# Patient Record
Sex: Male | Born: 1935 | Race: White | Hispanic: No | Smoking: Former smoker
Health system: Southern US, Community
[De-identification: ages and names within clinical notes are randomized; demographics above are authoritative.]

## PROBLEM LIST (undated history)

## (undated) DIAGNOSIS — I10 Essential (primary) hypertension: Secondary | ICD-10-CM

## (undated) DIAGNOSIS — C801 Malignant (primary) neoplasm, unspecified: Secondary | ICD-10-CM

## (undated) DIAGNOSIS — I219 Acute myocardial infarction, unspecified: Secondary | ICD-10-CM

## (undated) DIAGNOSIS — I739 Peripheral vascular disease, unspecified: Secondary | ICD-10-CM

## (undated) DIAGNOSIS — K819 Cholecystitis, unspecified: Secondary | ICD-10-CM

## (undated) DIAGNOSIS — K219 Gastro-esophageal reflux disease without esophagitis: Secondary | ICD-10-CM

## (undated) DIAGNOSIS — T8859XA Other complications of anesthesia, initial encounter: Secondary | ICD-10-CM

## (undated) DIAGNOSIS — M1990 Unspecified osteoarthritis, unspecified site: Secondary | ICD-10-CM

## (undated) DIAGNOSIS — T4145XA Adverse effect of unspecified anesthetic, initial encounter: Secondary | ICD-10-CM

## (undated) DIAGNOSIS — E119 Type 2 diabetes mellitus without complications: Secondary | ICD-10-CM

## (undated) HISTORY — DX: Gastro-esophageal reflux disease without esophagitis: K21.9

## (undated) HISTORY — DX: Other complications of anesthesia, initial encounter: T88.59XA

## (undated) HISTORY — DX: Acute myocardial infarction, unspecified: I21.9

## (undated) HISTORY — DX: Peripheral vascular disease, unspecified: I73.9

## (undated) HISTORY — DX: Malignant (primary) neoplasm, unspecified: C80.1

## (undated) HISTORY — DX: Unspecified osteoarthritis, unspecified site: M19.90

## (undated) HISTORY — DX: Type 2 diabetes mellitus without complications: E11.9

## (undated) HISTORY — PX: LEG AMPUTATION ABOVE KNEE: SHX117

## (undated) HISTORY — DX: Adverse effect of unspecified anesthetic, initial encounter: T41.45XA

## (undated) HISTORY — DX: Essential (primary) hypertension: I10

---

## 2014-04-16 DIAGNOSIS — I10 Essential (primary) hypertension: Secondary | ICD-10-CM | POA: Insufficient documentation

## 2014-09-15 ENCOUNTER — Encounter: Payer: Self-pay | Admitting: Podiatry

## 2014-09-15 ENCOUNTER — Ambulatory Visit (INDEPENDENT_AMBULATORY_CARE_PROVIDER_SITE_OTHER): Payer: Medicare Other | Admitting: Podiatry

## 2014-09-15 VITALS — BP 139/85 | HR 102 | Ht 70.0 in | Wt 195.0 lb

## 2014-09-15 DIAGNOSIS — L97402 Non-pressure chronic ulcer of unspecified heel and midfoot with fat layer exposed: Secondary | ICD-10-CM | POA: Diagnosis not present

## 2014-09-15 DIAGNOSIS — L089 Local infection of the skin and subcutaneous tissue, unspecified: Secondary | ICD-10-CM

## 2014-09-15 DIAGNOSIS — M79673 Pain in unspecified foot: Secondary | ICD-10-CM

## 2014-09-15 DIAGNOSIS — M2010 Hallux valgus (acquired), unspecified foot: Secondary | ICD-10-CM | POA: Diagnosis not present

## 2014-09-15 DIAGNOSIS — M21619 Bunion of unspecified foot: Secondary | ICD-10-CM | POA: Insufficient documentation

## 2014-09-15 DIAGNOSIS — L97409 Non-pressure chronic ulcer of unspecified heel and midfoot with unspecified severity: Secondary | ICD-10-CM | POA: Insufficient documentation

## 2014-09-15 MED ORDER — CEPHALEXIN 500 MG PO CAPS: 500.0000 mg | ORAL_CAPSULE | Freq: Three times a day (TID) | ORAL | Status: DC

## 2014-09-15 NOTE — Progress Notes (Signed)
Subjective: 81 year old male presents with painful callus under right foot. Stated that his blood sugar stays under 120.  He has had callus all his life. Then start having pain under right great toe joint x 4 days.  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 Lost 3rd digit last year and 2 years ago the 2nd toe right foot.   Objective: Dermatologic: Ulcerated and draining callus under 1st MPJ right foot. Surrounding callus is soft white about 2.5 cm in diameter. Center opening is about 1cm in diameter.  Vascular: right foot faintly palpable DP and PT, left foot not palpable and foot is cold.  Increased redness and temperature surrounding the right foot lesion.  Neurologic: All epicritic and tactile sensations grossly intact. 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 Lost 3rd digit right last year and 2nd digit 2 years ago.  Severe Hallux valgus with bunion on both feet.  Assessment: Infected and ulcerated callus on right first MPJ.   Plan: Debrided infected callus. Aperture pad applied. Betadine wet to dry dressing applied. Patient is to do daily dressing change with Amerigel ointment.  Keflex 500mg  tid.  Return next week.

## 2014-09-15 NOTE — Patient Instructions (Signed)
Seen for right foot lesion. Noted of infected ulcer draining at the right great toe joint. Area debrided and padded. Do apply antibiotics daily and return in one week.

## 2014-09-22 ENCOUNTER — Ambulatory Visit (INDEPENDENT_AMBULATORY_CARE_PROVIDER_SITE_OTHER): Payer: Medicare Other | Admitting: Podiatry

## 2014-09-22 ENCOUNTER — Encounter: Payer: Self-pay | Admitting: Podiatry

## 2014-09-22 VITALS — BP 134/78 | HR 96

## 2014-09-22 DIAGNOSIS — M21611 Bunion of right foot: Secondary | ICD-10-CM

## 2014-09-22 DIAGNOSIS — L97412 Non-pressure chronic ulcer of right heel and midfoot with fat layer exposed: Secondary | ICD-10-CM | POA: Diagnosis not present

## 2014-09-22 DIAGNOSIS — M2011 Hallux valgus (acquired), right foot: Secondary | ICD-10-CM | POA: Diagnosis not present

## 2014-09-22 NOTE — Patient Instructions (Signed)
Seen for right foot ulcer. Wound debrided and padded.  Keep the pad on till the day before the appointment and take good bath on Sunday nite. Return in one week.

## 2014-09-22 NOTE — Progress Notes (Signed)
Subjective: 81 year old male presents for the follow up on right foot ulcer. He has kept the dressing intact till today.  Objective: Dermatologic: 1st MPJ right foot ulcer is decreased in size. The center opening is about 0.5cm in diameter with surrounding dry hard calluses. Center is filled with devitalized tissue.  No redness or edema associated with the wound.  Vascular: right foot faintly palpable DP and PT, left foot not palpable and foot is cold.  Increased redness and temperature surrounding the right foot lesion.  Neurologic: All epicritic and tactile sensations grossly intact. 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 Lost 3rd digit right last year and 2nd digit 2 years ago.  Severe Hallux valgus with bunion on both feet.  Assessment: Improved ulcer right first MPJ.   Plan: Debrided devitalized tissue surrounding the open lesion.  Aperture pad applied. Return next week.

## 2014-09-29 ENCOUNTER — Encounter: Payer: Self-pay | Admitting: Podiatry

## 2014-09-29 ENCOUNTER — Ambulatory Visit (INDEPENDENT_AMBULATORY_CARE_PROVIDER_SITE_OTHER): Payer: Medicare Other | Admitting: Podiatry

## 2014-09-29 VITALS — BP 134/74 | HR 101

## 2014-09-29 DIAGNOSIS — L97401 Non-pressure chronic ulcer of unspecified heel and midfoot limited to breakdown of skin: Secondary | ICD-10-CM

## 2014-09-29 DIAGNOSIS — M2011 Hallux valgus (acquired), right foot: Secondary | ICD-10-CM

## 2014-09-29 DIAGNOSIS — M21611 Bunion of right foot: Secondary | ICD-10-CM

## 2014-09-29 NOTE — Patient Instructions (Signed)
Seen for follow up on right great toe ulcer. Slow healing. Continue daily dressing change,. Return in 2 weeks. Remove all bandage two days before the next appointment.

## 2014-09-29 NOTE — Progress Notes (Signed)
Subjective: 81 year old male presents for the follow up on right foot ulcer. Stated that he has followed the instruction.   Objective: Dermatologic: 1st MPJ right foot ulcer show no improvement. The center opening is about 0.5cm in diameter with surrounding dry hard calluses. Center is filled with devitalized tissue. No redness or edema associated with the wound.  Vascular: right foot faintly palpable DP and PT, left foot not palpable and foot is cold.  Increased redness and temperature surrounding the right foot lesion.  Neurologic: All epicritic and tactile sensations grossly intact. 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 Lost 3rd digit right last year and 2nd digit 2 years ago.  Severe Hallux valgus with bunion on both feet.  Assessment: No change in ulcer size since last visit, right first MPJ.   Plan: Debrided devitalized tissue surrounding the open lesion.  Aperture pad applied. Return in 2 weeks.

## 2014-10-13 ENCOUNTER — Encounter: Payer: Self-pay | Admitting: Podiatry

## 2014-10-13 ENCOUNTER — Ambulatory Visit (INDEPENDENT_AMBULATORY_CARE_PROVIDER_SITE_OTHER): Payer: Medicare Other | Admitting: Podiatry

## 2014-10-13 VITALS — BP 140/80 | HR 81

## 2014-10-13 DIAGNOSIS — I70209 Unspecified atherosclerosis of native arteries of extremities, unspecified extremity: Secondary | ICD-10-CM

## 2014-10-13 DIAGNOSIS — L98499 Non-pressure chronic ulcer of skin of other sites with unspecified severity: Secondary | ICD-10-CM | POA: Diagnosis not present

## 2014-10-13 DIAGNOSIS — L97402 Non-pressure chronic ulcer of unspecified heel and midfoot with fat layer exposed: Secondary | ICD-10-CM | POA: Diagnosis not present

## 2014-10-13 DIAGNOSIS — I739 Peripheral vascular disease, unspecified: Secondary | ICD-10-CM | POA: Diagnosis not present

## 2014-10-13 NOTE — Patient Instructions (Signed)
Seen for right foot ulcer. Slow healing wound. Return in 2 weeks.

## 2014-10-13 NOTE — Progress Notes (Signed)
Subjective: 81 year old male presents for the follow up on right foot ulcer. Stated that he has followed the instruction.  Stated that the foot is less painful.   Objective: Dermatologic: 1st MPJ right foot ulcer show slow improvement.  The center opening is about 0.4 cm in diameter with surrounding dry hard calluses. Center is filled with devitalized tissue. No redness or edema associated with the wound.  Vascular: right foot faintly palpable DP and PT, left foot not palpable and foot is cold.  Increased redness and temperature surrounding the right foot lesion.  Neurologic: All epicritic and tactile sensations grossly intact. 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 Lost 3rd digit right last year and 2nd digit 2 years ago.  Severe Hallux valgus with bunion on both feet.  Assessment: Small improvement in ulcer size since last visit, right first MPJ.   Plan: Debrided devitalized tissue surrounding the open lesion.  Aperture pad applied. Return in 2 weeks.

## 2014-10-27 ENCOUNTER — Encounter: Payer: Self-pay | Admitting: Podiatry

## 2014-10-27 ENCOUNTER — Ambulatory Visit (INDEPENDENT_AMBULATORY_CARE_PROVIDER_SITE_OTHER): Payer: Medicare Other | Admitting: Podiatry

## 2014-10-27 VITALS — BP 132/75 | HR 90

## 2014-10-27 DIAGNOSIS — I739 Peripheral vascular disease, unspecified: Secondary | ICD-10-CM

## 2014-10-27 DIAGNOSIS — I70209 Unspecified atherosclerosis of native arteries of extremities, unspecified extremity: Secondary | ICD-10-CM

## 2014-10-27 DIAGNOSIS — L97402 Non-pressure chronic ulcer of unspecified heel and midfoot with fat layer exposed: Secondary | ICD-10-CM

## 2014-10-27 DIAGNOSIS — L98499 Non-pressure chronic ulcer of skin of other sites with unspecified severity: Secondary | ICD-10-CM

## 2014-10-27 NOTE — Patient Instructions (Signed)
Seen for right foot ulcer. Continue do daily dressing change. Return in 2 weeks.

## 2014-10-27 NOTE — Progress Notes (Signed)
Subjective: 81 year old male presents for the follow up on right foot ulcer. Stated that he has followed the instruction and removed padding last week.   Objective: Dermatologic: 1st MPJ right foot ulcer show slow improvement.  The center opening is about 0.4 cm in diameter with surrounding dry hard calluses. Center is filled with devitalized tissue. No redness or edema associated with the wound.  Vascular: right foot faintly palpable DP and PT, left foot not palpable and foot is cold.  Increased redness and temperature surrounding the right foot lesion.  Neurologic: All epicritic and tactile sensations grossly intact. 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 Lost 3rd digit right last year and 2nd digit 2 years ago.  Severe Hallux valgus with bunion on both feet.  Assessment: Small improvement in ulcer size since last visit, right first MPJ.   Plan: Debrided devitalized tissue surrounding the open lesion.  Aperture pad applied. Return in 2 weeks.

## 2014-11-10 ENCOUNTER — Encounter: Payer: Self-pay | Admitting: Podiatry

## 2014-11-10 ENCOUNTER — Ambulatory Visit (INDEPENDENT_AMBULATORY_CARE_PROVIDER_SITE_OTHER): Payer: Medicare Other | Admitting: Podiatry

## 2014-11-10 VITALS — BP 125/73 | HR 97

## 2014-11-10 DIAGNOSIS — L98499 Non-pressure chronic ulcer of skin of other sites with unspecified severity: Secondary | ICD-10-CM | POA: Diagnosis not present

## 2014-11-10 DIAGNOSIS — I739 Peripheral vascular disease, unspecified: Secondary | ICD-10-CM

## 2014-11-10 DIAGNOSIS — L97401 Non-pressure chronic ulcer of unspecified heel and midfoot limited to breakdown of skin: Secondary | ICD-10-CM

## 2014-11-10 DIAGNOSIS — I70209 Unspecified atherosclerosis of native arteries of extremities, unspecified extremity: Secondary | ICD-10-CM

## 2014-11-10 NOTE — Patient Instructions (Signed)
Follow up on right foot lesion.  Slow healing ulcer.  Area debrided and padded. Return in 2 weeks.

## 2014-11-10 NOTE — Progress Notes (Signed)
Subjective: 81 year old male presents for the follow up on right foot ulcer. He is talkative for having headaches and eye problems.  Objective: Dermatologic: 1st MPJ right foot ulcer show slow improvement. Still draining in center opening which is about 0.3cm surrounded with dry calluses.  Vascular: right foot faintly palpable DP and PT, left foot not palpable and foot is cold.  Neurologic: All epicritic and tactile sensations grossly intact. 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 Lost 3rd digit right last year and 2nd digit 2 years ago.  Severe Hallux valgus with bunion on both feet.  Assessment: Slow improving ulcer, dorsomedial right first MPJ.   Plan: Debrided devitalized tissue surrounding the open lesion.  Aperture pad applied. Return in 2 weeks.

## 2014-11-24 ENCOUNTER — Ambulatory Visit: Payer: Medicare Other | Admitting: Podiatry

## 2015-03-13 ENCOUNTER — Encounter: Payer: Self-pay | Admitting: Podiatry

## 2015-03-13 ENCOUNTER — Ambulatory Visit (INDEPENDENT_AMBULATORY_CARE_PROVIDER_SITE_OTHER): Payer: Medicare Other | Admitting: Podiatry

## 2015-03-13 VITALS — BP 125/74 | HR 81

## 2015-03-13 DIAGNOSIS — L97401 Non-pressure chronic ulcer of unspecified heel and midfoot limited to breakdown of skin: Secondary | ICD-10-CM | POA: Diagnosis not present

## 2015-03-13 DIAGNOSIS — L98499 Non-pressure chronic ulcer of skin of other sites with unspecified severity: Secondary | ICD-10-CM | POA: Diagnosis not present

## 2015-03-13 DIAGNOSIS — M2011 Hallux valgus (acquired), right foot: Secondary | ICD-10-CM | POA: Diagnosis not present

## 2015-03-13 DIAGNOSIS — I739 Peripheral vascular disease, unspecified: Secondary | ICD-10-CM | POA: Diagnosis not present

## 2015-03-13 DIAGNOSIS — I70209 Unspecified atherosclerosis of native arteries of extremities, unspecified extremity: Secondary | ICD-10-CM

## 2015-03-13 DIAGNOSIS — M21611 Bunion of right foot: Secondary | ICD-10-CM

## 2015-03-13 NOTE — Progress Notes (Signed)
Subjective: 81 year old male presents with a concern on discolored toe 3rd and 5th right.  Also has had ulceration on right foot.   Objective: Dermatologic: 1st MPJ right foot ulcer remain open under callused tissue. Opening is about 0.3cm surrounded with dry calluses.  Vascular: right foot faintly palpable DP and PT, left foot not palpable and foot is cold.  Neurologic: All epicritic and tactile sensations grossly intact. 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 Lost 3rd digit right last year and 2nd digit 2 years ago.  Severe Hallux valgus with bunion on both feet.  Assessment: Bruised skin on right foot.  Chronic condition, slow improving ulcer, dorsomedial right first MPJ.   Plan: Debrided devitalized tissue surrounding the open lesion.  Aperture pad applied. Return in 3 weeks.

## 2015-03-13 NOTE — Patient Instructions (Signed)
Seen for darkened toe right foot. Noted of bruised 3rd and 5th toe right. Right first great toe joint still has opening ulcer. Debrided and padded. Return in 3 weeks.

## 2015-04-03 ENCOUNTER — Encounter: Payer: Self-pay | Admitting: Podiatry

## 2015-04-03 ENCOUNTER — Ambulatory Visit (INDEPENDENT_AMBULATORY_CARE_PROVIDER_SITE_OTHER): Payer: Medicare Other | Admitting: Podiatry

## 2015-04-03 VITALS — BP 122/77 | HR 99

## 2015-04-03 DIAGNOSIS — L97401 Non-pressure chronic ulcer of unspecified heel and midfoot limited to breakdown of skin: Secondary | ICD-10-CM | POA: Diagnosis not present

## 2015-04-03 DIAGNOSIS — I70209 Unspecified atherosclerosis of native arteries of extremities, unspecified extremity: Secondary | ICD-10-CM

## 2015-04-03 DIAGNOSIS — L98499 Non-pressure chronic ulcer of skin of other sites with unspecified severity: Secondary | ICD-10-CM | POA: Diagnosis not present

## 2015-04-03 DIAGNOSIS — I739 Peripheral vascular disease, unspecified: Secondary | ICD-10-CM | POA: Diagnosis not present

## 2015-04-03 NOTE — Progress Notes (Signed)
Subjective: 81 year old male presents for follow up on ulceration on right foot.   Objective: Dermatologic: 1st MPJ right foot ulcer remain open under callused tissue. Opening is about 0.3cm surrounded with dry calluses.  No active drainage.  Vascular: right foot faintly palpable DP and PT, left foot not palpable and foot is cold.  Neurologic: All epicritic and tactile sensations grossly intact. 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 Lost 3rd digit right last year and 2nd digit 2 years ago.  Severe Hallux valgus with bunion on both feet.  Assessment: Chronic condition, slow improving ulcer, dorsomedial right first MPJ.   Base open about 0.3cm in diameter with minimum drainage and no associated edema or erythema right foot.   Plan: Debrided devitalized tissue surrounding the open lesion.  Aperture pad applied. Return in 4 weeks.

## 2015-04-03 NOTE — Patient Instructions (Signed)
Seen for ulcerated lesion right first Metatarsal head. Debrided and padded. Return in one month.

## 2015-07-07 DIAGNOSIS — M064 Inflammatory polyarthropathy: Secondary | ICD-10-CM | POA: Insufficient documentation

## 2015-07-07 DIAGNOSIS — D126 Benign neoplasm of colon, unspecified: Secondary | ICD-10-CM | POA: Insufficient documentation

## 2015-07-07 DIAGNOSIS — I6529 Occlusion and stenosis of unspecified carotid artery: Secondary | ICD-10-CM | POA: Insufficient documentation

## 2015-07-07 DIAGNOSIS — E1142 Type 2 diabetes mellitus with diabetic polyneuropathy: Secondary | ICD-10-CM | POA: Insufficient documentation

## 2015-08-18 DIAGNOSIS — I6523 Occlusion and stenosis of bilateral carotid arteries: Secondary | ICD-10-CM | POA: Diagnosis not present

## 2015-08-19 DIAGNOSIS — Z5181 Encounter for therapeutic drug level monitoring: Secondary | ICD-10-CM | POA: Diagnosis not present

## 2015-08-19 DIAGNOSIS — Z7901 Long term (current) use of anticoagulants: Secondary | ICD-10-CM | POA: Diagnosis not present

## 2015-08-31 DIAGNOSIS — I6523 Occlusion and stenosis of bilateral carotid arteries: Secondary | ICD-10-CM | POA: Diagnosis not present

## 2015-08-31 DIAGNOSIS — Z7901 Long term (current) use of anticoagulants: Secondary | ICD-10-CM | POA: Diagnosis not present

## 2015-10-14 DIAGNOSIS — Z7901 Long term (current) use of anticoagulants: Secondary | ICD-10-CM | POA: Diagnosis not present

## 2015-10-14 DIAGNOSIS — Z5181 Encounter for therapeutic drug level monitoring: Secondary | ICD-10-CM | POA: Diagnosis not present

## 2015-10-14 DIAGNOSIS — I70221 Atherosclerosis of native arteries of extremities with rest pain, right leg: Secondary | ICD-10-CM | POA: Diagnosis not present

## 2015-10-27 DIAGNOSIS — L559 Sunburn, unspecified: Secondary | ICD-10-CM | POA: Diagnosis not present

## 2015-11-03 DIAGNOSIS — M47816 Spondylosis without myelopathy or radiculopathy, lumbar region: Secondary | ICD-10-CM | POA: Diagnosis not present

## 2015-11-03 DIAGNOSIS — L559 Sunburn, unspecified: Secondary | ICD-10-CM | POA: Diagnosis not present

## 2015-11-03 DIAGNOSIS — L570 Actinic keratosis: Secondary | ICD-10-CM | POA: Diagnosis not present

## 2015-11-03 DIAGNOSIS — I10 Essential (primary) hypertension: Secondary | ICD-10-CM | POA: Diagnosis not present

## 2015-11-03 DIAGNOSIS — E1140 Type 2 diabetes mellitus with diabetic neuropathy, unspecified: Secondary | ICD-10-CM | POA: Diagnosis not present

## 2015-12-03 DIAGNOSIS — I70221 Atherosclerosis of native arteries of extremities with rest pain, right leg: Secondary | ICD-10-CM | POA: Diagnosis not present

## 2015-12-03 DIAGNOSIS — Z5181 Encounter for therapeutic drug level monitoring: Secondary | ICD-10-CM | POA: Diagnosis not present

## 2015-12-03 DIAGNOSIS — Z7901 Long term (current) use of anticoagulants: Secondary | ICD-10-CM | POA: Diagnosis not present

## 2016-01-30 DIAGNOSIS — R52 Pain, unspecified: Secondary | ICD-10-CM | POA: Diagnosis not present

## 2016-01-30 DIAGNOSIS — J309 Allergic rhinitis, unspecified: Secondary | ICD-10-CM | POA: Diagnosis not present

## 2016-01-30 DIAGNOSIS — W57XXXA Bitten or stung by nonvenomous insect and other nonvenomous arthropods, initial encounter: Secondary | ICD-10-CM | POA: Diagnosis not present

## 2016-02-04 DIAGNOSIS — Z7901 Long term (current) use of anticoagulants: Secondary | ICD-10-CM | POA: Diagnosis not present

## 2016-02-04 DIAGNOSIS — I70221 Atherosclerosis of native arteries of extremities with rest pain, right leg: Secondary | ICD-10-CM | POA: Diagnosis not present

## 2016-02-04 DIAGNOSIS — Z5181 Encounter for therapeutic drug level monitoring: Secondary | ICD-10-CM | POA: Diagnosis not present

## 2016-02-04 DIAGNOSIS — R791 Abnormal coagulation profile: Secondary | ICD-10-CM | POA: Diagnosis not present

## 2016-02-15 DIAGNOSIS — E7800 Pure hypercholesterolemia, unspecified: Secondary | ICD-10-CM | POA: Diagnosis not present

## 2016-02-15 DIAGNOSIS — I70221 Atherosclerosis of native arteries of extremities with rest pain, right leg: Secondary | ICD-10-CM | POA: Diagnosis not present

## 2016-02-15 DIAGNOSIS — I70203 Unspecified atherosclerosis of native arteries of extremities, bilateral legs: Secondary | ICD-10-CM | POA: Diagnosis not present

## 2016-02-15 DIAGNOSIS — I700 Atherosclerosis of aorta: Secondary | ICD-10-CM | POA: Diagnosis not present

## 2016-02-15 DIAGNOSIS — H8113 Benign paroxysmal vertigo, bilateral: Secondary | ICD-10-CM | POA: Diagnosis not present

## 2016-02-15 DIAGNOSIS — R5383 Other fatigue: Secondary | ICD-10-CM | POA: Diagnosis not present

## 2016-02-15 DIAGNOSIS — E1140 Type 2 diabetes mellitus with diabetic neuropathy, unspecified: Secondary | ICD-10-CM | POA: Diagnosis not present

## 2016-02-15 DIAGNOSIS — Z95820 Peripheral vascular angioplasty status with implants and grafts: Secondary | ICD-10-CM | POA: Diagnosis not present

## 2016-02-15 DIAGNOSIS — I70209 Unspecified atherosclerosis of native arteries of extremities, unspecified extremity: Secondary | ICD-10-CM | POA: Diagnosis not present

## 2016-02-29 DIAGNOSIS — Z7901 Long term (current) use of anticoagulants: Secondary | ICD-10-CM | POA: Diagnosis not present

## 2016-02-29 DIAGNOSIS — E1140 Type 2 diabetes mellitus with diabetic neuropathy, unspecified: Secondary | ICD-10-CM | POA: Diagnosis not present

## 2016-02-29 DIAGNOSIS — Z89421 Acquired absence of other right toe(s): Secondary | ICD-10-CM | POA: Diagnosis not present

## 2016-02-29 DIAGNOSIS — I70203 Unspecified atherosclerosis of native arteries of extremities, bilateral legs: Secondary | ICD-10-CM | POA: Diagnosis not present

## 2016-03-03 DIAGNOSIS — Z5181 Encounter for therapeutic drug level monitoring: Secondary | ICD-10-CM | POA: Diagnosis not present

## 2016-03-03 DIAGNOSIS — I70203 Unspecified atherosclerosis of native arteries of extremities, bilateral legs: Secondary | ICD-10-CM | POA: Diagnosis not present

## 2016-03-03 DIAGNOSIS — Z7901 Long term (current) use of anticoagulants: Secondary | ICD-10-CM | POA: Diagnosis not present

## 2016-03-23 DIAGNOSIS — Z95828 Presence of other vascular implants and grafts: Secondary | ICD-10-CM | POA: Diagnosis not present

## 2016-03-23 DIAGNOSIS — R591 Generalized enlarged lymph nodes: Secondary | ICD-10-CM | POA: Diagnosis not present

## 2016-03-23 DIAGNOSIS — I70203 Unspecified atherosclerosis of native arteries of extremities, bilateral legs: Secondary | ICD-10-CM | POA: Diagnosis not present

## 2016-03-23 DIAGNOSIS — R238 Other skin changes: Secondary | ICD-10-CM | POA: Diagnosis not present

## 2016-03-23 DIAGNOSIS — Z8679 Personal history of other diseases of the circulatory system: Secondary | ICD-10-CM | POA: Diagnosis not present

## 2016-03-23 DIAGNOSIS — Z7901 Long term (current) use of anticoagulants: Secondary | ICD-10-CM | POA: Diagnosis not present

## 2016-03-26 DIAGNOSIS — R739 Hyperglycemia, unspecified: Secondary | ICD-10-CM | POA: Diagnosis not present

## 2016-03-31 DIAGNOSIS — Z7901 Long term (current) use of anticoagulants: Secondary | ICD-10-CM | POA: Diagnosis not present

## 2016-03-31 DIAGNOSIS — I739 Peripheral vascular disease, unspecified: Secondary | ICD-10-CM | POA: Diagnosis not present

## 2016-03-31 DIAGNOSIS — Z89421 Acquired absence of other right toe(s): Secondary | ICD-10-CM | POA: Diagnosis not present

## 2016-03-31 DIAGNOSIS — I70203 Unspecified atherosclerosis of native arteries of extremities, bilateral legs: Secondary | ICD-10-CM | POA: Diagnosis not present

## 2016-04-01 DIAGNOSIS — E1140 Type 2 diabetes mellitus with diabetic neuropathy, unspecified: Secondary | ICD-10-CM | POA: Diagnosis not present

## 2016-04-01 DIAGNOSIS — H8113 Benign paroxysmal vertigo, bilateral: Secondary | ICD-10-CM | POA: Diagnosis not present

## 2016-04-22 DIAGNOSIS — R791 Abnormal coagulation profile: Secondary | ICD-10-CM | POA: Diagnosis not present

## 2016-04-22 DIAGNOSIS — Z7901 Long term (current) use of anticoagulants: Secondary | ICD-10-CM | POA: Diagnosis not present

## 2016-04-22 DIAGNOSIS — Z5181 Encounter for therapeutic drug level monitoring: Secondary | ICD-10-CM | POA: Diagnosis not present

## 2016-04-22 DIAGNOSIS — I70203 Unspecified atherosclerosis of native arteries of extremities, bilateral legs: Secondary | ICD-10-CM | POA: Diagnosis not present

## 2016-05-06 DIAGNOSIS — Z7901 Long term (current) use of anticoagulants: Secondary | ICD-10-CM | POA: Diagnosis not present

## 2016-05-06 DIAGNOSIS — I70203 Unspecified atherosclerosis of native arteries of extremities, bilateral legs: Secondary | ICD-10-CM | POA: Diagnosis not present

## 2016-05-06 DIAGNOSIS — Z5181 Encounter for therapeutic drug level monitoring: Secondary | ICD-10-CM | POA: Diagnosis not present

## 2016-05-17 DIAGNOSIS — M79673 Pain in unspecified foot: Secondary | ICD-10-CM

## 2016-06-16 DIAGNOSIS — Z7901 Long term (current) use of anticoagulants: Secondary | ICD-10-CM | POA: Diagnosis not present

## 2016-06-16 DIAGNOSIS — Z5181 Encounter for therapeutic drug level monitoring: Secondary | ICD-10-CM | POA: Diagnosis not present

## 2016-06-16 DIAGNOSIS — R791 Abnormal coagulation profile: Secondary | ICD-10-CM | POA: Diagnosis not present

## 2016-06-16 DIAGNOSIS — I70203 Unspecified atherosclerosis of native arteries of extremities, bilateral legs: Secondary | ICD-10-CM | POA: Diagnosis not present

## 2016-06-28 DIAGNOSIS — E1140 Type 2 diabetes mellitus with diabetic neuropathy, unspecified: Secondary | ICD-10-CM | POA: Diagnosis not present

## 2016-06-30 ENCOUNTER — Ambulatory Visit (INDEPENDENT_AMBULATORY_CARE_PROVIDER_SITE_OTHER): Payer: PPO | Admitting: Podiatry

## 2016-06-30 ENCOUNTER — Encounter: Payer: Self-pay | Admitting: Podiatry

## 2016-06-30 VITALS — BP 139/58 | HR 91

## 2016-06-30 DIAGNOSIS — L97402 Non-pressure chronic ulcer of unspecified heel and midfoot with fat layer exposed: Secondary | ICD-10-CM | POA: Diagnosis not present

## 2016-06-30 DIAGNOSIS — M86171 Other acute osteomyelitis, right ankle and foot: Secondary | ICD-10-CM | POA: Diagnosis not present

## 2016-06-30 MED ORDER — AMOXICILLIN-POT CLAVULANATE 875-125 MG PO TABS: 1.0000 | ORAL_TABLET | Freq: Two times a day (BID) | ORAL | 0 refills | Status: DC

## 2016-06-30 MED ORDER — CEPHALEXIN 500 MG PO CAPS: 500.0000 mg | ORAL_CAPSULE | Freq: Three times a day (TID) | ORAL | 0 refills | Status: DC

## 2016-06-30 NOTE — Addendum Note (Signed)
Addended by: Camelia Phenes on: 06/30/2016 04:22 PM   Modules accepted: Orders

## 2016-06-30 NOTE — Patient Instructions (Signed)
Seen for infected right foot.  Drained pus and removed excess callused tissue over pus pocket under the first metatarsal head right foot. Take antibiotic as prescribed. Return in this Monday.

## 2016-06-30 NOTE — Progress Notes (Signed)
Subjective: 82 year old male presents with red inflamed right first metatarsal head with open callus and open lesion. The toe began hurting 2 days ago and now the foot is red and sore.  Objective: 1st MPJ right foot infected with open ulcer covered with thick bled callus, exposing metatarsal head bone. Positive of drainage, pus from the first metatarsal head plantar medial.  Vascular: Red inflamed right foot surrounding the first metatarsal head.Neurologic: All epicritic and tactile sensations grossly intact. 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 Lost 3rd digit right last year and 2nd digit 2 years ago.  Severe Hallux valgus with bunion on both feet.  Assessment: Ulcer first metatarsal head with bone exposure with pus drainage. Thick callus over the first metatarsal head.  Plan: Debrided devitalized tissue and drained pus. Wound cleansed with H2O2 and Iodine solution. Dressing applied. Placed in Surgical shoe with lateral padding.  Rx. Augumentin 875/125

## 2016-07-01 DIAGNOSIS — E1169 Type 2 diabetes mellitus with other specified complication: Secondary | ICD-10-CM | POA: Diagnosis not present

## 2016-07-01 DIAGNOSIS — E1140 Type 2 diabetes mellitus with diabetic neuropathy, unspecified: Secondary | ICD-10-CM | POA: Diagnosis not present

## 2016-07-01 DIAGNOSIS — I70203 Unspecified atherosclerosis of native arteries of extremities, bilateral legs: Secondary | ICD-10-CM | POA: Diagnosis not present

## 2016-07-01 DIAGNOSIS — E7800 Pure hypercholesterolemia, unspecified: Secondary | ICD-10-CM | POA: Diagnosis not present

## 2016-07-01 DIAGNOSIS — L089 Local infection of the skin and subcutaneous tissue, unspecified: Secondary | ICD-10-CM | POA: Diagnosis not present

## 2016-07-01 DIAGNOSIS — I10 Essential (primary) hypertension: Secondary | ICD-10-CM | POA: Diagnosis not present

## 2016-07-04 ENCOUNTER — Ambulatory Visit (INDEPENDENT_AMBULATORY_CARE_PROVIDER_SITE_OTHER): Payer: PPO | Admitting: Podiatry

## 2016-07-04 ENCOUNTER — Encounter: Payer: Self-pay | Admitting: Podiatry

## 2016-07-04 DIAGNOSIS — M86171 Other acute osteomyelitis, right ankle and foot: Secondary | ICD-10-CM

## 2016-07-04 DIAGNOSIS — L97402 Non-pressure chronic ulcer of unspecified heel and midfoot with fat layer exposed: Secondary | ICD-10-CM

## 2016-07-04 DIAGNOSIS — M21611 Bunion of right foot: Secondary | ICD-10-CM | POA: Diagnosis not present

## 2016-07-04 NOTE — Progress Notes (Signed)
Subjective: 82 year old male presents for follow up on right foot ulcer that was discovered on 06/28/16.  Stated that he is taking Antibiotics and kept dressing intact till last night.  He noted pain is less and redness is down. There is no active drainage.   Objective: Dermatologic: Open ulcer with bone exposure over right bunion with 0.5 cm open base covered with callus like white tissue. Vascular: No edema or erythema. Palpable pedal pulses. 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 Lost 3rd digit right last year and 2nd digit 2 years ago.  Severe Hallux valgus with bunion on both feet with fresh opening over right bunion.  Assessment: Ulcer first metatarsal head with bone subsided edema and erythema without drainage. Thick callus over the first metatarsal head.  Plan: Wound cleansed with H2O2 and Iodine solution. Aperture pad placed and dressing applied with Amerigel. Home care instruction given. Continue with Surgical shoe. Antibiotic changed to Keflex 500 mg tid after he notified that he had N&V after taking Augmentin.

## 2016-07-04 NOTE — Patient Instructions (Signed)
Improved ulcer right bunion site. Continue keep the foot off of weight bearing.  Wound care with felt pad done. Return in one weeks.

## 2016-07-11 ENCOUNTER — Ambulatory Visit (INDEPENDENT_AMBULATORY_CARE_PROVIDER_SITE_OTHER): Payer: PPO | Admitting: Podiatry

## 2016-07-11 ENCOUNTER — Encounter: Payer: Self-pay | Admitting: Podiatry

## 2016-07-11 DIAGNOSIS — M21611 Bunion of right foot: Secondary | ICD-10-CM | POA: Diagnosis not present

## 2016-07-11 DIAGNOSIS — L97401 Non-pressure chronic ulcer of unspecified heel and midfoot limited to breakdown of skin: Secondary | ICD-10-CM

## 2016-07-11 NOTE — Progress Notes (Signed)
Subjective: 82 year old male presents for follow up on right foot ulcer that was discovered on 06/28/16.  Stated that right foot pain increased this morning.   Objective: Dermatologic: Open ulcer with bone exposure over right bunion with 0.5 cm open base covered with white devitalized tissue. No active drainage or redness noted.  Vascular: No edema or erythema. Palpable pedal pulses. 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 Lost 3rd digit right last year and 2nd digit 2 years ago.  Severe Hallux valgus with bunion on both feet with fresh opening over right bunion.  Assessment: Ulcer first metatarsal head with bone exposure. Thick callus over the first metatarsal head ulcer.  Plan: Wound debrided and cleansed with H2O2. Aperture pad placed and dressing applied with Amerigel. Home care instruction to keep the pad and change dressing daily. Return in one week.

## 2016-07-11 NOTE — Patient Instructions (Signed)
Follow up on right foot ulcer. Slow healing. Keep the pad and do daily dressing change. Return in one week.

## 2016-07-18 ENCOUNTER — Ambulatory Visit (INDEPENDENT_AMBULATORY_CARE_PROVIDER_SITE_OTHER): Payer: PPO | Admitting: Podiatry

## 2016-07-18 DIAGNOSIS — L97412 Non-pressure chronic ulcer of right heel and midfoot with fat layer exposed: Secondary | ICD-10-CM

## 2016-07-18 DIAGNOSIS — M21611 Bunion of right foot: Secondary | ICD-10-CM | POA: Diagnosis not present

## 2016-07-18 DIAGNOSIS — L97402 Non-pressure chronic ulcer of unspecified heel and midfoot with fat layer exposed: Secondary | ICD-10-CM

## 2016-07-18 NOTE — Patient Instructions (Signed)
Follow up on right foot ulcer. Padding replaced. Keep the pad as long as possible and change center dressing with Amerigel ointment. Return in 10 days.

## 2016-07-18 NOTE — Progress Notes (Signed)
Subjective: 82year old male presents for follow up on right foot ulcer that was discovered on 06/28/16.  Stated that he noted more pain on right foot yesterday.   Objective: Dermatologic: Open ulcer with bone exposure over right bunion with 0.5 cm open base covered with white devitalized tissue. No active drainage or redness noted. No changes seen since the last visit.  Vascular: No edema or erythema. Palpable pedal pulses. 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 Lost 3rd digit right last year and 2nd digit 2 years ago.  Severe Hallux valgus with bunion on both feet with fresh opening over right bunion.  Assessment: Ulcer first metatarsal head with bone exposure. Thick callus over the first metatarsal head ulcer.  Plan: Wound debrided and cleansed with H2O2. Aperture pad placed and dressing applied with Amerigel. Home care instruction to keep the pad and change dressing daily. Return in one week.

## 2016-07-19 ENCOUNTER — Encounter: Payer: Self-pay | Admitting: Podiatry

## 2016-07-26 ENCOUNTER — Ambulatory Visit (INDEPENDENT_AMBULATORY_CARE_PROVIDER_SITE_OTHER): Payer: PPO | Admitting: Podiatry

## 2016-07-26 ENCOUNTER — Encounter: Payer: Self-pay | Admitting: Podiatry

## 2016-07-26 DIAGNOSIS — M21611 Bunion of right foot: Secondary | ICD-10-CM | POA: Diagnosis not present

## 2016-07-26 DIAGNOSIS — L97412 Non-pressure chronic ulcer of right heel and midfoot with fat layer exposed: Secondary | ICD-10-CM

## 2016-07-26 NOTE — Patient Instructions (Signed)
Follow up on right foot ulcer. Slow healing. Debrided and padded. Return in one week.

## 2016-07-26 NOTE — Progress Notes (Signed)
Subjective: 82year old male presents for follow up on right foot ulcer that was discovered on 06/28/16.   Objective: Dermatologic:  Open ulcer deep down to the metatarsal head at bunion area with 0.5 cm open base covered with white devitalized tissue.  No active drainage or redness noted. No changes seen since the last visit.  Vascular: No edema or erythema. Palpable pedal pulses. 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 Lost 3rd digit right last year and 2nd digit 2 years ago.  Severe Hallux valgus with bunion on both feet with fresh opening over right bunion.  Assessment: Ulcer first metatarsal head deep down to bony surface, 0.5 cm in diameter.  Thick callus over the first metatarsal head ulcer.  Plan: Wound debrided and cleansed with H2O2. Aperture pad placed and dressing applied with Amerigel. Home care instruction to keep the pad and change dressing daily. Return in one week.

## 2016-07-28 DIAGNOSIS — Z7901 Long term (current) use of anticoagulants: Secondary | ICD-10-CM | POA: Diagnosis not present

## 2016-07-28 DIAGNOSIS — I70203 Unspecified atherosclerosis of native arteries of extremities, bilateral legs: Secondary | ICD-10-CM | POA: Diagnosis not present

## 2016-07-28 DIAGNOSIS — Z5181 Encounter for therapeutic drug level monitoring: Secondary | ICD-10-CM | POA: Diagnosis not present

## 2016-08-02 ENCOUNTER — Ambulatory Visit (INDEPENDENT_AMBULATORY_CARE_PROVIDER_SITE_OTHER): Payer: PPO | Admitting: Podiatry

## 2016-08-02 ENCOUNTER — Encounter: Payer: Self-pay | Admitting: Podiatry

## 2016-08-02 DIAGNOSIS — L97412 Non-pressure chronic ulcer of right heel and midfoot with fat layer exposed: Secondary | ICD-10-CM

## 2016-08-02 DIAGNOSIS — M21611 Bunion of right foot: Secondary | ICD-10-CM | POA: Diagnosis not present

## 2016-08-02 DIAGNOSIS — M86171 Other acute osteomyelitis, right ankle and foot: Secondary | ICD-10-CM | POA: Diagnosis not present

## 2016-08-02 NOTE — Progress Notes (Signed)
Subjective: 83year old male presents for follow up on right foot ulcer that was discovered on 06/28/16.  He was advised to stay off of feet as much as possible. Stated that he lives along and cannot help but being on feet.   Objective: Dermatologic:  Open ulcer deep down to the metatarsal head at bunion area with 0.5 cm open base covered with white devitalized tissue.  Noted of minimum change since the last visit.  No active drainage or redness noted. No changes seen since the last visit.  Vascular: No edema or erythema.  Right foot pedal pulses were not palpable. 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 Lost 3rd digit right last year and 2nd digit 2 years ago.  Due to missing 2nd and 3rd digit right, the great toe has severely subluxed to latera direction with protruding bunion and open ulcer.  Severe Hallux valgus with bunion on both feet with open ulcer over right bunion.  Assessment: Slow healing ulcer first metatarsal head deep down to bony surface, 0.5 cm in diameter.  Thick callus over the first metatarsal head ulcer.  Plan: Wound debrided and cleansed with H2O2. Aperture pad placed and dressing applied with Amerigel. 1/2 " felt pad placed over surgical shoe liner at lateral column. Home care instruction to keep the pad and change dressing daily. Return in one week.

## 2016-08-02 NOTE — Patient Instructions (Signed)
Seen for follow up on right foot ulcer. Minimum change. Aperture pad placed and Amerigel dressing applied. Do daily dressing change with Amerigel ointment. Return in one week.

## 2016-08-09 ENCOUNTER — Ambulatory Visit: Payer: PPO | Admitting: Podiatry

## 2016-08-25 DIAGNOSIS — E1140 Type 2 diabetes mellitus with diabetic neuropathy, unspecified: Secondary | ICD-10-CM | POA: Diagnosis not present

## 2016-08-25 DIAGNOSIS — Z7901 Long term (current) use of anticoagulants: Secondary | ICD-10-CM | POA: Diagnosis not present

## 2016-08-25 DIAGNOSIS — I10 Essential (primary) hypertension: Secondary | ICD-10-CM | POA: Diagnosis not present

## 2016-08-25 DIAGNOSIS — E7800 Pure hypercholesterolemia, unspecified: Secondary | ICD-10-CM | POA: Diagnosis not present

## 2016-08-25 DIAGNOSIS — I6523 Occlusion and stenosis of bilateral carotid arteries: Secondary | ICD-10-CM | POA: Diagnosis not present

## 2016-09-06 ENCOUNTER — Encounter: Payer: Self-pay | Admitting: Podiatry

## 2016-09-06 ENCOUNTER — Ambulatory Visit (INDEPENDENT_AMBULATORY_CARE_PROVIDER_SITE_OTHER): Payer: PPO | Admitting: Podiatry

## 2016-09-06 DIAGNOSIS — M21611 Bunion of right foot: Secondary | ICD-10-CM

## 2016-09-06 DIAGNOSIS — I70235 Atherosclerosis of native arteries of right leg with ulceration of other part of foot: Secondary | ICD-10-CM

## 2016-09-06 NOTE — Patient Instructions (Signed)
Follow up on right foot ulcer. Noted of increase ulcer size. Wound debrided and aperture pad dressing applied. Need to be seen by vascular doctor to restore circulation. Then we could reduce protruding bone to heal the wound. Return in one week.

## 2016-09-06 NOTE — Progress Notes (Signed)
Subjective: 83year old NIDDM male presents for follow up on right foot ulcer that was discovered on 06/28/16.  He was seen weekly for the ulcer care. The wound was maintaining the same size without further regression.  His last visit was in 08/02/16. He failed weekly appointment and returned in month later with a statement that he has noted some drainage and wanted to be checked out further. He report his blood sugar is under control.  Also stated that he was seen by his vascular specialist last week with confirmation of blockage in lower limbs.  Objective: Dermatologic:  Open ulcer deep down to the metatarsal head at bunion area with 1cm open granular base covered with white devitalized tissue.  Mild serosanguinous wound base without associated edema or erythema. Vascular: No edema or erythema.  Right foot pedal pulses were not palpable. 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 Lost 3rd digit right last year and 2nd digit 2 years ago.  Due to missing 2nd and 3rd digit right, the great toe has severely subluxed to latera direction with protruding bunion and open ulcer.  Severe Hallux valgus with bunion on both feet with open ulcer over right bunion.  Assessment: Enlarged ulcer size since last visit at the first metatarsal head deep down to bony surface, 1 cm in diameter.  Ulcer is covered with devitalized keratotic layers. PVD with slim hope wound healing on right foot without restoration of vascular status.   Plan: Wound debrided and cleansed with H2O2. Aperture pad placed and dressing applied with Amerigel. Patient will continue with daily dressing change. Reviewed current condition of right foot ulcer that needs to have more aggressive intervention; Vascular restoration, reduce offending bone mass, continue wound care. Patient will go back to Dr. Dedra Skeens to discuss treatment options for poor circulation.

## 2016-09-08 DIAGNOSIS — I70203 Unspecified atherosclerosis of native arteries of extremities, bilateral legs: Secondary | ICD-10-CM | POA: Diagnosis not present

## 2016-09-08 DIAGNOSIS — Z7901 Long term (current) use of anticoagulants: Secondary | ICD-10-CM | POA: Diagnosis not present

## 2016-09-08 DIAGNOSIS — Z5181 Encounter for therapeutic drug level monitoring: Secondary | ICD-10-CM | POA: Diagnosis not present

## 2016-09-13 ENCOUNTER — Ambulatory Visit (INDEPENDENT_AMBULATORY_CARE_PROVIDER_SITE_OTHER): Payer: PPO | Admitting: Podiatry

## 2016-09-13 DIAGNOSIS — L97402 Non-pressure chronic ulcer of unspecified heel and midfoot with fat layer exposed: Secondary | ICD-10-CM | POA: Diagnosis not present

## 2016-09-13 DIAGNOSIS — M21611 Bunion of right foot: Secondary | ICD-10-CM | POA: Diagnosis not present

## 2016-09-13 DIAGNOSIS — M86171 Other acute osteomyelitis, right ankle and foot: Secondary | ICD-10-CM

## 2016-09-13 NOTE — Progress Notes (Signed)
Subjective: 83year old NIDDM male presents for follow up on right foot ulcer that was discovered on 06/28/16.   HPI: He was seen weekly for the ulcer care. The wound was maintaining the same size without further regression.  His last visit was in 08/02/16. He failed weekly appointment and returned in month later with a statement that he has noted some drainage and wanted to be checked out further. He report his blood sugar is under control.  Also stated that he was seen by his vascular specialist last week with confirmation of blockage in lower limbs.  Objective: Dermatologic:  Open ulcer deep down to the metatarsal head at bunion area with 1cm open granular base covered with white devitalized tissue.  Mild serosanguinous wound base without associated edema or erythema. Vascular: No edema or erythema.  Right foot Dorsalis pedis artery was palpable on this day. 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 Lost 3rd digit right last year and 2nd digit 2 years ago.  Due to missing 2nd and 3rd digit right, the great toe has severely subluxed to latera direction with protruding bunion and open ulcer.  Severe Hallux valgus with bunion on both feet with open ulcerover right bunion.  Assessment: Failed to improve right foot ulcer at the first metatarsal head deep down to bony surface, 1 cm in diameter.  Ulcer is covered with devitalized keratotic layers. PVD with slim hope wound healing on right foot without restoration of vascular status.   Plan: Wound debrided and cleansed with H2O2. Aperture pad placed and dressing applied with Amerigel. Patient will continue with daily dressing change. Patient understands that the surgery is needed to reduce size of bone at the ulcer site. He does not have anyone to care for him to have surgery at this time.

## 2016-09-13 NOTE — Patient Instructions (Signed)
Follow up on right bunion ulcer. Dressing placed with Aperture pad.  Keep the pad for a week. Remove before returning next week.

## 2016-09-14 ENCOUNTER — Encounter: Payer: Self-pay | Admitting: Podiatry

## 2016-09-20 ENCOUNTER — Ambulatory Visit (INDEPENDENT_AMBULATORY_CARE_PROVIDER_SITE_OTHER): Payer: PPO | Admitting: Podiatry

## 2016-09-20 ENCOUNTER — Encounter: Payer: Self-pay | Admitting: Podiatry

## 2016-09-20 DIAGNOSIS — L97514 Non-pressure chronic ulcer of other part of right foot with necrosis of bone: Secondary | ICD-10-CM | POA: Diagnosis not present

## 2016-09-20 DIAGNOSIS — M21611 Bunion of right foot: Secondary | ICD-10-CM | POA: Diagnosis not present

## 2016-09-20 NOTE — Patient Instructions (Signed)
Seen for follow up on right foot ulcer. Debrided and padded. Need surgical intervention pending assurance of blood flow on right lower limb. Return in one week.

## 2016-09-20 NOTE — Progress Notes (Signed)
Subjective: 83year old NIDDM male presents for follow up on right foot ulcer that was discovered on 06/28/16.  Stated that he does not have anyone to care for him if he goes under foot surgery.  Stated that he has an appointment to see Dr. Maryjean Morn, vascular specialist to check on right foot circulation.   HPI: He was seen weekly for the ulcer care. The wound was maintaining the same size without further regression.  His last visit was in 08/02/16. He failed weekly appointment and returned in month later with a statement that he has noted some drainage and wanted to be checked out further. He report his blood sugar is under control.  Also stated that he was seen by his vascular specialist last week with confirmation of blockage in lower limbs.  Objective: Dermatologic:  No change in right foot ulcer over the bunion site, deep down to the metatarsal head with bone exposure. Mild serosanguinous wound base without associated edema or erythema. Vascular: No edema or erythema.  Right foot Dorsalis pedis artery was faintly palpable on this day. 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 Lost 3rd digit right last year and 2nd digit 2 years ago.  Due to missing 2nd and 3rd digit right, the great toe has severely subluxed to latera direction with protruding bunion and open ulcer.  Severe Hallux valgus with bunion on both feet with open ulcerover right bunion.  Assessment: Failed to improve right foot ulcer at the first metatarsal head with bone exposure. Ulcer is covered with devitalized keratotic layers. PVD with slim hope wound healing on right foot without restoration of vascular status.   Plan: Wound debrided and cleansed with H2O2. Aperture pad placed and dressing applied with Amerigel. Patient will continue with daily dressing change. Patient understands that the surgery is needed to reduce size of bone at the ulcer site. He does not have anyone to care for him to have  surgery at this time. He will continue to make contact to his relatives and neighbors.

## 2016-09-22 DIAGNOSIS — I70203 Unspecified atherosclerosis of native arteries of extremities, bilateral legs: Secondary | ICD-10-CM | POA: Diagnosis not present

## 2016-09-22 DIAGNOSIS — Z95820 Peripheral vascular angioplasty status with implants and grafts: Secondary | ICD-10-CM | POA: Diagnosis not present

## 2016-09-22 DIAGNOSIS — I70391 Other atherosclerosis of unspecified type of bypass graft(s) of the extremities, right leg: Secondary | ICD-10-CM | POA: Diagnosis not present

## 2016-09-22 DIAGNOSIS — I70201 Unspecified atherosclerosis of native arteries of extremities, right leg: Secondary | ICD-10-CM | POA: Diagnosis not present

## 2016-09-22 DIAGNOSIS — Z7901 Long term (current) use of anticoagulants: Secondary | ICD-10-CM | POA: Diagnosis not present

## 2016-09-27 ENCOUNTER — Encounter: Payer: Self-pay | Admitting: Podiatry

## 2016-09-27 ENCOUNTER — Ambulatory Visit (INDEPENDENT_AMBULATORY_CARE_PROVIDER_SITE_OTHER): Payer: PPO | Admitting: Podiatry

## 2016-09-27 DIAGNOSIS — M86171 Other acute osteomyelitis, right ankle and foot: Secondary | ICD-10-CM

## 2016-09-27 DIAGNOSIS — L97412 Non-pressure chronic ulcer of right heel and midfoot with fat layer exposed: Secondary | ICD-10-CM

## 2016-09-27 NOTE — Patient Instructions (Signed)
Seen for right foot ulcer. Slow progress without change. Aperture pad with toe separator placed and dressing changed. Return in one week.

## 2016-09-27 NOTE — Progress Notes (Signed)
Subjective: 83year old NIDDM male presents for follow up on right foot ulcer that was discovered on 06/28/16.  Stated that he will keep pressure off of foot by wearing open toed surgical shoe and avoid surgery as long as possible.   Informed him that I spoke with Dr. Maryjean Morn, vascular specialist who expressed concern if any surgery is performed due to high risk cardiovascular condition. Dr. Maryjean Morn also stated that right lower limb vascularity is patent at this time.  HPI: He was seen weekly for the ulcer care. The wound was maintaining the same size without further regression.  His last visit was in 08/02/16. He failed weekly appointment and returned in month later with a statement that he has noted some drainage and wanted to be checked out further. He report his blood sugar is under control.   Objective: Dermatologic:  No change in right foot ulcer over the bunion site, 0.5 cm opening with bone exposure to the head of the first metatarsal bone. Mild serosanguinous wound base without associated edema or erythema. Vascular: No edema or erythema.  Right foot Dorsalis pedis artery was faintly palpable on this day. 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 Lost 3rd digit right last year and 2nd digit 2 years ago.  Due to missing 2nd and 3rd digit right, the great toe has severely subluxed to latera direction with protruding bunion and open ulcer.  Severe Hallux valgus with bunion on both feet with open ulcerover right bunion.  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 Failed to improve right foot ulcer at the first metatarsal head with bone exposure. Right first MPJ ulcer, 0.5 cm opening is covered with devitalized keratotic layers.  Plan: Wound debrided and cleansed with H2O2. Aperture pad placed and dressing applied with Amerigel. Patient will keep the pad for several days and will change before the next appointment.

## 2016-09-29 DIAGNOSIS — Z0000 Encounter for general adult medical examination without abnormal findings: Secondary | ICD-10-CM | POA: Diagnosis not present

## 2016-09-29 DIAGNOSIS — L219 Seborrheic dermatitis, unspecified: Secondary | ICD-10-CM | POA: Diagnosis not present

## 2016-09-29 DIAGNOSIS — L089 Local infection of the skin and subcutaneous tissue, unspecified: Secondary | ICD-10-CM | POA: Diagnosis not present

## 2016-09-29 DIAGNOSIS — E1140 Type 2 diabetes mellitus with diabetic neuropathy, unspecified: Secondary | ICD-10-CM | POA: Diagnosis not present

## 2016-09-29 DIAGNOSIS — I70203 Unspecified atherosclerosis of native arteries of extremities, bilateral legs: Secondary | ICD-10-CM | POA: Diagnosis not present

## 2016-09-29 DIAGNOSIS — K5909 Other constipation: Secondary | ICD-10-CM | POA: Diagnosis not present

## 2016-09-29 DIAGNOSIS — E11628 Type 2 diabetes mellitus with other skin complications: Secondary | ICD-10-CM | POA: Diagnosis not present

## 2016-10-04 ENCOUNTER — Encounter: Payer: Self-pay | Admitting: Podiatry

## 2016-10-04 ENCOUNTER — Ambulatory Visit (INDEPENDENT_AMBULATORY_CARE_PROVIDER_SITE_OTHER): Payer: PPO | Admitting: Podiatry

## 2016-10-04 DIAGNOSIS — I70235 Atherosclerosis of native arteries of right leg with ulceration of other part of foot: Secondary | ICD-10-CM

## 2016-10-04 DIAGNOSIS — M21611 Bunion of right foot: Secondary | ICD-10-CM | POA: Diagnosis not present

## 2016-10-04 DIAGNOSIS — L97401 Non-pressure chronic ulcer of unspecified heel and midfoot limited to breakdown of skin: Secondary | ICD-10-CM

## 2016-10-04 NOTE — Progress Notes (Signed)
Subjective: 83year old NIDDM male presents for follow up on right foot ulcer that was discovered on 06/28/16.  He kept the dressing till yesterday. He is on surgical shoe.  HPI: He was seen weekly for the ulcer care. The wound was maintaining the same size without further regression.  His last visit was in 08/02/16. He failed weekly appointment and returned in month later with a statement that he has noted some drainage and wanted to be checked out further. He report his blood sugar is under control.   Objective: Dermatologic:  Clean right foot ulcer over the bunion site, 0.5 cm opening with red granulation at the base. Minimum drainage after removal of necrotic surface. Vascular: No edema or erythema.  Right foot Dorsalis pedis artery was faintly palpable on this day. 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 Lost 3rd digit right last year and 2nd digit 2 years ago.  Due to missing 2nd and 3rd digit right, the great toe has severely subluxed to latera direction with protruding bunion and open ulcer.  Severe Hallux valgus with bunion on both feet with open ulcerover right bunion.  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 Failed to improve right foot ulcer at the first metatarsal head with bone exposure. Right first MPJ ulcer, 0.5 cm opening with granulating base covered with devitalized keratotic layers.  Plan: Wound debrided and cleansed with H2O2. Aperture pad placed and dressing applied with Amerigel. Patient will keep the pad for several days and will change before the next appointment.

## 2016-10-04 NOTE — Patient Instructions (Signed)
Continue current level of care. Keep the dressing on till the next appointment.

## 2016-10-11 ENCOUNTER — Encounter: Payer: Self-pay | Admitting: Podiatry

## 2016-10-11 ENCOUNTER — Ambulatory Visit (INDEPENDENT_AMBULATORY_CARE_PROVIDER_SITE_OTHER): Payer: PPO | Admitting: Podiatry

## 2016-10-11 DIAGNOSIS — L97514 Non-pressure chronic ulcer of other part of right foot with necrosis of bone: Secondary | ICD-10-CM

## 2016-10-11 DIAGNOSIS — M21611 Bunion of right foot: Secondary | ICD-10-CM | POA: Diagnosis not present

## 2016-10-11 NOTE — Progress Notes (Signed)
Subjective: 83year old NIDDM male presents for follow up on right foot ulcer that was discovered on 06/28/16.  He kept the dressing till yesterday. He is wearing his regular shoe with side cut out to accommodate protruding bunion site.  HPI: He was seen weekly for the ulcer care. The wound was maintaining the same size without further regression.  His last visit was in 08/02/16. He failed weekly appointment and returned in month later with a statement that he has noted some drainage and wanted to be checked out further. He report his blood sugar is under control.   Objective: Dermatologic: No changes seen since the last visit. Clean right foot ulcer over the bunion site, 0.5 cm opening with red granulation at the base. Minimum drainage after removal of necrotic surface. Vascular: No edema or erythema.  Right foot Dorsalis pedis artery was faintly palpable on this day. 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 Lost 3rd digit right last year and 2nd digit 2 years ago.  Due to missing 2nd and 3rd digit right, the great toe has severely subluxed to latera direction with protruding bunion and open ulcer.  Severe Hallux valgus with bunion on both feet with open ulcerover right bunion.  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 Failed to improve right foot ulcer at the first metatarsal head with bone exposure. Right first MPJ ulcer, 0.5 cm opening with granulating base covered with devitalized keratotic layers.  Plan: Wound debrided and cleansed with H2O2. Aperture pad placed and dressing applied with Amerigel.

## 2016-10-11 NOTE — Patient Instructions (Signed)
Seen for left right foot ulcer under bunion area. Necrotic tissue debrided and padded. Keep the pad and remove the day before next appointment. Stay off of feet.  Return in one week.

## 2016-10-18 ENCOUNTER — Ambulatory Visit: Payer: PPO | Admitting: Podiatry

## 2016-10-20 DIAGNOSIS — R791 Abnormal coagulation profile: Secondary | ICD-10-CM | POA: Diagnosis not present

## 2016-10-20 DIAGNOSIS — Z5181 Encounter for therapeutic drug level monitoring: Secondary | ICD-10-CM | POA: Diagnosis not present

## 2016-10-20 DIAGNOSIS — I70203 Unspecified atherosclerosis of native arteries of extremities, bilateral legs: Secondary | ICD-10-CM | POA: Diagnosis not present

## 2016-10-20 DIAGNOSIS — Z7901 Long term (current) use of anticoagulants: Secondary | ICD-10-CM | POA: Diagnosis not present

## 2016-11-02 ENCOUNTER — Ambulatory Visit (INDEPENDENT_AMBULATORY_CARE_PROVIDER_SITE_OTHER): Payer: PPO | Admitting: Podiatry

## 2016-11-02 DIAGNOSIS — M86171 Other acute osteomyelitis, right ankle and foot: Secondary | ICD-10-CM

## 2016-11-02 DIAGNOSIS — M21611 Bunion of right foot: Secondary | ICD-10-CM

## 2016-11-03 ENCOUNTER — Encounter: Payer: Self-pay | Admitting: Podiatry

## 2016-11-03 NOTE — Progress Notes (Signed)
Subjective: 83year old NIDDM male walked in without appointment complaining of right foot ulcer that is still draining. Patient failed to return 2 week ago for weekly visit on none healing right foot ulcer.  HPI: On going right foot ulcer over the protruding bunion. The lesion is not improving due to severe bunion deformity. Possible surgical intervention was discussed with his circulation doctor who recommended palliative care as long as possible due to high risk of CVA.  Patient also reluctant to have surgery due to a posibility of loosing the foot if fail to heal.  The wound care, debridement and aperture padding was provided weekly without seeing any improvement on the wound size. He report his blood sugar is under control.   Objective: Dermatologic: Open ulcer over the bunion site, 0.8 cm opening with yellow necrotic tissue base. Wound size increased from 0.5 cm since last visit. Serosanguinous drainage from the wound. Vascular: No proximal or expanded edema or erythema noted. Right foot Dorsalis pedis artery was faintly palpable off and on. Orthopedic: History of multiple digital amputation 1-5 left in 2001. Lost 3rd digit right last year and 2nd digit 2 years ago.  Due to missing 2nd and 3rd digit right, the great toe has severely subluxed to latera direction with protruding bunion and open ulcer.  Severe Hallux valgus with bunion on both feet with open ulcerover right bunion.  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 Failed to improve right foot ulcer at the first metatarsal head with bone exposure. Right first MPJ ulcer, 0.8 cm opening covered with devitalized keratotic layers, with possible bone infection.  Plan: Wound debrided and cleansed with H2O2. Aperture pad placed and dressing applied with Amerigel. Daily Antibiotic foot soak with every other office visit discussed.  Will contact [place] to have the supplies delivered to  his house.

## 2016-11-03 NOTE — Patient Instructions (Signed)
Wound care done. Will start on a new foot soak program that is offered by [facility]. Reviewed with patient. Return in 2 weeks.

## 2016-11-10 DIAGNOSIS — M21611 Bunion of right foot: Secondary | ICD-10-CM | POA: Diagnosis not present

## 2016-11-10 DIAGNOSIS — M86171 Other acute osteomyelitis, right ankle and foot: Secondary | ICD-10-CM | POA: Diagnosis not present

## 2016-11-16 ENCOUNTER — Ambulatory Visit (INDEPENDENT_AMBULATORY_CARE_PROVIDER_SITE_OTHER): Payer: PPO | Admitting: Podiatry

## 2016-11-16 DIAGNOSIS — I70235 Atherosclerosis of native arteries of right leg with ulceration of other part of foot: Secondary | ICD-10-CM | POA: Diagnosis not present

## 2016-11-16 DIAGNOSIS — L97514 Non-pressure chronic ulcer of other part of right foot with necrosis of bone: Secondary | ICD-10-CM

## 2016-11-16 DIAGNOSIS — M21619 Bunion of unspecified foot: Secondary | ICD-10-CM | POA: Diagnosis not present

## 2016-11-16 NOTE — Patient Instructions (Signed)
Follow up on right foot ulcer. Devitalized tissue debrided and dressed. Toe spacer placed. Continue with antibiotic foot soak and daily dressing change according to the instruction. Return in 2 weeks.

## 2016-11-16 NOTE — Progress Notes (Signed)
Subjective: 83year old NIDDM male presents for follow up on right foot ulcer. Stated that he received antibiotic foot soak supplies and doing daily soaking and dressing change.   HPI: On going right foot ulcer over the protruding bunion. The lesion is not improving due to severe bunion deformity. Possible surgical intervention was discussed with his circulation doctor who recommended palliative care as long as possible due to high risk of CVA.  Patient also reluctant to have surgery due to a posibility of loosing the foot if fail to heal.  The wound care, debridement and aperture padding was provided weekly without seeing any improvement on the wound size. He report his blood sugar is under control.   Objective: Dermatologic: Open ulcer over the bunion site, 0.8 cm opening with yellow necrotic tissue base.  No drainage noted. No edema or erythema noted. Vascular: No proximal or expanded edema or erythema noted. Right foot Dorsalis pedis artery was faintly palpable off and on. Orthopedic: History of multiple digital amputation 1-5 left in 2001. Lost 3rd digit right last year and 2nd digit 2 years ago.  Due to missing 2nd and 3rd digit right, the great toe has severely subluxed to latera direction with protruding bunion and open ulcer.  Severe Hallux valgus with bunion on both feet with open ulcerover right bunion.  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 Failed to improve right foot ulcer at the first metatarsal head with bone exposure. Right first MPJ ulcer, 0.8 cm opening covered with devitalized keratotic layers, with possible bone infection.  Plan: Wound debrided and cleansed with H2O2. Aperture pad placed and dressing applied with Amerigel. Continue with daily Antibiotic foot soak as instructed by the supplier. Return in 2 weeks.

## 2016-11-17 ENCOUNTER — Encounter: Payer: Self-pay | Admitting: Podiatry

## 2016-11-30 ENCOUNTER — Ambulatory Visit: Payer: PPO | Admitting: Podiatry

## 2016-12-01 DIAGNOSIS — R531 Weakness: Secondary | ICD-10-CM | POA: Diagnosis not present

## 2016-12-01 DIAGNOSIS — I70203 Unspecified atherosclerosis of native arteries of extremities, bilateral legs: Secondary | ICD-10-CM | POA: Diagnosis not present

## 2016-12-01 DIAGNOSIS — Z6827 Body mass index (BMI) 27.0-27.9, adult: Secondary | ICD-10-CM | POA: Diagnosis not present

## 2016-12-01 DIAGNOSIS — Z881 Allergy status to other antibiotic agents status: Secondary | ICD-10-CM | POA: Diagnosis not present

## 2016-12-01 DIAGNOSIS — I10 Essential (primary) hypertension: Secondary | ICD-10-CM | POA: Diagnosis not present

## 2016-12-01 DIAGNOSIS — R778 Other specified abnormalities of plasma proteins: Secondary | ICD-10-CM | POA: Diagnosis not present

## 2016-12-01 DIAGNOSIS — R29898 Other symptoms and signs involving the musculoskeletal system: Secondary | ICD-10-CM | POA: Diagnosis not present

## 2016-12-01 DIAGNOSIS — F329 Major depressive disorder, single episode, unspecified: Secondary | ICD-10-CM | POA: Diagnosis not present

## 2016-12-01 DIAGNOSIS — Z95820 Peripheral vascular angioplasty status with implants and grafts: Secondary | ICD-10-CM | POA: Diagnosis not present

## 2016-12-01 DIAGNOSIS — Z87891 Personal history of nicotine dependence: Secondary | ICD-10-CM | POA: Diagnosis not present

## 2016-12-01 DIAGNOSIS — R079 Chest pain, unspecified: Secondary | ICD-10-CM | POA: Diagnosis not present

## 2016-12-01 DIAGNOSIS — Z5181 Encounter for therapeutic drug level monitoring: Secondary | ICD-10-CM | POA: Diagnosis not present

## 2016-12-01 DIAGNOSIS — Z7901 Long term (current) use of anticoagulants: Secondary | ICD-10-CM | POA: Diagnosis not present

## 2016-12-01 DIAGNOSIS — D72829 Elevated white blood cell count, unspecified: Secondary | ICD-10-CM | POA: Diagnosis not present

## 2016-12-01 DIAGNOSIS — K219 Gastro-esophageal reflux disease without esophagitis: Secondary | ICD-10-CM | POA: Diagnosis not present

## 2016-12-01 DIAGNOSIS — Z880 Allergy status to penicillin: Secondary | ICD-10-CM | POA: Diagnosis not present

## 2016-12-01 DIAGNOSIS — E785 Hyperlipidemia, unspecified: Secondary | ICD-10-CM | POA: Diagnosis not present

## 2016-12-01 DIAGNOSIS — Z7984 Long term (current) use of oral hypoglycemic drugs: Secondary | ICD-10-CM | POA: Diagnosis not present

## 2016-12-01 DIAGNOSIS — Z89421 Acquired absence of other right toe(s): Secondary | ICD-10-CM | POA: Diagnosis not present

## 2016-12-01 DIAGNOSIS — I252 Old myocardial infarction: Secondary | ICD-10-CM | POA: Diagnosis not present

## 2016-12-01 DIAGNOSIS — E1151 Type 2 diabetes mellitus with diabetic peripheral angiopathy without gangrene: Secondary | ICD-10-CM | POA: Diagnosis not present

## 2016-12-01 DIAGNOSIS — Z89422 Acquired absence of other left toe(s): Secondary | ICD-10-CM | POA: Diagnosis not present

## 2016-12-01 DIAGNOSIS — Z79899 Other long term (current) drug therapy: Secondary | ICD-10-CM | POA: Diagnosis not present

## 2016-12-01 DIAGNOSIS — M6281 Muscle weakness (generalized): Secondary | ICD-10-CM | POA: Diagnosis not present

## 2016-12-01 DIAGNOSIS — I639 Cerebral infarction, unspecified: Secondary | ICD-10-CM | POA: Diagnosis not present

## 2016-12-01 DIAGNOSIS — G459 Transient cerebral ischemic attack, unspecified: Secondary | ICD-10-CM | POA: Diagnosis not present

## 2016-12-01 DIAGNOSIS — M549 Dorsalgia, unspecified: Secondary | ICD-10-CM | POA: Diagnosis not present

## 2016-12-02 DIAGNOSIS — F17211 Nicotine dependence, cigarettes, in remission: Secondary | ICD-10-CM | POA: Diagnosis not present

## 2016-12-02 DIAGNOSIS — I639 Cerebral infarction, unspecified: Secondary | ICD-10-CM | POA: Diagnosis not present

## 2016-12-02 DIAGNOSIS — E119 Type 2 diabetes mellitus without complications: Secondary | ICD-10-CM | POA: Diagnosis not present

## 2016-12-02 DIAGNOSIS — I358 Other nonrheumatic aortic valve disorders: Secondary | ICD-10-CM | POA: Diagnosis not present

## 2016-12-02 DIAGNOSIS — I119 Hypertensive heart disease without heart failure: Secondary | ICD-10-CM | POA: Diagnosis not present

## 2016-12-02 DIAGNOSIS — Z6827 Body mass index (BMI) 27.0-27.9, adult: Secondary | ICD-10-CM | POA: Diagnosis not present

## 2016-12-02 DIAGNOSIS — R29898 Other symptoms and signs involving the musculoskeletal system: Secondary | ICD-10-CM | POA: Diagnosis not present

## 2016-12-02 DIAGNOSIS — E784 Other hyperlipidemia: Secondary | ICD-10-CM | POA: Diagnosis not present

## 2016-12-02 DIAGNOSIS — I252 Old myocardial infarction: Secondary | ICD-10-CM | POA: Diagnosis not present

## 2016-12-02 DIAGNOSIS — I517 Cardiomegaly: Secondary | ICD-10-CM | POA: Diagnosis not present

## 2016-12-02 DIAGNOSIS — I739 Peripheral vascular disease, unspecified: Secondary | ICD-10-CM | POA: Diagnosis not present

## 2016-12-03 DIAGNOSIS — R29898 Other symptoms and signs involving the musculoskeletal system: Secondary | ICD-10-CM | POA: Diagnosis not present

## 2016-12-03 DIAGNOSIS — I4510 Unspecified right bundle-branch block: Secondary | ICD-10-CM | POA: Diagnosis not present

## 2016-12-03 DIAGNOSIS — I440 Atrioventricular block, first degree: Secondary | ICD-10-CM | POA: Diagnosis not present

## 2016-12-03 DIAGNOSIS — R9431 Abnormal electrocardiogram [ECG] [EKG]: Secondary | ICD-10-CM | POA: Diagnosis not present

## 2016-12-03 DIAGNOSIS — Z6827 Body mass index (BMI) 27.0-27.9, adult: Secondary | ICD-10-CM | POA: Diagnosis not present

## 2016-12-04 DIAGNOSIS — Z89422 Acquired absence of other left toe(s): Secondary | ICD-10-CM | POA: Diagnosis not present

## 2016-12-04 DIAGNOSIS — Z955 Presence of coronary angioplasty implant and graft: Secondary | ICD-10-CM | POA: Diagnosis not present

## 2016-12-04 DIAGNOSIS — E785 Hyperlipidemia, unspecified: Secondary | ICD-10-CM | POA: Diagnosis not present

## 2016-12-04 DIAGNOSIS — I4510 Unspecified right bundle-branch block: Secondary | ICD-10-CM | POA: Diagnosis not present

## 2016-12-04 DIAGNOSIS — I2510 Atherosclerotic heart disease of native coronary artery without angina pectoris: Secondary | ICD-10-CM | POA: Diagnosis not present

## 2016-12-04 DIAGNOSIS — F17211 Nicotine dependence, cigarettes, in remission: Secondary | ICD-10-CM | POA: Diagnosis not present

## 2016-12-04 DIAGNOSIS — Z6827 Body mass index (BMI) 27.0-27.9, adult: Secondary | ICD-10-CM | POA: Diagnosis not present

## 2016-12-04 DIAGNOSIS — F1020 Alcohol dependence, uncomplicated: Secondary | ICD-10-CM | POA: Diagnosis not present

## 2016-12-04 DIAGNOSIS — E119 Type 2 diabetes mellitus without complications: Secondary | ICD-10-CM | POA: Diagnosis not present

## 2016-12-04 DIAGNOSIS — Z87891 Personal history of nicotine dependence: Secondary | ICD-10-CM | POA: Diagnosis not present

## 2016-12-04 DIAGNOSIS — Z7982 Long term (current) use of aspirin: Secondary | ICD-10-CM | POA: Diagnosis not present

## 2016-12-04 DIAGNOSIS — E1151 Type 2 diabetes mellitus with diabetic peripheral angiopathy without gangrene: Secondary | ICD-10-CM | POA: Diagnosis not present

## 2016-12-04 DIAGNOSIS — Z79899 Other long term (current) drug therapy: Secondary | ICD-10-CM | POA: Diagnosis not present

## 2016-12-04 DIAGNOSIS — K219 Gastro-esophageal reflux disease without esophagitis: Secondary | ICD-10-CM | POA: Diagnosis not present

## 2016-12-04 DIAGNOSIS — I10 Essential (primary) hypertension: Secondary | ICD-10-CM | POA: Diagnosis not present

## 2016-12-04 DIAGNOSIS — I214 Non-ST elevation (NSTEMI) myocardial infarction: Secondary | ICD-10-CM | POA: Diagnosis not present

## 2016-12-04 DIAGNOSIS — E784 Other hyperlipidemia: Secondary | ICD-10-CM | POA: Diagnosis not present

## 2016-12-04 DIAGNOSIS — M79602 Pain in left arm: Secondary | ICD-10-CM | POA: Diagnosis not present

## 2016-12-04 DIAGNOSIS — Z7984 Long term (current) use of oral hypoglycemic drugs: Secondary | ICD-10-CM | POA: Diagnosis not present

## 2016-12-04 DIAGNOSIS — I119 Hypertensive heart disease without heart failure: Secondary | ICD-10-CM | POA: Diagnosis not present

## 2016-12-04 DIAGNOSIS — R7989 Other specified abnormal findings of blood chemistry: Secondary | ICD-10-CM | POA: Diagnosis not present

## 2016-12-04 DIAGNOSIS — I25118 Atherosclerotic heart disease of native coronary artery with other forms of angina pectoris: Secondary | ICD-10-CM | POA: Diagnosis not present

## 2016-12-04 DIAGNOSIS — R531 Weakness: Secondary | ICD-10-CM | POA: Diagnosis not present

## 2016-12-04 DIAGNOSIS — I69351 Hemiplegia and hemiparesis following cerebral infarction affecting right dominant side: Secondary | ICD-10-CM | POA: Diagnosis not present

## 2016-12-04 DIAGNOSIS — Z7901 Long term (current) use of anticoagulants: Secondary | ICD-10-CM | POA: Diagnosis not present

## 2016-12-04 DIAGNOSIS — R0789 Other chest pain: Secondary | ICD-10-CM | POA: Diagnosis not present

## 2016-12-04 DIAGNOSIS — Z66 Do not resuscitate: Secondary | ICD-10-CM | POA: Diagnosis not present

## 2016-12-04 DIAGNOSIS — Z89421 Acquired absence of other right toe(s): Secondary | ICD-10-CM | POA: Diagnosis not present

## 2016-12-04 DIAGNOSIS — E118 Type 2 diabetes mellitus with unspecified complications: Secondary | ICD-10-CM | POA: Diagnosis not present

## 2016-12-04 DIAGNOSIS — I739 Peripheral vascular disease, unspecified: Secondary | ICD-10-CM | POA: Diagnosis not present

## 2016-12-04 DIAGNOSIS — R079 Chest pain, unspecified: Secondary | ICD-10-CM | POA: Diagnosis not present

## 2016-12-04 DIAGNOSIS — J849 Interstitial pulmonary disease, unspecified: Secondary | ICD-10-CM | POA: Diagnosis not present

## 2016-12-04 DIAGNOSIS — R0602 Shortness of breath: Secondary | ICD-10-CM | POA: Diagnosis not present

## 2016-12-12 DIAGNOSIS — J22 Unspecified acute lower respiratory infection: Secondary | ICD-10-CM | POA: Diagnosis not present

## 2016-12-12 DIAGNOSIS — R0602 Shortness of breath: Secondary | ICD-10-CM | POA: Diagnosis not present

## 2016-12-13 DIAGNOSIS — Z09 Encounter for follow-up examination after completed treatment for conditions other than malignant neoplasm: Secondary | ICD-10-CM | POA: Diagnosis not present

## 2016-12-13 DIAGNOSIS — K2951 Unspecified chronic gastritis with bleeding: Secondary | ICD-10-CM | POA: Diagnosis not present

## 2016-12-13 DIAGNOSIS — I10 Essential (primary) hypertension: Secondary | ICD-10-CM | POA: Diagnosis not present

## 2016-12-13 DIAGNOSIS — E1140 Type 2 diabetes mellitus with diabetic neuropathy, unspecified: Secondary | ICD-10-CM | POA: Diagnosis not present

## 2016-12-13 DIAGNOSIS — J208 Acute bronchitis due to other specified organisms: Secondary | ICD-10-CM | POA: Diagnosis not present

## 2016-12-14 DIAGNOSIS — Z5181 Encounter for therapeutic drug level monitoring: Secondary | ICD-10-CM | POA: Diagnosis not present

## 2016-12-14 DIAGNOSIS — I70203 Unspecified atherosclerosis of native arteries of extremities, bilateral legs: Secondary | ICD-10-CM | POA: Diagnosis not present

## 2016-12-14 DIAGNOSIS — Z7901 Long term (current) use of anticoagulants: Secondary | ICD-10-CM | POA: Diagnosis not present

## 2016-12-19 ENCOUNTER — Ambulatory Visit (INDEPENDENT_AMBULATORY_CARE_PROVIDER_SITE_OTHER): Payer: PPO | Admitting: Podiatry

## 2016-12-19 ENCOUNTER — Encounter: Payer: Self-pay | Admitting: Podiatry

## 2016-12-19 DIAGNOSIS — I70235 Atherosclerosis of native arteries of right leg with ulceration of other part of foot: Secondary | ICD-10-CM | POA: Diagnosis not present

## 2016-12-19 DIAGNOSIS — L97514 Non-pressure chronic ulcer of other part of right foot with necrosis of bone: Secondary | ICD-10-CM

## 2016-12-19 DIAGNOSIS — M21619 Bunion of unspecified foot: Secondary | ICD-10-CM

## 2016-12-19 NOTE — Progress Notes (Signed)
Subjective: 83year old NIDDM male presents for follow up on right foot ulcer. He missed last 2 week follow up appointment. Stated that he was in hospital with elevated blood sugar and respiratory issue.  Stated that he is doing daily soaking and dressing change.   HPI: On going right foot ulcer over the protruding bunion. The lesion is not improving due to severe bunion deformity. Possible surgical intervention was discussed with his circulation doctor who recommended palliative care as long as possible due to high risk of CVA.  Patient also reluctant to have surgery due to a posibility of loosing the foot if fail to heal.  The wound care, debridement and aperture padding was provided weekly without seeing any improvement on the wound size. He report his blood sugar is under control.   Objective: Dermatologic: No change in size. Open ulcer over the bunion site, 0.8cm opening with yellow necrotic tissue base.  No drainage noted. No edema or erythema noted. Vascular: No proximal or expanded edema or erythema noted. Right foot Dorsalis pedis artery was faintly palpable off and on. Orthopedic: History of multipledigital amputation 1-5 left in 2001. Lost 3rd digit right last year and 2nd digit 2 years ago.  Due to missing 2nd and 3rd digit right, the great toe has severely subluxed to latera direction with protruding bunion and open ulcer.  Severe Hallux valgus with bunion on both feet with open ulcerover right bunion.  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 Failed to improve right foot ulcer at the first metatarsal head with bone exposure. Right first MPJ ulcer, 0.8cm opening covered with devitalized keratotic layers, with possible bone infection.  Plan: Wound debrided and cleansed with H2O2. Aperture pad placed and dressing applied with Amerigel. Continue with daily Antibiotic foot soak as instructed by the supplier. Return in 2 weeks.

## 2016-12-19 NOTE — Patient Instructions (Signed)
Seen for follow up on right foot ulcer. No change noted. Debrided and padded. Continue with daily antibiotic soaking. Return in 2 weeks.

## 2016-12-25 DIAGNOSIS — J449 Chronic obstructive pulmonary disease, unspecified: Secondary | ICD-10-CM | POA: Diagnosis not present

## 2016-12-25 DIAGNOSIS — N200 Calculus of kidney: Secondary | ICD-10-CM | POA: Diagnosis not present

## 2016-12-25 DIAGNOSIS — I10 Essential (primary) hypertension: Secondary | ICD-10-CM | POA: Diagnosis not present

## 2016-12-25 DIAGNOSIS — R109 Unspecified abdominal pain: Secondary | ICD-10-CM | POA: Diagnosis not present

## 2016-12-25 DIAGNOSIS — Z79899 Other long term (current) drug therapy: Secondary | ICD-10-CM | POA: Diagnosis not present

## 2016-12-25 DIAGNOSIS — K219 Gastro-esophageal reflux disease without esophagitis: Secondary | ICD-10-CM | POA: Diagnosis not present

## 2016-12-25 DIAGNOSIS — Z8679 Personal history of other diseases of the circulatory system: Secondary | ICD-10-CM | POA: Diagnosis not present

## 2016-12-25 DIAGNOSIS — R1013 Epigastric pain: Secondary | ICD-10-CM | POA: Diagnosis not present

## 2016-12-25 DIAGNOSIS — R0902 Hypoxemia: Secondary | ICD-10-CM | POA: Diagnosis not present

## 2016-12-25 DIAGNOSIS — R0602 Shortness of breath: Secondary | ICD-10-CM | POA: Diagnosis not present

## 2016-12-25 DIAGNOSIS — Z7982 Long term (current) use of aspirin: Secondary | ICD-10-CM | POA: Diagnosis not present

## 2016-12-25 DIAGNOSIS — R1012 Left upper quadrant pain: Secondary | ICD-10-CM | POA: Diagnosis not present

## 2016-12-25 DIAGNOSIS — Z87891 Personal history of nicotine dependence: Secondary | ICD-10-CM | POA: Diagnosis not present

## 2016-12-25 DIAGNOSIS — E119 Type 2 diabetes mellitus without complications: Secondary | ICD-10-CM | POA: Diagnosis not present

## 2016-12-25 DIAGNOSIS — R918 Other nonspecific abnormal finding of lung field: Secondary | ICD-10-CM | POA: Diagnosis not present

## 2016-12-25 DIAGNOSIS — I252 Old myocardial infarction: Secondary | ICD-10-CM | POA: Diagnosis not present

## 2016-12-25 DIAGNOSIS — K8020 Calculus of gallbladder without cholecystitis without obstruction: Secondary | ICD-10-CM | POA: Diagnosis not present

## 2016-12-25 DIAGNOSIS — R21 Rash and other nonspecific skin eruption: Secondary | ICD-10-CM | POA: Diagnosis not present

## 2016-12-25 DIAGNOSIS — I739 Peripheral vascular disease, unspecified: Secondary | ICD-10-CM | POA: Diagnosis not present

## 2016-12-26 DIAGNOSIS — I739 Peripheral vascular disease, unspecified: Secondary | ICD-10-CM | POA: Diagnosis not present

## 2016-12-26 DIAGNOSIS — I252 Old myocardial infarction: Secondary | ICD-10-CM | POA: Diagnosis not present

## 2016-12-26 DIAGNOSIS — E118 Type 2 diabetes mellitus with unspecified complications: Secondary | ICD-10-CM | POA: Diagnosis not present

## 2016-12-26 DIAGNOSIS — I10 Essential (primary) hypertension: Secondary | ICD-10-CM | POA: Diagnosis not present

## 2016-12-26 DIAGNOSIS — Z6827 Body mass index (BMI) 27.0-27.9, adult: Secondary | ICD-10-CM | POA: Diagnosis not present

## 2016-12-26 DIAGNOSIS — R21 Rash and other nonspecific skin eruption: Secondary | ICD-10-CM | POA: Diagnosis not present

## 2016-12-26 DIAGNOSIS — I440 Atrioventricular block, first degree: Secondary | ICD-10-CM | POA: Diagnosis not present

## 2016-12-26 DIAGNOSIS — R1013 Epigastric pain: Secondary | ICD-10-CM | POA: Diagnosis not present

## 2016-12-26 DIAGNOSIS — I4510 Unspecified right bundle-branch block: Secondary | ICD-10-CM | POA: Diagnosis not present

## 2017-01-02 ENCOUNTER — Ambulatory Visit (INDEPENDENT_AMBULATORY_CARE_PROVIDER_SITE_OTHER): Payer: PPO | Admitting: Podiatry

## 2017-01-02 DIAGNOSIS — L97412 Non-pressure chronic ulcer of right heel and midfoot with fat layer exposed: Secondary | ICD-10-CM

## 2017-01-02 DIAGNOSIS — M21611 Bunion of right foot: Secondary | ICD-10-CM

## 2017-01-02 NOTE — Patient Instructions (Signed)
2 weeks follow up on right foot ulcer. Improvement noted. Will order foot soak to continue till the wound is healed. Return in 2 weeks.

## 2017-01-02 NOTE — Progress Notes (Signed)
Subjective: 83year old NIDDM male presents for scheduled 2 week follow up onright foot ulcer. Stated that he gets out of breath easily and cannot do many things that he used to do. Stated that he is doing daily soaking and dressing change.   HPI: On going right foot ulcer over the protruding bunion. The lesion is not improving due to severe bunion deformity. Possible surgical intervention was discussed with his circulation doctor who recommended palliative care as long as possible due to high risk of CVA.  Patient also reluctant to have surgery due to a posibility of loosing the foot if fail to heal.  The wound care, debridement and aperture padding was provided weekly without seeing any improvement on the wound size. He report his blood sugar is under control.   Objective: Dermatologic: Slight decrease in opening to 0.6 cm with yellow necrotic tissue base.  No drainage noted. No edema or erythema noted. Vascular: No proximal or expanded edema or erythema noted. Right foot Dorsalis pedis artery was faintly palpable off and on. Orthopedic: History of multipledigital amputation 1-5 left in 2001. Lost 3rd digit right last year and 2nd digit 2 years ago.  Due to missing 2nd and 3rd digit right, the great toe has severely subluxed to latera direction with protruding bunion and open ulcer.  Severe Hallux valgus with bunion on both feet with open ulcerover right bunion.  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 Failed to improve right foot ulcer at the first metatarsal head with bone exposure. Right first MPJ ulcer, 0.6cm opening covered with devitalized keratotic layers, with possible bone infection.  Plan: Wound debrided and cleansed with H2O2. Aperture pad placed and dressing applied with Amerigel. Continue with daily Antibiotic foot soak as instructed by the supplier. Return in 2 weeks.

## 2017-01-03 ENCOUNTER — Encounter: Payer: Self-pay | Admitting: Podiatry

## 2017-01-03 DIAGNOSIS — I700 Atherosclerosis of aorta: Secondary | ICD-10-CM | POA: Diagnosis not present

## 2017-01-03 DIAGNOSIS — Z95820 Peripheral vascular angioplasty status with implants and grafts: Secondary | ICD-10-CM | POA: Diagnosis not present

## 2017-01-03 DIAGNOSIS — I70203 Unspecified atherosclerosis of native arteries of extremities, bilateral legs: Secondary | ICD-10-CM | POA: Diagnosis not present

## 2017-01-04 DIAGNOSIS — Z6827 Body mass index (BMI) 27.0-27.9, adult: Secondary | ICD-10-CM | POA: Diagnosis not present

## 2017-01-04 DIAGNOSIS — I2510 Atherosclerotic heart disease of native coronary artery without angina pectoris: Secondary | ICD-10-CM | POA: Diagnosis not present

## 2017-01-10 DIAGNOSIS — H18413 Arcus senilis, bilateral: Secondary | ICD-10-CM | POA: Insufficient documentation

## 2017-01-10 DIAGNOSIS — H11153 Pinguecula, bilateral: Secondary | ICD-10-CM | POA: Diagnosis not present

## 2017-01-10 DIAGNOSIS — H43393 Other vitreous opacities, bilateral: Secondary | ICD-10-CM | POA: Diagnosis not present

## 2017-01-10 DIAGNOSIS — Z961 Presence of intraocular lens: Secondary | ICD-10-CM | POA: Diagnosis not present

## 2017-01-10 DIAGNOSIS — H5713 Ocular pain, bilateral: Secondary | ICD-10-CM | POA: Diagnosis not present

## 2017-01-10 DIAGNOSIS — H35443 Age-related reticular degeneration of retina, bilateral: Secondary | ICD-10-CM | POA: Diagnosis not present

## 2017-01-11 DIAGNOSIS — I70203 Unspecified atherosclerosis of native arteries of extremities, bilateral legs: Secondary | ICD-10-CM | POA: Diagnosis not present

## 2017-01-11 DIAGNOSIS — Z5181 Encounter for therapeutic drug level monitoring: Secondary | ICD-10-CM | POA: Diagnosis not present

## 2017-01-11 DIAGNOSIS — Z7901 Long term (current) use of anticoagulants: Secondary | ICD-10-CM | POA: Diagnosis not present

## 2017-01-16 ENCOUNTER — Ambulatory Visit: Payer: PPO | Admitting: Podiatry

## 2017-01-23 DIAGNOSIS — E1140 Type 2 diabetes mellitus with diabetic neuropathy, unspecified: Secondary | ICD-10-CM | POA: Diagnosis not present

## 2017-01-23 DIAGNOSIS — K2951 Unspecified chronic gastritis with bleeding: Secondary | ICD-10-CM | POA: Diagnosis not present

## 2017-01-31 DIAGNOSIS — E7800 Pure hypercholesterolemia, unspecified: Secondary | ICD-10-CM | POA: Diagnosis not present

## 2017-01-31 DIAGNOSIS — I10 Essential (primary) hypertension: Secondary | ICD-10-CM | POA: Diagnosis not present

## 2017-01-31 DIAGNOSIS — E1140 Type 2 diabetes mellitus with diabetic neuropathy, unspecified: Secondary | ICD-10-CM | POA: Diagnosis not present

## 2017-01-31 DIAGNOSIS — I70203 Unspecified atherosclerosis of native arteries of extremities, bilateral legs: Secondary | ICD-10-CM | POA: Diagnosis not present

## 2017-01-31 DIAGNOSIS — Z7901 Long term (current) use of anticoagulants: Secondary | ICD-10-CM | POA: Diagnosis not present

## 2017-02-06 DIAGNOSIS — M545 Low back pain: Secondary | ICD-10-CM | POA: Diagnosis not present

## 2017-02-06 DIAGNOSIS — M47816 Spondylosis without myelopathy or radiculopathy, lumbar region: Secondary | ICD-10-CM | POA: Diagnosis not present

## 2017-02-06 DIAGNOSIS — N390 Urinary tract infection, site not specified: Secondary | ICD-10-CM | POA: Diagnosis not present

## 2017-02-06 DIAGNOSIS — R319 Hematuria, unspecified: Secondary | ICD-10-CM | POA: Diagnosis not present

## 2017-02-16 DIAGNOSIS — R791 Abnormal coagulation profile: Secondary | ICD-10-CM | POA: Diagnosis not present

## 2017-02-16 DIAGNOSIS — Z7901 Long term (current) use of anticoagulants: Secondary | ICD-10-CM | POA: Diagnosis not present

## 2017-02-16 DIAGNOSIS — I70203 Unspecified atherosclerosis of native arteries of extremities, bilateral legs: Secondary | ICD-10-CM | POA: Diagnosis not present

## 2017-02-16 DIAGNOSIS — Z5181 Encounter for therapeutic drug level monitoring: Secondary | ICD-10-CM | POA: Diagnosis not present

## 2017-02-17 DIAGNOSIS — K8020 Calculus of gallbladder without cholecystitis without obstruction: Secondary | ICD-10-CM | POA: Diagnosis not present

## 2017-02-17 DIAGNOSIS — R1084 Generalized abdominal pain: Secondary | ICD-10-CM | POA: Diagnosis not present

## 2017-02-17 DIAGNOSIS — R109 Unspecified abdominal pain: Secondary | ICD-10-CM | POA: Diagnosis not present

## 2017-02-17 DIAGNOSIS — I499 Cardiac arrhythmia, unspecified: Secondary | ICD-10-CM | POA: Diagnosis not present

## 2017-02-17 DIAGNOSIS — K5900 Constipation, unspecified: Secondary | ICD-10-CM | POA: Diagnosis not present

## 2017-02-18 DIAGNOSIS — K648 Other hemorrhoids: Secondary | ICD-10-CM | POA: Diagnosis not present

## 2017-02-18 DIAGNOSIS — R933 Abnormal findings on diagnostic imaging of other parts of digestive tract: Secondary | ICD-10-CM | POA: Diagnosis not present

## 2017-02-18 DIAGNOSIS — I252 Old myocardial infarction: Secondary | ICD-10-CM | POA: Diagnosis not present

## 2017-02-18 DIAGNOSIS — K5792 Diverticulitis of intestine, part unspecified, without perforation or abscess without bleeding: Secondary | ICD-10-CM | POA: Diagnosis not present

## 2017-02-18 DIAGNOSIS — Z955 Presence of coronary angioplasty implant and graft: Secondary | ICD-10-CM | POA: Diagnosis not present

## 2017-02-18 DIAGNOSIS — D649 Anemia, unspecified: Secondary | ICD-10-CM | POA: Diagnosis not present

## 2017-02-18 DIAGNOSIS — M4317 Spondylolisthesis, lumbosacral region: Secondary | ICD-10-CM | POA: Diagnosis not present

## 2017-02-18 DIAGNOSIS — K8020 Calculus of gallbladder without cholecystitis without obstruction: Secondary | ICD-10-CM | POA: Diagnosis not present

## 2017-02-18 DIAGNOSIS — Z7984 Long term (current) use of oral hypoglycemic drugs: Secondary | ICD-10-CM | POA: Diagnosis not present

## 2017-02-18 DIAGNOSIS — R109 Unspecified abdominal pain: Secondary | ICD-10-CM | POA: Diagnosis not present

## 2017-02-18 DIAGNOSIS — Z7901 Long term (current) use of anticoagulants: Secondary | ICD-10-CM | POA: Diagnosis not present

## 2017-02-18 DIAGNOSIS — Z8719 Personal history of other diseases of the digestive system: Secondary | ICD-10-CM | POA: Diagnosis not present

## 2017-02-18 DIAGNOSIS — K921 Melena: Secondary | ICD-10-CM | POA: Diagnosis not present

## 2017-02-18 DIAGNOSIS — I2510 Atherosclerotic heart disease of native coronary artery without angina pectoris: Secondary | ICD-10-CM | POA: Diagnosis not present

## 2017-02-18 DIAGNOSIS — R195 Other fecal abnormalities: Secondary | ICD-10-CM | POA: Diagnosis not present

## 2017-02-18 DIAGNOSIS — I10 Essential (primary) hypertension: Secondary | ICD-10-CM | POA: Diagnosis not present

## 2017-02-18 DIAGNOSIS — Z89422 Acquired absence of other left toe(s): Secondary | ICD-10-CM | POA: Diagnosis not present

## 2017-02-18 DIAGNOSIS — Z89421 Acquired absence of other right toe(s): Secondary | ICD-10-CM | POA: Diagnosis not present

## 2017-02-18 DIAGNOSIS — E119 Type 2 diabetes mellitus without complications: Secondary | ICD-10-CM | POA: Diagnosis not present

## 2017-02-18 DIAGNOSIS — N179 Acute kidney failure, unspecified: Secondary | ICD-10-CM | POA: Diagnosis not present

## 2017-02-18 DIAGNOSIS — I739 Peripheral vascular disease, unspecified: Secondary | ICD-10-CM | POA: Diagnosis not present

## 2017-02-18 DIAGNOSIS — Z6827 Body mass index (BMI) 27.0-27.9, adult: Secondary | ICD-10-CM | POA: Diagnosis not present

## 2017-02-18 DIAGNOSIS — Z794 Long term (current) use of insulin: Secondary | ICD-10-CM | POA: Diagnosis not present

## 2017-02-18 DIAGNOSIS — I4510 Unspecified right bundle-branch block: Secondary | ICD-10-CM | POA: Diagnosis not present

## 2017-02-18 DIAGNOSIS — I491 Atrial premature depolarization: Secondary | ICD-10-CM | POA: Diagnosis not present

## 2017-02-18 DIAGNOSIS — R1011 Right upper quadrant pain: Secondary | ICD-10-CM | POA: Diagnosis not present

## 2017-02-18 DIAGNOSIS — K219 Gastro-esophageal reflux disease without esophagitis: Secondary | ICD-10-CM | POA: Diagnosis not present

## 2017-02-18 DIAGNOSIS — E1151 Type 2 diabetes mellitus with diabetic peripheral angiopathy without gangrene: Secondary | ICD-10-CM | POA: Diagnosis not present

## 2017-02-18 DIAGNOSIS — Z7982 Long term (current) use of aspirin: Secondary | ICD-10-CM | POA: Diagnosis not present

## 2017-02-18 DIAGNOSIS — I700 Atherosclerosis of aorta: Secondary | ICD-10-CM | POA: Diagnosis not present

## 2017-02-18 DIAGNOSIS — K5712 Diverticulitis of small intestine without perforation or abscess without bleeding: Secondary | ICD-10-CM | POA: Diagnosis not present

## 2017-02-28 DIAGNOSIS — Z5181 Encounter for therapeutic drug level monitoring: Secondary | ICD-10-CM | POA: Diagnosis not present

## 2017-02-28 DIAGNOSIS — I70203 Unspecified atherosclerosis of native arteries of extremities, bilateral legs: Secondary | ICD-10-CM | POA: Diagnosis not present

## 2017-02-28 DIAGNOSIS — Z7901 Long term (current) use of anticoagulants: Secondary | ICD-10-CM | POA: Diagnosis not present

## 2017-02-28 DIAGNOSIS — R791 Abnormal coagulation profile: Secondary | ICD-10-CM | POA: Diagnosis not present

## 2017-03-02 DIAGNOSIS — K5712 Diverticulitis of small intestine without perforation or abscess without bleeding: Secondary | ICD-10-CM | POA: Diagnosis not present

## 2017-03-02 DIAGNOSIS — E782 Mixed hyperlipidemia: Secondary | ICD-10-CM | POA: Diagnosis not present

## 2017-03-02 DIAGNOSIS — E1140 Type 2 diabetes mellitus with diabetic neuropathy, unspecified: Secondary | ICD-10-CM | POA: Diagnosis not present

## 2017-03-02 DIAGNOSIS — Z09 Encounter for follow-up examination after completed treatment for conditions other than malignant neoplasm: Secondary | ICD-10-CM | POA: Diagnosis not present

## 2017-03-02 DIAGNOSIS — R531 Weakness: Secondary | ICD-10-CM | POA: Diagnosis not present

## 2017-03-02 DIAGNOSIS — R1084 Generalized abdominal pain: Secondary | ICD-10-CM | POA: Diagnosis not present

## 2017-03-04 DIAGNOSIS — E1140 Type 2 diabetes mellitus with diabetic neuropathy, unspecified: Secondary | ICD-10-CM | POA: Diagnosis not present

## 2017-03-08 DIAGNOSIS — E1140 Type 2 diabetes mellitus with diabetic neuropathy, unspecified: Secondary | ICD-10-CM | POA: Diagnosis not present

## 2017-03-08 DIAGNOSIS — I10 Essential (primary) hypertension: Secondary | ICD-10-CM | POA: Diagnosis not present

## 2017-03-10 DIAGNOSIS — R195 Other fecal abnormalities: Secondary | ICD-10-CM | POA: Diagnosis not present

## 2017-03-10 DIAGNOSIS — D500 Iron deficiency anemia secondary to blood loss (chronic): Secondary | ICD-10-CM | POA: Diagnosis not present

## 2017-03-10 DIAGNOSIS — K5712 Diverticulitis of small intestine without perforation or abscess without bleeding: Secondary | ICD-10-CM | POA: Diagnosis not present

## 2017-03-10 DIAGNOSIS — I739 Peripheral vascular disease, unspecified: Secondary | ICD-10-CM | POA: Diagnosis not present

## 2017-03-13 DIAGNOSIS — I10 Essential (primary) hypertension: Secondary | ICD-10-CM | POA: Diagnosis not present

## 2017-03-13 DIAGNOSIS — R531 Weakness: Secondary | ICD-10-CM | POA: Diagnosis not present

## 2017-03-13 DIAGNOSIS — E11621 Type 2 diabetes mellitus with foot ulcer: Secondary | ICD-10-CM | POA: Diagnosis not present

## 2017-03-13 DIAGNOSIS — E1142 Type 2 diabetes mellitus with diabetic polyneuropathy: Secondary | ICD-10-CM | POA: Diagnosis not present

## 2017-03-27 DIAGNOSIS — R531 Weakness: Secondary | ICD-10-CM | POA: Diagnosis not present

## 2017-03-27 DIAGNOSIS — E1142 Type 2 diabetes mellitus with diabetic polyneuropathy: Secondary | ICD-10-CM | POA: Diagnosis not present

## 2017-03-27 DIAGNOSIS — I10 Essential (primary) hypertension: Secondary | ICD-10-CM | POA: Diagnosis not present

## 2017-03-27 DIAGNOSIS — I252 Old myocardial infarction: Secondary | ICD-10-CM | POA: Diagnosis not present

## 2017-04-11 DIAGNOSIS — J209 Acute bronchitis, unspecified: Secondary | ICD-10-CM | POA: Diagnosis not present

## 2017-04-30 DIAGNOSIS — R51 Headache: Secondary | ICD-10-CM | POA: Diagnosis not present

## 2017-04-30 DIAGNOSIS — R7989 Other specified abnormal findings of blood chemistry: Secondary | ICD-10-CM | POA: Diagnosis not present

## 2017-04-30 DIAGNOSIS — Z7984 Long term (current) use of oral hypoglycemic drugs: Secondary | ICD-10-CM | POA: Diagnosis not present

## 2017-04-30 DIAGNOSIS — J449 Chronic obstructive pulmonary disease, unspecified: Secondary | ICD-10-CM | POA: Diagnosis not present

## 2017-04-30 DIAGNOSIS — D509 Iron deficiency anemia, unspecified: Secondary | ICD-10-CM | POA: Diagnosis not present

## 2017-04-30 DIAGNOSIS — E1151 Type 2 diabetes mellitus with diabetic peripheral angiopathy without gangrene: Secondary | ICD-10-CM | POA: Diagnosis not present

## 2017-04-30 DIAGNOSIS — M545 Low back pain: Secondary | ICD-10-CM | POA: Diagnosis not present

## 2017-04-30 DIAGNOSIS — R748 Abnormal levels of other serum enzymes: Secondary | ICD-10-CM | POA: Diagnosis not present

## 2017-04-30 DIAGNOSIS — E1142 Type 2 diabetes mellitus with diabetic polyneuropathy: Secondary | ICD-10-CM | POA: Diagnosis not present

## 2017-04-30 DIAGNOSIS — I252 Old myocardial infarction: Secondary | ICD-10-CM | POA: Diagnosis not present

## 2017-04-30 DIAGNOSIS — Z87891 Personal history of nicotine dependence: Secondary | ICD-10-CM | POA: Diagnosis not present

## 2017-04-30 DIAGNOSIS — I10 Essential (primary) hypertension: Secondary | ICD-10-CM | POA: Diagnosis not present

## 2017-04-30 DIAGNOSIS — R0602 Shortness of breath: Secondary | ICD-10-CM | POA: Diagnosis not present

## 2017-04-30 DIAGNOSIS — H547 Unspecified visual loss: Secondary | ICD-10-CM | POA: Diagnosis not present

## 2017-04-30 DIAGNOSIS — I70203 Unspecified atherosclerosis of native arteries of extremities, bilateral legs: Secondary | ICD-10-CM | POA: Diagnosis not present

## 2017-04-30 DIAGNOSIS — H5461 Unqualified visual loss, right eye, normal vision left eye: Secondary | ICD-10-CM | POA: Diagnosis not present

## 2017-04-30 DIAGNOSIS — I6529 Occlusion and stenosis of unspecified carotid artery: Secondary | ICD-10-CM | POA: Diagnosis not present

## 2017-04-30 DIAGNOSIS — R05 Cough: Secondary | ICD-10-CM | POA: Diagnosis not present

## 2017-04-30 DIAGNOSIS — Z955 Presence of coronary angioplasty implant and graft: Secondary | ICD-10-CM | POA: Diagnosis not present

## 2017-04-30 DIAGNOSIS — Z79899 Other long term (current) drug therapy: Secondary | ICD-10-CM | POA: Diagnosis not present

## 2017-04-30 DIAGNOSIS — Z7901 Long term (current) use of anticoagulants: Secondary | ICD-10-CM | POA: Diagnosis not present

## 2017-04-30 DIAGNOSIS — R52 Pain, unspecified: Secondary | ICD-10-CM | POA: Diagnosis not present

## 2017-04-30 DIAGNOSIS — M6281 Muscle weakness (generalized): Secondary | ICD-10-CM | POA: Diagnosis not present

## 2017-04-30 DIAGNOSIS — I6523 Occlusion and stenosis of bilateral carotid arteries: Secondary | ICD-10-CM | POA: Diagnosis not present

## 2017-04-30 DIAGNOSIS — I2510 Atherosclerotic heart disease of native coronary artery without angina pectoris: Secondary | ICD-10-CM | POA: Diagnosis not present

## 2017-05-01 DIAGNOSIS — I2510 Atherosclerotic heart disease of native coronary artery without angina pectoris: Secondary | ICD-10-CM | POA: Diagnosis not present

## 2017-05-01 DIAGNOSIS — R748 Abnormal levels of other serum enzymes: Secondary | ICD-10-CM | POA: Diagnosis not present

## 2017-05-01 DIAGNOSIS — I4510 Unspecified right bundle-branch block: Secondary | ICD-10-CM | POA: Diagnosis not present

## 2017-05-01 DIAGNOSIS — E1142 Type 2 diabetes mellitus with diabetic polyneuropathy: Secondary | ICD-10-CM | POA: Diagnosis not present

## 2017-05-01 DIAGNOSIS — I739 Peripheral vascular disease, unspecified: Secondary | ICD-10-CM | POA: Diagnosis not present

## 2017-05-01 DIAGNOSIS — R079 Chest pain, unspecified: Secondary | ICD-10-CM | POA: Diagnosis not present

## 2017-05-01 DIAGNOSIS — I10 Essential (primary) hypertension: Secondary | ICD-10-CM | POA: Diagnosis not present

## 2017-05-01 DIAGNOSIS — I440 Atrioventricular block, first degree: Secondary | ICD-10-CM | POA: Diagnosis not present

## 2017-05-01 DIAGNOSIS — D509 Iron deficiency anemia, unspecified: Secondary | ICD-10-CM | POA: Diagnosis not present

## 2017-05-01 DIAGNOSIS — I340 Nonrheumatic mitral (valve) insufficiency: Secondary | ICD-10-CM | POA: Diagnosis not present

## 2017-05-01 DIAGNOSIS — I77819 Aortic ectasia, unspecified site: Secondary | ICD-10-CM | POA: Diagnosis not present

## 2017-05-01 DIAGNOSIS — R0789 Other chest pain: Secondary | ICD-10-CM | POA: Diagnosis not present

## 2017-05-02 DIAGNOSIS — H547 Unspecified visual loss: Secondary | ICD-10-CM | POA: Diagnosis not present

## 2017-05-02 DIAGNOSIS — R0602 Shortness of breath: Secondary | ICD-10-CM | POA: Diagnosis not present

## 2017-05-02 DIAGNOSIS — R7989 Other specified abnormal findings of blood chemistry: Secondary | ICD-10-CM | POA: Diagnosis not present

## 2017-05-02 DIAGNOSIS — Z955 Presence of coronary angioplasty implant and graft: Secondary | ICD-10-CM | POA: Diagnosis not present

## 2017-05-02 DIAGNOSIS — I2510 Atherosclerotic heart disease of native coronary artery without angina pectoris: Secondary | ICD-10-CM | POA: Diagnosis not present

## 2017-05-02 DIAGNOSIS — H538 Other visual disturbances: Secondary | ICD-10-CM | POA: Diagnosis not present

## 2017-05-02 DIAGNOSIS — R52 Pain, unspecified: Secondary | ICD-10-CM | POA: Diagnosis not present

## 2017-05-02 DIAGNOSIS — D509 Iron deficiency anemia, unspecified: Secondary | ICD-10-CM | POA: Diagnosis not present

## 2017-05-02 DIAGNOSIS — R748 Abnormal levels of other serum enzymes: Secondary | ICD-10-CM | POA: Diagnosis not present

## 2017-05-02 DIAGNOSIS — I700 Atherosclerosis of aorta: Secondary | ICD-10-CM | POA: Diagnosis not present

## 2017-05-03 DIAGNOSIS — E1142 Type 2 diabetes mellitus with diabetic polyneuropathy: Secondary | ICD-10-CM | POA: Diagnosis not present

## 2017-05-03 DIAGNOSIS — I10 Essential (primary) hypertension: Secondary | ICD-10-CM | POA: Diagnosis not present

## 2017-05-03 DIAGNOSIS — I739 Peripheral vascular disease, unspecified: Secondary | ICD-10-CM | POA: Diagnosis not present

## 2017-05-03 DIAGNOSIS — D509 Iron deficiency anemia, unspecified: Secondary | ICD-10-CM | POA: Diagnosis not present

## 2017-05-03 DIAGNOSIS — I2510 Atherosclerotic heart disease of native coronary artery without angina pectoris: Secondary | ICD-10-CM | POA: Diagnosis not present

## 2017-05-03 DIAGNOSIS — R0789 Other chest pain: Secondary | ICD-10-CM | POA: Diagnosis not present

## 2017-05-03 DIAGNOSIS — J449 Chronic obstructive pulmonary disease, unspecified: Secondary | ICD-10-CM | POA: Diagnosis not present

## 2017-05-03 DIAGNOSIS — H53131 Sudden visual loss, right eye: Secondary | ICD-10-CM | POA: Diagnosis not present

## 2017-05-03 DIAGNOSIS — R748 Abnormal levels of other serum enzymes: Secondary | ICD-10-CM | POA: Diagnosis not present

## 2017-05-04 DIAGNOSIS — H3411 Central retinal artery occlusion, right eye: Secondary | ICD-10-CM | POA: Diagnosis not present

## 2017-05-04 DIAGNOSIS — M316 Other giant cell arteritis: Secondary | ICD-10-CM | POA: Diagnosis not present

## 2017-05-04 DIAGNOSIS — I70203 Unspecified atherosclerosis of native arteries of extremities, bilateral legs: Secondary | ICD-10-CM | POA: Diagnosis not present

## 2017-05-12 DIAGNOSIS — I252 Old myocardial infarction: Secondary | ICD-10-CM | POA: Diagnosis not present

## 2017-05-12 DIAGNOSIS — Z89421 Acquired absence of other right toe(s): Secondary | ICD-10-CM | POA: Diagnosis not present

## 2017-05-12 DIAGNOSIS — Z880 Allergy status to penicillin: Secondary | ICD-10-CM | POA: Diagnosis not present

## 2017-05-12 DIAGNOSIS — E1151 Type 2 diabetes mellitus with diabetic peripheral angiopathy without gangrene: Secondary | ICD-10-CM | POA: Diagnosis not present

## 2017-05-12 DIAGNOSIS — W3400XA Accidental discharge from unspecified firearms or gun, initial encounter: Secondary | ICD-10-CM | POA: Diagnosis not present

## 2017-05-12 DIAGNOSIS — I10 Essential (primary) hypertension: Secondary | ICD-10-CM | POA: Diagnosis not present

## 2017-05-12 DIAGNOSIS — Z87891 Personal history of nicotine dependence: Secondary | ICD-10-CM | POA: Diagnosis not present

## 2017-05-12 DIAGNOSIS — Z955 Presence of coronary angioplasty implant and graft: Secondary | ICD-10-CM | POA: Diagnosis not present

## 2017-05-12 DIAGNOSIS — Z7982 Long term (current) use of aspirin: Secondary | ICD-10-CM | POA: Diagnosis not present

## 2017-05-12 DIAGNOSIS — Z881 Allergy status to other antibiotic agents status: Secondary | ICD-10-CM | POA: Diagnosis not present

## 2017-05-12 DIAGNOSIS — Y998 Other external cause status: Secondary | ICD-10-CM | POA: Diagnosis not present

## 2017-05-12 DIAGNOSIS — J449 Chronic obstructive pulmonary disease, unspecified: Secondary | ICD-10-CM | POA: Diagnosis not present

## 2017-05-12 DIAGNOSIS — Z7984 Long term (current) use of oral hypoglycemic drugs: Secondary | ICD-10-CM | POA: Diagnosis not present

## 2017-05-12 DIAGNOSIS — Z23 Encounter for immunization: Secondary | ICD-10-CM | POA: Diagnosis not present

## 2017-05-12 DIAGNOSIS — Z79899 Other long term (current) drug therapy: Secondary | ICD-10-CM | POA: Diagnosis not present

## 2017-05-12 DIAGNOSIS — S91002A Unspecified open wound, left ankle, initial encounter: Secondary | ICD-10-CM | POA: Diagnosis not present

## 2017-05-17 DIAGNOSIS — L03116 Cellulitis of left lower limb: Secondary | ICD-10-CM | POA: Diagnosis not present

## 2017-05-23 DIAGNOSIS — H349 Unspecified retinal vascular occlusion: Secondary | ICD-10-CM | POA: Diagnosis not present

## 2017-05-23 DIAGNOSIS — S91002A Unspecified open wound, left ankle, initial encounter: Secondary | ICD-10-CM | POA: Diagnosis not present

## 2017-05-23 DIAGNOSIS — W3400XA Accidental discharge from unspecified firearms or gun, initial encounter: Secondary | ICD-10-CM | POA: Diagnosis not present

## 2017-05-23 DIAGNOSIS — L97329 Non-pressure chronic ulcer of left ankle with unspecified severity: Secondary | ICD-10-CM | POA: Diagnosis not present

## 2017-05-25 DIAGNOSIS — Z87891 Personal history of nicotine dependence: Secondary | ICD-10-CM | POA: Diagnosis not present

## 2017-05-25 DIAGNOSIS — Z7901 Long term (current) use of anticoagulants: Secondary | ICD-10-CM | POA: Diagnosis not present

## 2017-05-25 DIAGNOSIS — E1151 Type 2 diabetes mellitus with diabetic peripheral angiopathy without gangrene: Secondary | ICD-10-CM | POA: Diagnosis not present

## 2017-05-25 DIAGNOSIS — L97822 Non-pressure chronic ulcer of other part of left lower leg with fat layer exposed: Secondary | ICD-10-CM | POA: Diagnosis not present

## 2017-05-25 DIAGNOSIS — Z7984 Long term (current) use of oral hypoglycemic drugs: Secondary | ICD-10-CM | POA: Diagnosis not present

## 2017-05-25 DIAGNOSIS — E785 Hyperlipidemia, unspecified: Secondary | ICD-10-CM | POA: Diagnosis not present

## 2017-05-25 DIAGNOSIS — E11622 Type 2 diabetes mellitus with other skin ulcer: Secondary | ICD-10-CM | POA: Diagnosis not present

## 2017-05-25 DIAGNOSIS — Z881 Allergy status to other antibiotic agents status: Secondary | ICD-10-CM | POA: Diagnosis not present

## 2017-05-25 DIAGNOSIS — Z79899 Other long term (current) drug therapy: Secondary | ICD-10-CM | POA: Diagnosis not present

## 2017-05-25 DIAGNOSIS — R5381 Other malaise: Secondary | ICD-10-CM | POA: Diagnosis not present

## 2017-05-25 DIAGNOSIS — Z7902 Long term (current) use of antithrombotics/antiplatelets: Secondary | ICD-10-CM | POA: Diagnosis not present

## 2017-05-25 DIAGNOSIS — I739 Peripheral vascular disease, unspecified: Secondary | ICD-10-CM | POA: Diagnosis not present

## 2017-05-25 DIAGNOSIS — L97922 Non-pressure chronic ulcer of unspecified part of left lower leg with fat layer exposed: Secondary | ICD-10-CM | POA: Diagnosis not present

## 2017-05-25 DIAGNOSIS — I10 Essential (primary) hypertension: Secondary | ICD-10-CM | POA: Diagnosis not present

## 2017-05-25 DIAGNOSIS — I252 Old myocardial infarction: Secondary | ICD-10-CM | POA: Diagnosis not present

## 2017-05-25 DIAGNOSIS — I2510 Atherosclerotic heart disease of native coronary artery without angina pectoris: Secondary | ICD-10-CM | POA: Diagnosis not present

## 2017-05-25 DIAGNOSIS — J449 Chronic obstructive pulmonary disease, unspecified: Secondary | ICD-10-CM | POA: Diagnosis not present

## 2017-05-25 DIAGNOSIS — R634 Abnormal weight loss: Secondary | ICD-10-CM | POA: Diagnosis not present

## 2017-05-29 DIAGNOSIS — I252 Old myocardial infarction: Secondary | ICD-10-CM | POA: Diagnosis not present

## 2017-05-29 DIAGNOSIS — L97922 Non-pressure chronic ulcer of unspecified part of left lower leg with fat layer exposed: Secondary | ICD-10-CM | POA: Diagnosis not present

## 2017-05-29 DIAGNOSIS — I739 Peripheral vascular disease, unspecified: Secondary | ICD-10-CM | POA: Diagnosis not present

## 2017-05-29 DIAGNOSIS — Z7901 Long term (current) use of anticoagulants: Secondary | ICD-10-CM | POA: Diagnosis not present

## 2017-05-29 DIAGNOSIS — I2510 Atherosclerotic heart disease of native coronary artery without angina pectoris: Secondary | ICD-10-CM | POA: Diagnosis not present

## 2017-05-29 DIAGNOSIS — I10 Essential (primary) hypertension: Secondary | ICD-10-CM | POA: Diagnosis not present

## 2017-05-29 DIAGNOSIS — W3400XA Accidental discharge from unspecified firearms or gun, initial encounter: Secondary | ICD-10-CM | POA: Diagnosis not present

## 2017-06-01 DIAGNOSIS — L97929 Non-pressure chronic ulcer of unspecified part of left lower leg with unspecified severity: Secondary | ICD-10-CM | POA: Diagnosis not present

## 2017-06-01 DIAGNOSIS — L97922 Non-pressure chronic ulcer of unspecified part of left lower leg with fat layer exposed: Secondary | ICD-10-CM | POA: Diagnosis not present

## 2017-06-01 DIAGNOSIS — Z95828 Presence of other vascular implants and grafts: Secondary | ICD-10-CM | POA: Diagnosis not present

## 2017-06-01 DIAGNOSIS — R5381 Other malaise: Secondary | ICD-10-CM | POA: Diagnosis not present

## 2017-06-01 DIAGNOSIS — Z7901 Long term (current) use of anticoagulants: Secondary | ICD-10-CM | POA: Diagnosis not present

## 2017-06-01 DIAGNOSIS — I70203 Unspecified atherosclerosis of native arteries of extremities, bilateral legs: Secondary | ICD-10-CM | POA: Diagnosis not present

## 2017-06-01 DIAGNOSIS — I739 Peripheral vascular disease, unspecified: Secondary | ICD-10-CM | POA: Diagnosis not present

## 2017-06-06 DIAGNOSIS — W3400XA Accidental discharge from unspecified firearms or gun, initial encounter: Secondary | ICD-10-CM | POA: Diagnosis not present

## 2017-06-06 DIAGNOSIS — I70213 Atherosclerosis of native arteries of extremities with intermittent claudication, bilateral legs: Secondary | ICD-10-CM | POA: Diagnosis not present

## 2017-06-06 DIAGNOSIS — L97922 Non-pressure chronic ulcer of unspecified part of left lower leg with fat layer exposed: Secondary | ICD-10-CM | POA: Diagnosis not present

## 2017-06-07 DIAGNOSIS — Z01818 Encounter for other preprocedural examination: Secondary | ICD-10-CM | POA: Diagnosis not present

## 2017-06-07 DIAGNOSIS — I70213 Atherosclerosis of native arteries of extremities with intermittent claudication, bilateral legs: Secondary | ICD-10-CM | POA: Diagnosis not present

## 2017-06-07 DIAGNOSIS — R451 Restlessness and agitation: Secondary | ICD-10-CM | POA: Diagnosis not present

## 2017-06-07 DIAGNOSIS — E43 Unspecified severe protein-calorie malnutrition: Secondary | ICD-10-CM | POA: Diagnosis not present

## 2017-06-07 DIAGNOSIS — I998 Other disorder of circulatory system: Secondary | ICD-10-CM | POA: Diagnosis not present

## 2017-06-07 DIAGNOSIS — I10 Essential (primary) hypertension: Secondary | ICD-10-CM | POA: Diagnosis not present

## 2017-06-07 DIAGNOSIS — R000 Tachycardia, unspecified: Secondary | ICD-10-CM | POA: Diagnosis not present

## 2017-06-07 DIAGNOSIS — I70235 Atherosclerosis of native arteries of right leg with ulceration of other part of foot: Secondary | ICD-10-CM | POA: Diagnosis not present

## 2017-06-07 DIAGNOSIS — I779 Disorder of arteries and arterioles, unspecified: Secondary | ICD-10-CM | POA: Diagnosis not present

## 2017-06-07 DIAGNOSIS — J449 Chronic obstructive pulmonary disease, unspecified: Secondary | ICD-10-CM | POA: Diagnosis not present

## 2017-06-07 DIAGNOSIS — I2510 Atherosclerotic heart disease of native coronary artery without angina pectoris: Secondary | ICD-10-CM | POA: Diagnosis not present

## 2017-06-07 DIAGNOSIS — E7800 Pure hypercholesterolemia, unspecified: Secondary | ICD-10-CM | POA: Diagnosis not present

## 2017-06-07 DIAGNOSIS — Z96659 Presence of unspecified artificial knee joint: Secondary | ICD-10-CM | POA: Diagnosis not present

## 2017-06-07 DIAGNOSIS — I70244 Atherosclerosis of native arteries of left leg with ulceration of heel and midfoot: Secondary | ICD-10-CM | POA: Diagnosis not present

## 2017-06-07 DIAGNOSIS — S91032A Puncture wound without foreign body, left ankle, initial encounter: Secondary | ICD-10-CM | POA: Diagnosis not present

## 2017-06-07 DIAGNOSIS — D649 Anemia, unspecified: Secondary | ICD-10-CM | POA: Diagnosis not present

## 2017-06-07 DIAGNOSIS — I70229 Atherosclerosis of native arteries of extremities with rest pain, unspecified extremity: Secondary | ICD-10-CM | POA: Insufficient documentation

## 2017-06-07 DIAGNOSIS — I743 Embolism and thrombosis of arteries of the lower extremities: Secondary | ICD-10-CM | POA: Diagnosis not present

## 2017-06-07 DIAGNOSIS — E1142 Type 2 diabetes mellitus with diabetic polyneuropathy: Secondary | ICD-10-CM | POA: Diagnosis not present

## 2017-06-07 DIAGNOSIS — I4510 Unspecified right bundle-branch block: Secondary | ICD-10-CM | POA: Diagnosis not present

## 2017-06-07 DIAGNOSIS — R634 Abnormal weight loss: Secondary | ICD-10-CM | POA: Diagnosis not present

## 2017-06-07 DIAGNOSIS — L97429 Non-pressure chronic ulcer of left heel and midfoot with unspecified severity: Secondary | ICD-10-CM | POA: Diagnosis not present

## 2017-06-07 DIAGNOSIS — R410 Disorientation, unspecified: Secondary | ICD-10-CM | POA: Diagnosis not present

## 2017-06-07 DIAGNOSIS — T82856A Stenosis of peripheral vascular stent, initial encounter: Secondary | ICD-10-CM | POA: Diagnosis not present

## 2017-06-07 DIAGNOSIS — M064 Inflammatory polyarthropathy: Secondary | ICD-10-CM | POA: Diagnosis not present

## 2017-06-07 DIAGNOSIS — E11621 Type 2 diabetes mellitus with foot ulcer: Secondary | ICD-10-CM | POA: Diagnosis not present

## 2017-06-07 DIAGNOSIS — L97922 Non-pressure chronic ulcer of unspecified part of left lower leg with fat layer exposed: Secondary | ICD-10-CM | POA: Diagnosis not present

## 2017-06-07 DIAGNOSIS — L97519 Non-pressure chronic ulcer of other part of right foot with unspecified severity: Secondary | ICD-10-CM | POA: Diagnosis not present

## 2017-06-07 DIAGNOSIS — H547 Unspecified visual loss: Secondary | ICD-10-CM | POA: Diagnosis not present

## 2017-06-07 DIAGNOSIS — T82868A Thrombosis of vascular prosthetic devices, implants and grafts, initial encounter: Secondary | ICD-10-CM | POA: Diagnosis not present

## 2017-06-22 DIAGNOSIS — M79605 Pain in left leg: Secondary | ICD-10-CM | POA: Diagnosis not present

## 2017-06-22 DIAGNOSIS — M47816 Spondylosis without myelopathy or radiculopathy, lumbar region: Secondary | ICD-10-CM | POA: Diagnosis not present

## 2017-06-22 DIAGNOSIS — M6281 Muscle weakness (generalized): Secondary | ICD-10-CM | POA: Diagnosis not present

## 2017-06-22 DIAGNOSIS — Z89612 Acquired absence of left leg above knee: Secondary | ICD-10-CM | POA: Diagnosis not present

## 2017-06-22 DIAGNOSIS — Z5189 Encounter for other specified aftercare: Secondary | ICD-10-CM | POA: Diagnosis not present

## 2017-06-22 DIAGNOSIS — L03119 Cellulitis of unspecified part of limb: Secondary | ICD-10-CM | POA: Diagnosis not present

## 2017-06-22 DIAGNOSIS — R5381 Other malaise: Secondary | ICD-10-CM | POA: Diagnosis not present

## 2017-06-22 DIAGNOSIS — R2681 Unsteadiness on feet: Secondary | ICD-10-CM | POA: Diagnosis not present

## 2017-06-22 DIAGNOSIS — L97922 Non-pressure chronic ulcer of unspecified part of left lower leg with fat layer exposed: Secondary | ICD-10-CM | POA: Diagnosis not present

## 2017-06-22 DIAGNOSIS — R488 Other symbolic dysfunctions: Secondary | ICD-10-CM | POA: Diagnosis not present

## 2017-06-22 DIAGNOSIS — R41841 Cognitive communication deficit: Secondary | ICD-10-CM | POA: Diagnosis not present

## 2017-06-22 DIAGNOSIS — I739 Peripheral vascular disease, unspecified: Secondary | ICD-10-CM | POA: Diagnosis not present

## 2017-06-27 DIAGNOSIS — R2681 Unsteadiness on feet: Secondary | ICD-10-CM | POA: Diagnosis not present

## 2017-06-27 DIAGNOSIS — M6281 Muscle weakness (generalized): Secondary | ICD-10-CM | POA: Diagnosis not present

## 2017-06-27 DIAGNOSIS — Z5189 Encounter for other specified aftercare: Secondary | ICD-10-CM | POA: Diagnosis not present

## 2017-06-27 DIAGNOSIS — Z89612 Acquired absence of left leg above knee: Secondary | ICD-10-CM | POA: Diagnosis not present

## 2017-06-27 DIAGNOSIS — M79605 Pain in left leg: Secondary | ICD-10-CM | POA: Diagnosis not present

## 2017-06-28 DIAGNOSIS — R2681 Unsteadiness on feet: Secondary | ICD-10-CM | POA: Diagnosis not present

## 2017-06-28 DIAGNOSIS — M79605 Pain in left leg: Secondary | ICD-10-CM | POA: Diagnosis not present

## 2017-06-28 DIAGNOSIS — M6281 Muscle weakness (generalized): Secondary | ICD-10-CM | POA: Diagnosis not present

## 2017-06-28 DIAGNOSIS — Z5189 Encounter for other specified aftercare: Secondary | ICD-10-CM | POA: Diagnosis not present

## 2017-06-28 DIAGNOSIS — Z89612 Acquired absence of left leg above knee: Secondary | ICD-10-CM | POA: Diagnosis not present

## 2017-06-30 DIAGNOSIS — Z5189 Encounter for other specified aftercare: Secondary | ICD-10-CM | POA: Diagnosis not present

## 2017-06-30 DIAGNOSIS — M79605 Pain in left leg: Secondary | ICD-10-CM | POA: Diagnosis not present

## 2017-06-30 DIAGNOSIS — Z89612 Acquired absence of left leg above knee: Secondary | ICD-10-CM | POA: Diagnosis not present

## 2017-06-30 DIAGNOSIS — M6281 Muscle weakness (generalized): Secondary | ICD-10-CM | POA: Diagnosis not present

## 2017-06-30 DIAGNOSIS — R2681 Unsteadiness on feet: Secondary | ICD-10-CM | POA: Diagnosis not present

## 2017-07-03 DIAGNOSIS — M79605 Pain in left leg: Secondary | ICD-10-CM | POA: Diagnosis not present

## 2017-07-03 DIAGNOSIS — Z89612 Acquired absence of left leg above knee: Secondary | ICD-10-CM | POA: Diagnosis not present

## 2017-07-03 DIAGNOSIS — Z5189 Encounter for other specified aftercare: Secondary | ICD-10-CM | POA: Diagnosis not present

## 2017-07-03 DIAGNOSIS — M6281 Muscle weakness (generalized): Secondary | ICD-10-CM | POA: Diagnosis not present

## 2017-07-03 DIAGNOSIS — R2681 Unsteadiness on feet: Secondary | ICD-10-CM | POA: Diagnosis not present

## 2017-07-04 DIAGNOSIS — M79605 Pain in left leg: Secondary | ICD-10-CM | POA: Diagnosis not present

## 2017-07-04 DIAGNOSIS — Z89612 Acquired absence of left leg above knee: Secondary | ICD-10-CM | POA: Diagnosis not present

## 2017-07-04 DIAGNOSIS — M6281 Muscle weakness (generalized): Secondary | ICD-10-CM | POA: Diagnosis not present

## 2017-07-04 DIAGNOSIS — Z5189 Encounter for other specified aftercare: Secondary | ICD-10-CM | POA: Diagnosis not present

## 2017-07-04 DIAGNOSIS — R2681 Unsteadiness on feet: Secondary | ICD-10-CM | POA: Diagnosis not present

## 2017-07-05 DIAGNOSIS — M79605 Pain in left leg: Secondary | ICD-10-CM | POA: Diagnosis not present

## 2017-07-05 DIAGNOSIS — M6281 Muscle weakness (generalized): Secondary | ICD-10-CM | POA: Diagnosis not present

## 2017-07-05 DIAGNOSIS — Z5189 Encounter for other specified aftercare: Secondary | ICD-10-CM | POA: Diagnosis not present

## 2017-07-05 DIAGNOSIS — R2681 Unsteadiness on feet: Secondary | ICD-10-CM | POA: Diagnosis not present

## 2017-07-05 DIAGNOSIS — Z89612 Acquired absence of left leg above knee: Secondary | ICD-10-CM | POA: Diagnosis not present

## 2017-07-07 DIAGNOSIS — Z5189 Encounter for other specified aftercare: Secondary | ICD-10-CM | POA: Diagnosis not present

## 2017-07-07 DIAGNOSIS — M6281 Muscle weakness (generalized): Secondary | ICD-10-CM | POA: Diagnosis not present

## 2017-07-07 DIAGNOSIS — Z89612 Acquired absence of left leg above knee: Secondary | ICD-10-CM | POA: Diagnosis not present

## 2017-07-07 DIAGNOSIS — R2681 Unsteadiness on feet: Secondary | ICD-10-CM | POA: Diagnosis not present

## 2017-07-07 DIAGNOSIS — M79605 Pain in left leg: Secondary | ICD-10-CM | POA: Diagnosis not present

## 2017-07-11 DIAGNOSIS — R2681 Unsteadiness on feet: Secondary | ICD-10-CM | POA: Diagnosis not present

## 2017-07-11 DIAGNOSIS — Z5189 Encounter for other specified aftercare: Secondary | ICD-10-CM | POA: Diagnosis not present

## 2017-07-11 DIAGNOSIS — M79605 Pain in left leg: Secondary | ICD-10-CM | POA: Diagnosis not present

## 2017-07-11 DIAGNOSIS — M6281 Muscle weakness (generalized): Secondary | ICD-10-CM | POA: Diagnosis not present

## 2017-07-11 DIAGNOSIS — Z89612 Acquired absence of left leg above knee: Secondary | ICD-10-CM | POA: Diagnosis not present

## 2017-07-12 DIAGNOSIS — I70211 Atherosclerosis of native arteries of extremities with intermittent claudication, right leg: Secondary | ICD-10-CM | POA: Diagnosis not present

## 2017-07-12 DIAGNOSIS — I10 Essential (primary) hypertension: Secondary | ICD-10-CM | POA: Diagnosis not present

## 2017-07-12 DIAGNOSIS — Z4781 Encounter for orthopedic aftercare following surgical amputation: Secondary | ICD-10-CM | POA: Diagnosis not present

## 2017-07-12 DIAGNOSIS — E119 Type 2 diabetes mellitus without complications: Secondary | ICD-10-CM | POA: Diagnosis not present

## 2017-07-12 DIAGNOSIS — Z89612 Acquired absence of left leg above knee: Secondary | ICD-10-CM | POA: Diagnosis not present

## 2017-07-12 DIAGNOSIS — Z7902 Long term (current) use of antithrombotics/antiplatelets: Secondary | ICD-10-CM | POA: Diagnosis not present

## 2017-07-12 DIAGNOSIS — I252 Old myocardial infarction: Secondary | ICD-10-CM | POA: Diagnosis not present

## 2017-07-12 DIAGNOSIS — Z7984 Long term (current) use of oral hypoglycemic drugs: Secondary | ICD-10-CM | POA: Diagnosis not present

## 2017-07-12 DIAGNOSIS — Z79891 Long term (current) use of opiate analgesic: Secondary | ICD-10-CM | POA: Diagnosis not present

## 2017-07-14 ENCOUNTER — Other Ambulatory Visit: Payer: Self-pay

## 2017-07-14 DIAGNOSIS — E1142 Type 2 diabetes mellitus with diabetic polyneuropathy: Secondary | ICD-10-CM | POA: Diagnosis not present

## 2017-07-14 DIAGNOSIS — I10 Essential (primary) hypertension: Secondary | ICD-10-CM | POA: Diagnosis not present

## 2017-07-14 DIAGNOSIS — E7800 Pure hypercholesterolemia, unspecified: Secondary | ICD-10-CM | POA: Diagnosis not present

## 2017-07-14 DIAGNOSIS — K5909 Other constipation: Secondary | ICD-10-CM | POA: Diagnosis not present

## 2017-07-14 NOTE — Patient Outreach (Signed)
[facility]) Care Management  07/14/2017  William Buchanan [birth year] [id number]  Transition of care  Referral date: 07/14/17 Referral source: discharged from [facility] on 07/11/17 Insurance: Health team advantage Attempt #1  Telephone call to patient regarding transition of care referral. HIPAA compliant voice message left with call back phone number.   PLAN: RNCM will attempt 2nd telephone outreach to patient within 3 business days.   Quinn Plowman RN,BSN,CCM [facility] Telephonic  [phone]

## 2017-07-18 ENCOUNTER — Other Ambulatory Visit: Payer: Self-pay

## 2017-07-18 DIAGNOSIS — Z95820 Peripheral vascular angioplasty status with implants and grafts: Secondary | ICD-10-CM | POA: Diagnosis not present

## 2017-07-18 DIAGNOSIS — I739 Peripheral vascular disease, unspecified: Secondary | ICD-10-CM | POA: Diagnosis not present

## 2017-07-18 DIAGNOSIS — Z95828 Presence of other vascular implants and grafts: Secondary | ICD-10-CM | POA: Diagnosis not present

## 2017-07-18 DIAGNOSIS — Z89612 Acquired absence of left leg above knee: Secondary | ICD-10-CM | POA: Diagnosis not present

## 2017-07-18 NOTE — Patient Outreach (Signed)
[facility]) Care Management  07/18/2017  William Buchanan 1934-06-02 [id number]    Transition of care  Referral date: 07/14/17 Referral source: discharged from [facility] on 07/11/17 Insurance: Health team advantage Attempt #2  Telephone call to patient regarding transition of care follow up.  Unable to reach patient. Message states, " this person is not accepting calls at this time.  Please try your call later."  PLAN; RNCM will send patient outreach letter to attempt contact with patient. If not response from patient RNCM will attempt 3rd telephone call within 10 business days.   William Plowman RN,BSN,CCM [facility] Telephonic  [phone]

## 2017-07-19 ENCOUNTER — Other Ambulatory Visit: Payer: Self-pay

## 2017-07-19 NOTE — Patient Outreach (Signed)
[facility] [facility]) Care Management  07/19/2017  Gil Ingwersen 06/24/1934 [id number]  Transition of care  Referral date:07/14/17 Referral source:discharged from [facility] on 07/11/17 Insurance:Health team advantage Attempt #3  Third telephone call to patient regarding transition of care follow up.  Unable to reach patient. Message states, " this person is not accepting calls at this time.  Please try your call later."  PLAN; Outreach letter sent to patient on 07/17/17.     Quinn Plowman RN,BSN,CCM [facility] Telephonic  [phone]

## 2017-08-02 ENCOUNTER — Other Ambulatory Visit: Payer: Self-pay

## 2017-08-02 ENCOUNTER — Ambulatory Visit: Payer: Self-pay

## 2017-08-02 NOTE — Patient Outreach (Signed)
[facility]) Care Management  08/02/2017  William [facility] 1934/02/24 [id number]  No response from patient after 3 telephone calls and outreach letter attempt.  PLAN; RNCM will refer patient to care management assistant to close due to not being able to reach. RNCM will send notification to patients primary MD of closure.   Quinn Plowman RN,BSN,CCM [facility] Telephonic  [phone]

## 2017-08-11 DIAGNOSIS — R5381 Other malaise: Secondary | ICD-10-CM | POA: Diagnosis not present

## 2017-08-11 DIAGNOSIS — L03119 Cellulitis of unspecified part of limb: Secondary | ICD-10-CM | POA: Diagnosis not present

## 2017-08-11 DIAGNOSIS — L97922 Non-pressure chronic ulcer of unspecified part of left lower leg with fat layer exposed: Secondary | ICD-10-CM | POA: Diagnosis not present

## 2017-08-11 DIAGNOSIS — M47816 Spondylosis without myelopathy or radiculopathy, lumbar region: Secondary | ICD-10-CM | POA: Diagnosis not present

## 2017-08-11 NOTE — Telephone Encounter (Signed)
This encounter was created in error - please disregard.

## 2017-08-15 DIAGNOSIS — E11621 Type 2 diabetes mellitus with foot ulcer: Secondary | ICD-10-CM | POA: Diagnosis not present

## 2017-08-15 DIAGNOSIS — Z89512 Acquired absence of left leg below knee: Secondary | ICD-10-CM | POA: Diagnosis not present

## 2017-08-15 DIAGNOSIS — B356 Tinea cruris: Secondary | ICD-10-CM | POA: Diagnosis not present

## 2017-08-15 DIAGNOSIS — L89151 Pressure ulcer of sacral region, stage 1: Secondary | ICD-10-CM | POA: Diagnosis not present

## 2017-08-30 ENCOUNTER — Other Ambulatory Visit: Payer: Self-pay | Admitting: *Deleted

## 2017-08-30 DIAGNOSIS — M159 Polyosteoarthritis, unspecified: Secondary | ICD-10-CM | POA: Diagnosis not present

## 2017-08-30 DIAGNOSIS — E11621 Type 2 diabetes mellitus with foot ulcer: Secondary | ICD-10-CM | POA: Diagnosis not present

## 2017-08-30 DIAGNOSIS — L97511 Non-pressure chronic ulcer of other part of right foot limited to breakdown of skin: Secondary | ICD-10-CM | POA: Diagnosis not present

## 2017-08-30 DIAGNOSIS — Z89512 Acquired absence of left leg below knee: Secondary | ICD-10-CM | POA: Diagnosis not present

## 2017-08-31 ENCOUNTER — Other Ambulatory Visit: Payer: Self-pay | Admitting: *Deleted

## 2017-08-31 NOTE — Patient Outreach (Addendum)
Called pt's son to request that he ask his father to call me to discuss [place] Management services as we have been referred by HTA. Left message and requested a return call.  [name]. [name], MSN, GNP-BC Gerontological Nurse Practitioner [facility] Care Management [phone]  Pt returned my call. He reports he is getting PT, Nursing and a bath aide. He is to go to [facility] next week for a foot evaluation. Pt agreed to a home visit tomorrow am.  Kayleen Memos C. [name], MSN, [facility] Gerontological Nurse Practitioner [facility] Care Management [phone]

## 2017-08-31 NOTE — Patient Outreach (Signed)
THN has previously been unable to contact Mr. Borg by phone. HTA has referred him to [place] again for community care management. I called pt's primary care, Dr. Andria Rhein, and pt was actually in the office at that time. I asked the receptionist to take down a message and give it to Mr. Prather. My name, number, [facility] Care management information given and requested a return call.  [name]. [name], MSN, [facility] Gerontological Nurse Practitioner [facility] Care Management [phone]

## 2017-09-01 ENCOUNTER — Other Ambulatory Visit: Payer: Self-pay | Admitting: *Deleted

## 2017-09-01 ENCOUNTER — Encounter: Payer: Self-pay | Admitting: *Deleted

## 2017-09-01 NOTE — Patient Outreach (Addendum)
[facility]) Care Management   09/01/2017  DERRAN SEAR [birth year] [id number]  [facility] is an 84 y.o. male  Subjective: HTA High Risk Patient in home evaluation. Pt has had L AKA in November. He still does not have his prosthetic. He has also requested an electric wheel chair. Pt also has a diabetic foot wound on R foot. He is getting home health wound care, a bath aid and PT.  Objective: BP (!) 100/50 (BP Location: Left Arm, Patient Position: Sitting, Cuff Size: Normal)   Pulse 84   Resp 18   Ht 1.753 m (5\' 9" )   Wt 140 lb (63.5 kg)   SpO2 97%   BMI 20.67 kg/m     Review of Systems  Constitutional: Negative.   HENT: Negative.   Eyes:       Lost vision R eye.  Respiratory: Negative.   Skin:       Wound on R lateral metatarsal head.   Physical exam: BP (!) 100/50 (BP Location: Left Arm, Patient Position: Sitting, Cuff Size: Normal)   Pulse 84   Resp 18   Ht 1.753 m (5\' 9" )   Wt 140 lb (63.5 kg)   SpO2 97%   BMI 20.67 kg/m  Heart: RRR Lungs: clear Extremities: LAKA                      R foot with severe hallux valgas. Has had amputations of 2nd and 3rd toes. Wound on lateral metatarsal is small approximately 1 cm X 1 cm X 2 mm. The great toe is cyanotic. Pedal and posterior tibial pulse are barely palpable and obliterated with minimal pressure.   Encounter Medications:   Outpatient Encounter Medications as of 09/01/2017  Medication Sig Note  . acetaminophen (TYLENOL) 500 MG tablet Take 500 mg by mouth every 4 (four) hours as needed for moderate pain.   Marland Kitchen albuterol (PROVENTIL HFA;VENTOLIN HFA) 108 (90 Base) MCG/ACT inhaler Inhale into the lungs every 6 (six) hours as needed for wheezing or shortness of breath.   Marland Kitchen aspirin EC 81 MG tablet Take 81 mg by mouth daily.   . clopidogrel (PLAVIX) 75 MG tablet Take 75 mg by mouth daily.   Marland Kitchen glipiZIDE (GLUCOTROL) 10 MG tablet Take 5 mg by mouth.  09/05/2017: Taking 5 mg bid per grand daughter.  Marland Kitchen  lisinopril-hydrochlorothiazide (PRINZIDE,ZESTORETIC) 20-25 MG per tablet  09/22/2014: Received from: External Pharmacy  . pantoprazole (PROTONIX) 40 MG tablet Take 40 mg by mouth daily.   . pioglitazone (ACTOS) 30 MG tablet  09/22/2014: Received from: External Pharmacy  . polyethylene glycol (MIRALAX / GLYCOLAX) packet Take 17 g by mouth daily.   . simvastatin (ZOCOR) 20 MG tablet  09/22/2014: Received from: External Pharmacy  . vitamin B-12 (CYANOCOBALAMIN) 250 MCG tablet Take 250 mcg by mouth daily.   . vitamin C (ASCORBIC ACID) 500 MG tablet Take 500 mg by mouth daily.   . [DISCONTINUED] amoxicillin-clavulanate (AUGMENTIN) 875-125 MG tablet Take 1 tablet by mouth 2 (two) times daily. (Patient not taking: Reported on 09/05/2017)   . [DISCONTINUED] cephALEXin (KEFLEX) 500 MG capsule Take 1 capsule (500 mg total) by mouth 3 (three) times daily. (Patient not taking: Reported on 09/05/2017)   . [DISCONTINUED] cephALEXin (KEFLEX) 500 MG capsule Take 1 capsule (500 mg total) by mouth 3 (three) times daily. (Patient not taking: Reported on 09/05/2017)   . [DISCONTINUED] metFORMIN (GLUCOPHAGE) 500 MG tablet  09/22/2014: Received from: External Pharmacy  . [  DISCONTINUED] warfarin (COUMADIN) 4 MG tablet  09/05/2017: CVA   No facility-administered encounter medications on file as of 09/01/2017.    Functional Status:   In your present state of health, do you have any difficulty performing the following activities: 09/01/2017  Hearing? N  Vision? Y  Comment Right eye blindness  Difficulty concentrating or making decisions? N  Walking or climbing stairs? Y  Dressing or bathing? Y  Doing errands, shopping? Y  Preparing Food and eating ? N  Using the Toilet? N  In the past six months, have you accidently leaked urine? N  Do you have problems with loss of bowel control? N  Managing your Medications? Y  Managing your Finances? Y  Housekeeping or managing your Housekeeping? Y  Some recent data might be hidden     Fall/Depression Screening:    Fall Risk  09/01/2017  Falls in the past year? Yes  Number falls in past yr: 2 or more  Injury with Fall? 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   [facility]) Care Management is working in partnership with you to provide your patient with Disease Management, Transition of Care, Complex Care Management, and Wellness programs.          THN CM Care Plan Problem One     Most Recent Value  Care Plan Problem One  diabetic foot wound.  Role Documenting the Problem One  Care Management Coordinator  Care Plan for Problem One  Active  THN Long Term Goal   Pt to follow foot care instructions from specialist over the next 90 days.  THN Long Term Goal Start Date  09/01/17  Interventions for Problem One Long Term Goal  Pt described foot care regimen and demonstrated the process. Encouraged continued daily wound care. elevate L leg as much as possible to aid circulation.  THN CM Short Term Goal #1   Pt to attend specialty office visit next week.  THN CM Short Term Goal #1 Start Date  09/01/17  Interventions for Short Term Goal #1  Discussed importance of keeping his appointments. Ensured pt has transporation.    [facility] CM Care Plan Problem Two     Most Recent Value  Role Documenting the Problem Two  Care Management Coordinator      Kayleen Memos C. [name], MSN, GNP-BC Gerontological Nurse Practitioner [facility] Care Management [phone]  .

## 2017-09-05 ENCOUNTER — Encounter: Payer: Self-pay | Admitting: *Deleted

## 2017-09-06 DIAGNOSIS — L97511 Non-pressure chronic ulcer of other part of right foot limited to breakdown of skin: Secondary | ICD-10-CM | POA: Diagnosis not present

## 2017-09-06 DIAGNOSIS — I871 Compression of vein: Secondary | ICD-10-CM | POA: Diagnosis not present

## 2017-09-06 DIAGNOSIS — Z95828 Presence of other vascular implants and grafts: Secondary | ICD-10-CM | POA: Diagnosis not present

## 2017-09-11 ENCOUNTER — Other Ambulatory Visit: Payer: Self-pay | Admitting: *Deleted

## 2017-09-11 DIAGNOSIS — L03119 Cellulitis of unspecified part of limb: Secondary | ICD-10-CM | POA: Diagnosis not present

## 2017-09-11 DIAGNOSIS — R5381 Other malaise: Secondary | ICD-10-CM | POA: Diagnosis not present

## 2017-09-11 DIAGNOSIS — M47816 Spondylosis without myelopathy or radiculopathy, lumbar region: Secondary | ICD-10-CM | POA: Diagnosis not present

## 2017-09-11 DIAGNOSIS — L97922 Non-pressure chronic ulcer of unspecified part of left lower leg with fat layer exposed: Secondary | ICD-10-CM | POA: Diagnosis not present

## 2017-09-11 NOTE — Patient Outreach (Addendum)
Telephone outreach to follow up on pt's podiatry visit last week. William Buchanan states that the PA that saw him, assessed and treated his foot and gave him new instructions for the R foot care. He saw a prosthetist and was given a shrinker to start wearing on his [place]. He is waiting on a gentleman now that is coming to his home to assess him for a motorized wheelchair. He is pleased with these things finally coming to fruition. I asked if I could call him later to see how this assessment went and the outcome of it. He welcomed my interest and agreed for me to check in with him later today.  [name]. [name], MSN, GNP-BC Gerontological Nurse Practitioner [facility] Care Management [phone]  Mr. Hounshell called me back reporting to me about his wheel chair evaluation. He said the gentleman did a very thorough evaluation and assessed his in home needs. He was able to look at a catalog with pictures and he and the representative agreed on an appropriate model. I advised him I will be advocating for him to receive this piece of equipment. I will call Mr. Grimm in 2 weeks to check in on him.  [name]. [name], MSN, [facility] Gerontological Nurse Practitioner [facility] Care Management [phone]

## 2017-09-13 DIAGNOSIS — Z89612 Acquired absence of left leg above knee: Secondary | ICD-10-CM | POA: Diagnosis not present

## 2017-09-13 DIAGNOSIS — E11621 Type 2 diabetes mellitus with foot ulcer: Secondary | ICD-10-CM | POA: Diagnosis not present

## 2017-09-13 DIAGNOSIS — I252 Old myocardial infarction: Secondary | ICD-10-CM | POA: Diagnosis not present

## 2017-09-13 DIAGNOSIS — I10 Essential (primary) hypertension: Secondary | ICD-10-CM | POA: Diagnosis not present

## 2017-09-13 DIAGNOSIS — Z7984 Long term (current) use of oral hypoglycemic drugs: Secondary | ICD-10-CM | POA: Diagnosis not present

## 2017-09-13 DIAGNOSIS — Z7902 Long term (current) use of antithrombotics/antiplatelets: Secondary | ICD-10-CM | POA: Diagnosis not present

## 2017-09-13 DIAGNOSIS — L97511 Non-pressure chronic ulcer of other part of right foot limited to breakdown of skin: Secondary | ICD-10-CM | POA: Diagnosis not present

## 2017-09-13 DIAGNOSIS — I2510 Atherosclerotic heart disease of native coronary artery without angina pectoris: Secondary | ICD-10-CM | POA: Diagnosis not present

## 2017-09-13 DIAGNOSIS — Z9181 History of falling: Secondary | ICD-10-CM | POA: Diagnosis not present

## 2017-09-22 ENCOUNTER — Other Ambulatory Visit: Payer: Self-pay | Admitting: *Deleted

## 2017-09-22 NOTE — Patient Outreach (Signed)
Telephone call to follow up on pt diabetic foot ulcer and electric wheelchair status. Mr. William Buchanan says he is well. He is doing his own wound care now, washing the wound with soap and water and applying betadine and a dressing. He thinks it is doing fine. He has not heard anything about the status of the wheelchair approval.  I advised I would like to see him next week to do a foot assessment and that I will investigate what the wheelchair status is.  I have encouraged him to elevate his R leg as much as he can during the day to facilitate the best circulation for healing and he said he would do this. I also encouraged him to call me with any problems.  [name]. [name], MSN, [facility] Gerontological Nurse Practitioner [facility] Care Management [phone]

## 2017-09-26 NOTE — Patient Outreach (Signed)
Late entry for 09/22/17. Pt has not heard anything about his electric wheel chair being approved yet. He reports he is no longer seeing any home health providers and he is doing his own foot wound care: washing with soap and water, applying betadine and covering it with a bandage daily. He is feeling well otherwise. He requests a home visit for me to assess his foot wound. I have make an appt to see him on Wednesday February 27th at 10:00 am.  Kayleen Memos C. [name], MSN, [facility] Gerontological Nurse Practitioner [facility] Care Management [phone]

## 2017-09-27 ENCOUNTER — Other Ambulatory Visit: Payer: Self-pay | Admitting: *Deleted

## 2017-09-27 MED ORDER — DICLOFENAC SODIUM 1 % TD GEL: 2.0000 g | Freq: Four times a day (QID) | TRANSDERMAL | 11 refills | Status: DC

## 2017-09-27 NOTE — Patient Outreach (Addendum)
[facility]) Care Management   09/27/2017  William Buchanan 30-May-1934 [id number]  William Buchanan is an 84 y.o. male  Subjective:  Pt is doing very well and in good humor. He has his shrinker on his LLE stump. Complaining of R hand pain.  Objective:   Review of Systems  Constitutional: Negative.   HENT: Negative.   Eyes: Negative.   Respiratory: Negative.   Cardiovascular: Negative.   Gastrointestinal: Negative.   Genitourinary: Negative.   Musculoskeletal: Positive for myalgias.       R hand pain OA.  Skin: Negative.   Neurological: Positive for sensory change.       Phantom pain is a daily problem. R hand pain from OA.  Endo/Heme/Allergies: Negative.    BP 138/70 (BP Location: Left Arm, Patient Position: Sitting, Cuff Size: Normal)   Pulse 76   Resp 18   SpO2 96% NFBS: 251   Physical Exam  Constitutional: He is oriented to person, place, and time.  Respiratory: Effort normal and breath sounds normal.  GI: Soft. Bowel sounds are normal.  Musculoskeletal:  Contracture of Right hand and OA deformitiy.  Neurological: He is alert and oriented to person, place, and time.  Skin:  R foot wound has closed significantly. Only a small crescent moon shaped area at wound edge has not filled in. Pt washed the wound with soap and water, I applied betadine ointment and covered with dressing and secured with tape.  Psychiatric: He has a normal mood and affect.    Encounter Medications:   Outpatient Encounter Medications as of 09/27/2017  Medication Sig Note  . acetaminophen (TYLENOL) 500 MG tablet Take 500 mg by mouth every 4 (four) hours as needed for moderate pain.   Marland Kitchen albuterol (PROVENTIL HFA;VENTOLIN HFA) 108 (90 Base) MCG/ACT inhaler Inhale into the lungs every 6 (six) hours as needed for wheezing or shortness of breath.   Marland Kitchen aspirin EC 81 MG tablet Take 81 mg by mouth daily.   . clopidogrel (PLAVIX) 75 MG tablet Take 75 mg by mouth daily.   Marland Kitchen glipiZIDE  (GLUCOTROL) 10 MG tablet Take 5 mg by mouth.  09/05/2017: Taking 5 mg bid per grand daughter.  Marland Kitchen lisinopril-hydrochlorothiazide (PRINZIDE,ZESTORETIC) 20-25 MG per tablet  09/22/2014: Received from: External Pharmacy  . pantoprazole (PROTONIX) 40 MG tablet Take 40 mg by mouth daily.   . pioglitazone (ACTOS) 30 MG tablet  09/22/2014: Received from: External Pharmacy  . polyethylene glycol (MIRALAX / GLYCOLAX) packet Take 17 g by mouth daily.   . simvastatin (ZOCOR) 20 MG tablet  09/22/2014: Received from: External Pharmacy  . vitamin B-12 (CYANOCOBALAMIN) 250 MCG tablet Take 250 mcg by mouth daily.   . vitamin C (ASCORBIC ACID) 500 MG tablet Take 500 mg by mouth daily.    No facility-administered encounter medications on file as of 09/27/2017.     Functional Status:   In your present state of health, do you have any difficulty performing the following activities: 09/01/2017  Hearing? N  Vision? Y  Comment Right eye blindness  Difficulty concentrating or making decisions? N  Walking or climbing stairs? Y  Dressing or bathing? Y  Doing errands, shopping? Y  Preparing Food and eating ? N  Using the Toilet? N  In the past six months, have you accidently leaked urine? N  Do you have problems with loss of bowel control? N  Managing your Medications? Y  Managing your Finances? Y  Housekeeping or managing your Housekeeping? William Buchanan  Some recent data might be hidden    Fall/Depression Screening:    Fall Risk  09/01/2017  Falls in the past year? Yes  Number falls in past yr: 2 or more  Injury with Fall? No  Risk Factor Category  High Fall Risk  Risk for fall due to : Other (Comment)  Follow up Falls evaluation completed;Falls prevention discussed   PHQ 2/9 Scores 09/01/2017  PHQ - 2 Score 0   THN CM Care Plan Problem One     Most Recent Value  Care Plan Problem One  diabetic foot wound.  Role Documenting the Problem One  Care Management [place] for Problem One  Active  THN Long Term  Goal   Daughter to make new appt for the wound care center at desired center for an appt and be seen within the next 2 weeks.  THN Long Term Goal Start Date  09/27/17  Interventions for Problem One Long Term Goal  Telephonic reassessment as to the wound care pt is providing for himself. He is doing so daily and as directed.  THN CM Short Term Goal #1   Pt to attend specialty office visit next week.  THN CM Short Term Goal #1 Start Date  09/01/17  [facility] CM Short Term Goal #1 Met Date  09/06/17  Interventions for Short Term Goal #1  Pt attended his specialist visit. Advised his wound is stable.    [facility] CM Care Plan Problem Two     Most Recent Value  Care Plan Problem Two  Diabetes control not at goal.  Role Documenting the Problem Two  Care Management [place] for Problem Two  Active  Interventions for Problem Two Long Term Goal   Counseled on diet and carbohydrate intake and significance of high glucose levels and small vessel disease. Request to alter diet, checkNFBS and record. Will message MD to consider basal insulin.  THN Long Term Goal  Pt NFBS levels with be reduded to <200 over the next 6  THN Long Term Goal Start Date  09/27/17  [facility] CM Short Term Goal #1   Pt will check NFBS over the next 2 weeks and report to NP.  THN CM Short Term Goal #1 Start Date  09/27/17  Interventions for Short Term Goal #2   Gave instructions to pt and his daughter to check NFBS in addition to FBS for next 2 weeks.  THN CM Short Term Goal #2   Pt to reduce carbohydrate intake as evidenced by NFBS being < 200 over the next 2 weeks.  THN CM Short Term Goal #2 Start Date  09/28/17  Interventions for Short Term Goal #2  Discussed current usual meal choices and alternatives for healthier choices.      Assessment:  Diabetes                         Peripheral Vascular Disease with R foot wound                         Neuropathy  Plan: Rx voltaren gel 1% apply to R hand up to 4 times a day as needed.  1 tube, 11 refills.           Called [employer] to get update on electric wheelchair, had to leave a message to call back.           Called [employer] to get an  update on leg prosthetic update - provider was out of town and was told to call next week.           Foot assessment and wound care. Provided some dressing supplies (2X2s, betadine ointment and a roll of tape)           Continue daily wound care. Try to elevate foot as much as possible during the day to facilitate improved circulation.           Start checking glucose in the evening daily in addition to am and record in [facility] calendar.         **Make an appt with wound center and primary care. Discuss long acting insulin possibility to improve nfbs levels.            Counseled on ADA diet and curbing excess of carbohydrates in his current diet.  I will call pt in 2 weeks and see him in one month.   [name]. [name], MSN, [facility] Gerontological Nurse Practitioner [facility] Care Management [phone]

## 2017-10-09 ENCOUNTER — Other Ambulatory Visit: Payer: Self-pay | Admitting: *Deleted

## 2017-10-09 DIAGNOSIS — L03119 Cellulitis of unspecified part of limb: Secondary | ICD-10-CM | POA: Diagnosis not present

## 2017-10-09 DIAGNOSIS — R5381 Other malaise: Secondary | ICD-10-CM | POA: Diagnosis not present

## 2017-10-09 DIAGNOSIS — M47816 Spondylosis without myelopathy or radiculopathy, lumbar region: Secondary | ICD-10-CM | POA: Diagnosis not present

## 2017-10-09 DIAGNOSIS — L97922 Non-pressure chronic ulcer of unspecified part of left lower leg with fat layer exposed: Secondary | ICD-10-CM | POA: Diagnosis not present

## 2017-10-09 DIAGNOSIS — S60562A Insect bite (nonvenomous) of left hand, initial encounter: Secondary | ICD-10-CM | POA: Diagnosis not present

## 2017-10-09 NOTE — Patient Outreach (Signed)
Called [employer] to find out about the progress on getting pt's electric wheelchair. Had to leave another message and request return call for an update.  [name]. [name], MSN, [facility] Gerontological Nurse Practitioner [facility] Care Management [phone]

## 2017-10-10 NOTE — Patient Outreach (Signed)
Called and left a 2nd message requesting a return call to update on progress of getting pt his electric wheelchair.  [name]. [name], MSN, [facility] Gerontological Nurse Practitioner [facility] Care Management [phone]

## 2017-10-11 ENCOUNTER — Other Ambulatory Visit: Payer: Self-pay | Admitting: *Deleted

## 2017-10-11 DIAGNOSIS — M2041 Other hammer toe(s) (acquired), right foot: Secondary | ICD-10-CM | POA: Diagnosis not present

## 2017-10-11 DIAGNOSIS — M7989 Other specified soft tissue disorders: Secondary | ICD-10-CM | POA: Diagnosis not present

## 2017-10-11 DIAGNOSIS — M2011 Hallux valgus (acquired), right foot: Secondary | ICD-10-CM | POA: Diagnosis not present

## 2017-10-11 DIAGNOSIS — L97513 Non-pressure chronic ulcer of other part of right foot with necrosis of muscle: Secondary | ICD-10-CM | POA: Diagnosis not present

## 2017-10-11 DIAGNOSIS — L97512 Non-pressure chronic ulcer of other part of right foot with fat layer exposed: Secondary | ICD-10-CM | POA: Diagnosis not present

## 2017-10-11 DIAGNOSIS — M19071 Primary osteoarthritis, right ankle and foot: Secondary | ICD-10-CM | POA: Diagnosis not present

## 2017-10-11 DIAGNOSIS — E1142 Type 2 diabetes mellitus with diabetic polyneuropathy: Secondary | ICD-10-CM | POA: Diagnosis not present

## 2017-10-11 DIAGNOSIS — E11621 Type 2 diabetes mellitus with foot ulcer: Secondary | ICD-10-CM | POA: Diagnosis not present

## 2017-10-11 DIAGNOSIS — I70293 Other atherosclerosis of native arteries of extremities, bilateral legs: Secondary | ICD-10-CM | POA: Diagnosis not present

## 2017-10-11 DIAGNOSIS — I70203 Unspecified atherosclerosis of native arteries of extremities, bilateral legs: Secondary | ICD-10-CM | POA: Diagnosis not present

## 2017-10-11 NOTE — Patient Outreach (Signed)
Follow up phone call to reassess pt foot care needs and update pt on progress of getting his electric wheelchair. I did speak to Page at [facility] who advised the approval process is still ongoing. She will be sending paper work to Dr. Andria Rhein today and she requires more information from him before the request goes to [facility].  Pt was only able to speak a few minutes, he is at the wound clinic in [place] currently. He asked that I call him back later on today.  [name]. [name], MSN, [facility] Gerontological Nurse Practitioner [facility] Care Management [phone]

## 2017-10-12 ENCOUNTER — Other Ambulatory Visit: Payer: Self-pay | Admitting: *Deleted

## 2017-10-12 NOTE — Patient Outreach (Signed)
Call to pt to find out about the outcome of his wound clinic evaluation. Pt did not answer the phone and I left a message for him to return my call.  [name]. [name], MSN, [facility] Gerontological Nurse Practitioner [facility] Care Management [phone]

## 2017-10-18 DIAGNOSIS — I998 Other disorder of circulatory system: Secondary | ICD-10-CM | POA: Diagnosis not present

## 2017-10-18 DIAGNOSIS — L97513 Non-pressure chronic ulcer of other part of right foot with necrosis of muscle: Secondary | ICD-10-CM | POA: Diagnosis not present

## 2017-10-18 DIAGNOSIS — E1142 Type 2 diabetes mellitus with diabetic polyneuropathy: Secondary | ICD-10-CM | POA: Diagnosis not present

## 2017-10-18 DIAGNOSIS — I70203 Unspecified atherosclerosis of native arteries of extremities, bilateral legs: Secondary | ICD-10-CM | POA: Diagnosis not present

## 2017-10-18 DIAGNOSIS — E11621 Type 2 diabetes mellitus with foot ulcer: Secondary | ICD-10-CM | POA: Diagnosis not present

## 2017-10-18 DIAGNOSIS — L97516 Non-pressure chronic ulcer of other part of right foot with bone involvement without evidence of necrosis: Secondary | ICD-10-CM | POA: Diagnosis not present

## 2017-10-18 DIAGNOSIS — I70213 Atherosclerosis of native arteries of extremities with intermittent claudication, bilateral legs: Secondary | ICD-10-CM | POA: Diagnosis not present

## 2017-10-21 DIAGNOSIS — R300 Dysuria: Secondary | ICD-10-CM | POA: Diagnosis not present

## 2017-10-25 ENCOUNTER — Ambulatory Visit: Payer: Self-pay | Admitting: *Deleted

## 2017-10-26 DIAGNOSIS — E11621 Type 2 diabetes mellitus with foot ulcer: Secondary | ICD-10-CM | POA: Diagnosis not present

## 2017-10-26 DIAGNOSIS — E08621 Diabetes mellitus due to underlying condition with foot ulcer: Secondary | ICD-10-CM | POA: Diagnosis not present

## 2017-10-26 DIAGNOSIS — L97516 Non-pressure chronic ulcer of other part of right foot with bone involvement without evidence of necrosis: Secondary | ICD-10-CM | POA: Diagnosis not present

## 2017-10-27 DIAGNOSIS — Z89612 Acquired absence of left leg above knee: Secondary | ICD-10-CM | POA: Diagnosis not present

## 2017-11-02 DIAGNOSIS — E11621 Type 2 diabetes mellitus with foot ulcer: Secondary | ICD-10-CM | POA: Diagnosis not present

## 2017-11-02 DIAGNOSIS — L97416 Non-pressure chronic ulcer of right heel and midfoot with bone involvement without evidence of necrosis: Secondary | ICD-10-CM | POA: Diagnosis not present

## 2017-11-02 DIAGNOSIS — L97516 Non-pressure chronic ulcer of other part of right foot with bone involvement without evidence of necrosis: Secondary | ICD-10-CM | POA: Diagnosis not present

## 2017-11-07 ENCOUNTER — Other Ambulatory Visit: Payer: Self-pay | Admitting: *Deleted

## 2017-11-07 NOTE — Patient Outreach (Signed)
Follow up High Risk [facility] patient with diabetic foot wound. William Buchanan called me back today and advised he is seeing the foot MD frequently. He also says he has his new leg prosthetic but he can't really use it because he is not supposed to walk on his L foot with the wound at this time.  He is not checking his glucose levels. He has been on an antibiotic for a UTI.  He has several MD appts this week. I will call him next Monday to see if we can schedule a home visit.  William Pont. [name], MSN, [facility] Gerontological Nurse Practitioner [facility] Care Management [phone]

## 2017-11-07 NOTE — Patient Outreach (Signed)
[facility]A HIgh Risk patient follow up. I called but William Buchanan did not answer the phone. I did leave him a message to return my call.  [name]. [name], MSN, [facility] Gerontological Nurse Practitioner [facility] Care Management [phone]

## 2017-11-08 DIAGNOSIS — I10 Essential (primary) hypertension: Secondary | ICD-10-CM | POA: Diagnosis not present

## 2017-11-08 DIAGNOSIS — I214 Non-ST elevation (NSTEMI) myocardial infarction: Secondary | ICD-10-CM | POA: Diagnosis not present

## 2017-11-08 DIAGNOSIS — I6523 Occlusion and stenosis of bilateral carotid arteries: Secondary | ICD-10-CM | POA: Diagnosis not present

## 2017-11-08 DIAGNOSIS — I70203 Unspecified atherosclerosis of native arteries of extremities, bilateral legs: Secondary | ICD-10-CM | POA: Diagnosis not present

## 2017-11-09 DIAGNOSIS — L03119 Cellulitis of unspecified part of limb: Secondary | ICD-10-CM | POA: Diagnosis not present

## 2017-11-09 DIAGNOSIS — L97922 Non-pressure chronic ulcer of unspecified part of left lower leg with fat layer exposed: Secondary | ICD-10-CM | POA: Diagnosis not present

## 2017-11-09 DIAGNOSIS — M47816 Spondylosis without myelopathy or radiculopathy, lumbar region: Secondary | ICD-10-CM | POA: Diagnosis not present

## 2017-11-09 DIAGNOSIS — L97415 Non-pressure chronic ulcer of right heel and midfoot with muscle involvement without evidence of necrosis: Secondary | ICD-10-CM | POA: Diagnosis not present

## 2017-11-09 DIAGNOSIS — L97515 Non-pressure chronic ulcer of other part of right foot with muscle involvement without evidence of necrosis: Secondary | ICD-10-CM | POA: Diagnosis not present

## 2017-11-09 DIAGNOSIS — E11621 Type 2 diabetes mellitus with foot ulcer: Secondary | ICD-10-CM | POA: Diagnosis not present

## 2017-11-09 DIAGNOSIS — R5381 Other malaise: Secondary | ICD-10-CM | POA: Diagnosis not present

## 2017-11-13 ENCOUNTER — Other Ambulatory Visit: Payer: Self-pay | Admitting: *Deleted

## 2017-11-13 NOTE — Patient Outreach (Signed)
Telephone assessment. Pt reports he has been getting his R foot wound debrided a couple of times a week and he has a wound vac. He is unsure about the prognosis for his foot. He says he just tries to do what they tell him to do.  I will call him again next week. I would like to go visit him, perhaps I will see if I can go with him to the wound center.  [name]. [name], MSN, [facility] Gerontological Nurse Practitioner [facility] Care Management [phone]

## 2017-11-16 DIAGNOSIS — L97515 Non-pressure chronic ulcer of other part of right foot with muscle involvement without evidence of necrosis: Secondary | ICD-10-CM | POA: Diagnosis not present

## 2017-11-16 DIAGNOSIS — E11621 Type 2 diabetes mellitus with foot ulcer: Secondary | ICD-10-CM | POA: Diagnosis not present

## 2017-11-20 ENCOUNTER — Other Ambulatory Visit: Payer: Self-pay | Admitting: *Deleted

## 2017-11-20 NOTE — Patient Outreach (Signed)
Telephone assessment. Pt states he has been released to do his own foot care for his R foot wound which he is doing every other day. He states he is supposed to go to a podiatrist in [facility] which one of his doctors is arranging. He has agreed to a home visit tomorrow at 10:00 so I will see him in person at that time.  [name]. [name], MSN, [facility] Gerontological Nurse Practitioner [facility] Care Management [phone]

## 2017-11-21 ENCOUNTER — Other Ambulatory Visit: Payer: Self-pay | Admitting: *Deleted

## 2017-11-21 NOTE — Patient Outreach (Signed)
[facility]) Care Management  11/21/2017  SPERO GUNNELS 15-Jun-1934 [id number]   Home visit. Pt has his new prosthetic for L AKA, however he has been instructed to keep his R leg elevated. He says they are considering doing a skin graft to his wound area. He is doing his own wound care now every other day. He cleans it with soap and water, applies some ioodoflex into the wound and covers it with a 2 X 2 and secures it with tape. He wears a sock and an orhopedic shoe that does not put pressure on the area. He is still waiting for his electric wheelchair.   BP (!) 128/50 (BP Location: Right Arm, Patient Position: Sitting, Cuff Size: Normal)   Pulse 68   Resp 18   SpO2 98%  NFBS 256  RRR Lungs are clear R forefoot wound clean, moist small matchhead size hole in center.  Diabetes with wound PVD  Plan: Pt to inform me of his progress and procedures. I will call him in 2 weeks. Encouraged to modify his diet, avoid concentrated sweets. He states he "ain't gonna do it, my quality of life is so bad I just want to eat what I want."  Kayleen Memos C. [name], MSN, [facility] Gerontological Nurse Practitioner [facility] Care Management [phone]

## 2017-11-28 DIAGNOSIS — L97515 Non-pressure chronic ulcer of other part of right foot with muscle involvement without evidence of necrosis: Secondary | ICD-10-CM | POA: Diagnosis not present

## 2017-11-28 DIAGNOSIS — E1142 Type 2 diabetes mellitus with diabetic polyneuropathy: Secondary | ICD-10-CM | POA: Diagnosis not present

## 2017-11-28 DIAGNOSIS — E11621 Type 2 diabetes mellitus with foot ulcer: Secondary | ICD-10-CM | POA: Diagnosis not present

## 2017-11-28 DIAGNOSIS — M21611 Bunion of right foot: Secondary | ICD-10-CM | POA: Diagnosis not present

## 2017-11-30 DIAGNOSIS — E11621 Type 2 diabetes mellitus with foot ulcer: Secondary | ICD-10-CM | POA: Diagnosis not present

## 2017-11-30 DIAGNOSIS — M21611 Bunion of right foot: Secondary | ICD-10-CM | POA: Diagnosis not present

## 2017-11-30 DIAGNOSIS — L97515 Non-pressure chronic ulcer of other part of right foot with muscle involvement without evidence of necrosis: Secondary | ICD-10-CM | POA: Diagnosis not present

## 2017-11-30 DIAGNOSIS — Z89512 Acquired absence of left leg below knee: Secondary | ICD-10-CM | POA: Diagnosis not present

## 2017-11-30 DIAGNOSIS — L97512 Non-pressure chronic ulcer of other part of right foot with fat layer exposed: Secondary | ICD-10-CM | POA: Diagnosis not present

## 2017-12-01 DIAGNOSIS — Z7409 Other reduced mobility: Secondary | ICD-10-CM | POA: Diagnosis not present

## 2017-12-01 DIAGNOSIS — E08621 Diabetes mellitus due to underlying condition with foot ulcer: Secondary | ICD-10-CM | POA: Diagnosis not present

## 2017-12-01 DIAGNOSIS — Z89512 Acquired absence of left leg below knee: Secondary | ICD-10-CM | POA: Diagnosis not present

## 2017-12-01 DIAGNOSIS — E1142 Type 2 diabetes mellitus with diabetic polyneuropathy: Secondary | ICD-10-CM | POA: Diagnosis not present

## 2017-12-07 DIAGNOSIS — J209 Acute bronchitis, unspecified: Secondary | ICD-10-CM | POA: Diagnosis not present

## 2017-12-09 DIAGNOSIS — R5381 Other malaise: Secondary | ICD-10-CM | POA: Diagnosis not present

## 2017-12-09 DIAGNOSIS — L97922 Non-pressure chronic ulcer of unspecified part of left lower leg with fat layer exposed: Secondary | ICD-10-CM | POA: Diagnosis not present

## 2017-12-09 DIAGNOSIS — M47816 Spondylosis without myelopathy or radiculopathy, lumbar region: Secondary | ICD-10-CM | POA: Diagnosis not present

## 2017-12-09 DIAGNOSIS — L03119 Cellulitis of unspecified part of limb: Secondary | ICD-10-CM | POA: Diagnosis not present

## 2017-12-14 DIAGNOSIS — E08621 Diabetes mellitus due to underlying condition with foot ulcer: Secondary | ICD-10-CM | POA: Diagnosis not present

## 2017-12-14 DIAGNOSIS — Z89512 Acquired absence of left leg below knee: Secondary | ICD-10-CM | POA: Diagnosis not present

## 2017-12-14 DIAGNOSIS — M21611 Bunion of right foot: Secondary | ICD-10-CM | POA: Diagnosis not present

## 2017-12-14 DIAGNOSIS — L97515 Non-pressure chronic ulcer of other part of right foot with muscle involvement without evidence of necrosis: Secondary | ICD-10-CM | POA: Diagnosis not present

## 2017-12-20 DIAGNOSIS — M47816 Spondylosis without myelopathy or radiculopathy, lumbar region: Secondary | ICD-10-CM | POA: Diagnosis not present

## 2017-12-20 DIAGNOSIS — Z89512 Acquired absence of left leg below knee: Secondary | ICD-10-CM | POA: Diagnosis not present

## 2017-12-20 DIAGNOSIS — R5381 Other malaise: Secondary | ICD-10-CM | POA: Diagnosis not present

## 2017-12-20 DIAGNOSIS — L97922 Non-pressure chronic ulcer of unspecified part of left lower leg with fat layer exposed: Secondary | ICD-10-CM | POA: Diagnosis not present

## 2017-12-20 DIAGNOSIS — L03119 Cellulitis of unspecified part of limb: Secondary | ICD-10-CM | POA: Diagnosis not present

## 2017-12-28 ENCOUNTER — Other Ambulatory Visit: Payer: Self-pay | Admitting: *Deleted

## 2017-12-28 DIAGNOSIS — E1142 Type 2 diabetes mellitus with diabetic polyneuropathy: Secondary | ICD-10-CM | POA: Diagnosis not present

## 2017-12-28 DIAGNOSIS — M21611 Bunion of right foot: Secondary | ICD-10-CM | POA: Diagnosis not present

## 2017-12-28 DIAGNOSIS — I739 Peripheral vascular disease, unspecified: Secondary | ICD-10-CM | POA: Diagnosis not present

## 2017-12-28 DIAGNOSIS — E11621 Type 2 diabetes mellitus with foot ulcer: Secondary | ICD-10-CM | POA: Diagnosis not present

## 2017-12-28 NOTE — Patient Outreach (Signed)
Telephone call from Blair returned. He reports he has his electric wheelchair now and is so pleased and he has so much more freedom with this assistance. He is going to the podiatrist today. He thinks that the specialist wants to shave down the MT joint but he doesn't understand why he wants to do that when it seems like it is healing. He also reports his glucose levels have been elevated and he has been put back on metformin.  I have encouraged him to ask all the questions in his mind, write them down so he can remember so he can ask them when he sees the doctor.  I have also encouraged him to try to be compliant with his diabetic diet, use good judgement about where he goes in the wheelchair to avoid safety hazards and to call me if he needs to. I will call him again in 2 weeks.  [name]. [name], MSN, [facility] Gerontological Nurse Practitioner [facility] Care Management [phone]

## 2017-12-29 DIAGNOSIS — J441 Chronic obstructive pulmonary disease with (acute) exacerbation: Secondary | ICD-10-CM | POA: Diagnosis not present

## 2018-01-05 DIAGNOSIS — L97519 Non-pressure chronic ulcer of other part of right foot with unspecified severity: Secondary | ICD-10-CM | POA: Diagnosis not present

## 2018-01-05 DIAGNOSIS — E08621 Diabetes mellitus due to underlying condition with foot ulcer: Secondary | ICD-10-CM | POA: Diagnosis not present

## 2018-01-05 DIAGNOSIS — L97515 Non-pressure chronic ulcer of other part of right foot with muscle involvement without evidence of necrosis: Secondary | ICD-10-CM | POA: Diagnosis not present

## 2018-01-05 DIAGNOSIS — M21611 Bunion of right foot: Secondary | ICD-10-CM | POA: Diagnosis not present

## 2018-01-12 ENCOUNTER — Other Ambulatory Visit: Payer: Self-pay | Admitting: *Deleted

## 2018-01-12 DIAGNOSIS — L723 Sebaceous cyst: Secondary | ICD-10-CM | POA: Diagnosis not present

## 2018-01-12 NOTE — Patient Outreach (Addendum)
Telephone assessment attempted, William Buchanan did not answer, left voice mail requesting a return call.  [name]. [name], MSN, GNP-BC Gerontological Nurse Practitioner [facility] Care Management [phone]  William Buchanan returned my call. He reports he did have a R foot bunionectomy on 01/05/18 and his foot is healing well according to his doctor.  William Buchanan also reports having a boil in his arm pit that is swollen and tender. He is going to the doctor today to have it examined and treated. He reports he has had these before and he doesn't look forward to the treatment but it must be done.  I will call him again in 2 weeks.  [name]. [name], MSN, [facility] Gerontological Nurse Practitioner [facility] Care Management [phone]

## 2018-01-20 DIAGNOSIS — T8131XA Disruption of external operation (surgical) wound, not elsewhere classified, initial encounter: Secondary | ICD-10-CM | POA: Diagnosis not present

## 2018-01-20 DIAGNOSIS — R5381 Other malaise: Secondary | ICD-10-CM | POA: Diagnosis not present

## 2018-01-20 DIAGNOSIS — Y838 Other surgical procedures as the cause of abnormal reaction of the patient, or of later complication, without mention of misadventure at the time of the procedure: Secondary | ICD-10-CM | POA: Diagnosis not present

## 2018-01-20 DIAGNOSIS — Z89512 Acquired absence of left leg below knee: Secondary | ICD-10-CM | POA: Diagnosis not present

## 2018-01-20 DIAGNOSIS — M47816 Spondylosis without myelopathy or radiculopathy, lumbar region: Secondary | ICD-10-CM | POA: Diagnosis not present

## 2018-01-20 DIAGNOSIS — L03119 Cellulitis of unspecified part of limb: Secondary | ICD-10-CM | POA: Diagnosis not present

## 2018-01-20 DIAGNOSIS — L97922 Non-pressure chronic ulcer of unspecified part of left lower leg with fat layer exposed: Secondary | ICD-10-CM | POA: Diagnosis not present

## 2018-01-22 DIAGNOSIS — L723 Sebaceous cyst: Secondary | ICD-10-CM | POA: Diagnosis not present

## 2018-01-22 DIAGNOSIS — E1142 Type 2 diabetes mellitus with diabetic polyneuropathy: Secondary | ICD-10-CM | POA: Diagnosis not present

## 2018-01-23 ENCOUNTER — Other Ambulatory Visit: Payer: Self-pay | Admitting: *Deleted

## 2018-01-23 NOTE — Patient Outreach (Signed)
Called pt after receiving nurse line message pt is worried about his R foot which is showing some signs of complications he thinks. I was not able to reach him this morning, but have left a message for him to call me back!  [name]. [name], MSN, [facility] Gerontological Nurse Practitioner [facility] Care Management [phone]

## 2018-01-26 ENCOUNTER — Other Ambulatory Visit: Payer: Self-pay | Admitting: *Deleted

## 2018-01-26 NOTE — Patient Outreach (Addendum)
Telephone assessment, follow up bunionectomy. Pt reports he is not doing well. His surgical site dehisced Saturday and he went to the ED and they cleaned it up and bandaged it. He does not go back to his surgeon until Tuesday. He continues to do the wound care himself but he says the wound is wide open. He voices his frustration because really didn't want to do this surgery, but he felt pressure from the orthopedist and family to go ahead and get it done. Now, he feels like when he goes in they will tell him he has to have his foot amputated and he was hoping to not have another amputation.  I offered support and told him he can vent to me any time. I will call him next Friday to see what the outcome was to his appointment.  [name]. [name], MSN, [facility] Gerontological Nurse Practitioner [facility] Care Management [phone]

## 2018-01-30 DIAGNOSIS — L97515 Non-pressure chronic ulcer of other part of right foot with muscle involvement without evidence of necrosis: Secondary | ICD-10-CM | POA: Diagnosis not present

## 2018-01-30 DIAGNOSIS — E11621 Type 2 diabetes mellitus with foot ulcer: Secondary | ICD-10-CM | POA: Diagnosis not present

## 2018-01-31 ENCOUNTER — Other Ambulatory Visit: Payer: Self-pay | Admitting: *Deleted

## 2018-01-31 NOTE — Patient Outreach (Signed)
Telephone assessment. No answer, left message and requested a return call.  [name]. [name], MSN, [facility] Gerontological Nurse Practitioner [facility] Care Management [phone]

## 2018-02-06 DIAGNOSIS — E11621 Type 2 diabetes mellitus with foot ulcer: Secondary | ICD-10-CM | POA: Diagnosis not present

## 2018-02-06 DIAGNOSIS — L97515 Non-pressure chronic ulcer of other part of right foot with muscle involvement without evidence of necrosis: Secondary | ICD-10-CM | POA: Diagnosis not present

## 2018-02-14 DIAGNOSIS — I739 Peripheral vascular disease, unspecified: Secondary | ICD-10-CM | POA: Diagnosis not present

## 2018-02-14 DIAGNOSIS — Z9862 Peripheral vascular angioplasty status: Secondary | ICD-10-CM | POA: Diagnosis not present

## 2018-02-14 DIAGNOSIS — I70201 Unspecified atherosclerosis of native arteries of extremities, right leg: Secondary | ICD-10-CM | POA: Diagnosis not present

## 2018-02-14 DIAGNOSIS — I679 Cerebrovascular disease, unspecified: Secondary | ICD-10-CM | POA: Diagnosis not present

## 2018-02-14 DIAGNOSIS — I998 Other disorder of circulatory system: Secondary | ICD-10-CM | POA: Diagnosis not present

## 2018-02-14 DIAGNOSIS — I6523 Occlusion and stenosis of bilateral carotid arteries: Secondary | ICD-10-CM | POA: Diagnosis not present

## 2018-02-14 DIAGNOSIS — Z89512 Acquired absence of left leg below knee: Secondary | ICD-10-CM | POA: Diagnosis not present

## 2018-02-19 DIAGNOSIS — L97922 Non-pressure chronic ulcer of unspecified part of left lower leg with fat layer exposed: Secondary | ICD-10-CM | POA: Diagnosis not present

## 2018-02-19 DIAGNOSIS — R5381 Other malaise: Secondary | ICD-10-CM | POA: Diagnosis not present

## 2018-02-19 DIAGNOSIS — M47816 Spondylosis without myelopathy or radiculopathy, lumbar region: Secondary | ICD-10-CM | POA: Diagnosis not present

## 2018-02-19 DIAGNOSIS — L03119 Cellulitis of unspecified part of limb: Secondary | ICD-10-CM | POA: Diagnosis not present

## 2018-02-19 DIAGNOSIS — Z89512 Acquired absence of left leg below knee: Secondary | ICD-10-CM | POA: Diagnosis not present

## 2018-02-20 DIAGNOSIS — E11621 Type 2 diabetes mellitus with foot ulcer: Secondary | ICD-10-CM | POA: Diagnosis not present

## 2018-02-20 DIAGNOSIS — L97515 Non-pressure chronic ulcer of other part of right foot with muscle involvement without evidence of necrosis: Secondary | ICD-10-CM | POA: Diagnosis not present

## 2018-03-08 ENCOUNTER — Other Ambulatory Visit: Payer: Self-pay | Admitting: *Deleted

## 2018-03-08 NOTE — Patient Outreach (Signed)
Telephone outreach to follow up on pt's diabetes and PVD, R foot wound. Left messages on home and cell phone to return my call at his convenience.  William Buchanan. [name], MSN, [facility] Gerontological Nurse Practitioner [facility] Care Management [phone]

## 2018-03-13 DIAGNOSIS — E11621 Type 2 diabetes mellitus with foot ulcer: Secondary | ICD-10-CM | POA: Diagnosis not present

## 2018-03-13 DIAGNOSIS — L97515 Non-pressure chronic ulcer of other part of right foot with muscle involvement without evidence of necrosis: Secondary | ICD-10-CM | POA: Diagnosis not present

## 2018-03-16 ENCOUNTER — Other Ambulatory Visit: Payer: Self-pay | Admitting: *Deleted

## 2018-03-16 NOTE — Patient Outreach (Signed)
Returned phone call from Mr. Bloyd. He reports he is doing fair. He says his foot seems to be making some improvements. His glucose levels are in better control as he is making a point to comply with his diabetic diet. He is wearing his prosthetic leg some now. He is usually in his electric wheel chair which he loves and has really improved his quality of life.  I will schedule the pt for a home visit next month and we will discuss case closure at that time.  [name]. [name], MSN, [facility] Gerontological Nurse Practitioner [facility] Care Management [phone]

## 2018-03-22 DIAGNOSIS — R5381 Other malaise: Secondary | ICD-10-CM | POA: Diagnosis not present

## 2018-03-22 DIAGNOSIS — Z89512 Acquired absence of left leg below knee: Secondary | ICD-10-CM | POA: Diagnosis not present

## 2018-03-22 DIAGNOSIS — M47816 Spondylosis without myelopathy or radiculopathy, lumbar region: Secondary | ICD-10-CM | POA: Diagnosis not present

## 2018-03-22 DIAGNOSIS — L97922 Non-pressure chronic ulcer of unspecified part of left lower leg with fat layer exposed: Secondary | ICD-10-CM | POA: Diagnosis not present

## 2018-03-22 DIAGNOSIS — L03119 Cellulitis of unspecified part of limb: Secondary | ICD-10-CM | POA: Diagnosis not present

## 2018-04-03 DIAGNOSIS — L97515 Non-pressure chronic ulcer of other part of right foot with muscle involvement without evidence of necrosis: Secondary | ICD-10-CM | POA: Diagnosis not present

## 2018-04-03 DIAGNOSIS — E11621 Type 2 diabetes mellitus with foot ulcer: Secondary | ICD-10-CM | POA: Diagnosis not present

## 2018-04-17 ENCOUNTER — Ambulatory Visit: Payer: PPO | Admitting: *Deleted

## 2018-04-17 ENCOUNTER — Other Ambulatory Visit: Payer: Self-pay | Admitting: *Deleted

## 2018-04-17 DIAGNOSIS — Z89612 Acquired absence of left leg above knee: Secondary | ICD-10-CM | POA: Diagnosis not present

## 2018-04-17 DIAGNOSIS — Z9181 History of falling: Secondary | ICD-10-CM | POA: Diagnosis not present

## 2018-04-17 DIAGNOSIS — R531 Weakness: Secondary | ICD-10-CM | POA: Diagnosis not present

## 2018-04-17 DIAGNOSIS — Z89421 Acquired absence of other right toe(s): Secondary | ICD-10-CM | POA: Diagnosis not present

## 2018-04-17 DIAGNOSIS — R2681 Unsteadiness on feet: Secondary | ICD-10-CM | POA: Diagnosis not present

## 2018-04-17 NOTE — Patient Outreach (Addendum)
[name]) Care Management  04/17/2018  [name] 20-Aug-1933 [id number]   Discharge home visit.  Pt is doing fair. He goes to the wound center every 3 weeks. Pt does his own wound care to R metatarsal medial head (washes with saline, drys and then applies Santyl into the fundus of the wound and covers with with a 2X2 and secures with tape.   New electric WC is not working for the last month. It is only 7 months old, delivered 12/20/17. He was told he had to buy a new battery which still did not make the [place] work. Technician was sent but he didn't do anything but tell him he needed a new battery   Blood glucose levels are in very good control. His FBS range 91-131.   Plan will call [employer] to repair WC. William Buchanan [phone]  Pt self care has improved. I am closing his case today. Pt knows he can call me for future needs.  William Buchanan. William Neither, MSN, [facility] Gerontological Nurse Practitioner [facility] Care Management [phone]

## 2018-04-18 ENCOUNTER — Encounter: Payer: Self-pay | Admitting: *Deleted

## 2018-04-22 DIAGNOSIS — M47816 Spondylosis without myelopathy or radiculopathy, lumbar region: Secondary | ICD-10-CM | POA: Diagnosis not present

## 2018-04-22 DIAGNOSIS — R5381 Other malaise: Secondary | ICD-10-CM | POA: Diagnosis not present

## 2018-04-22 DIAGNOSIS — L03119 Cellulitis of unspecified part of limb: Secondary | ICD-10-CM | POA: Diagnosis not present

## 2018-04-22 DIAGNOSIS — L97922 Non-pressure chronic ulcer of unspecified part of left lower leg with fat layer exposed: Secondary | ICD-10-CM | POA: Diagnosis not present

## 2018-04-22 DIAGNOSIS — Z89512 Acquired absence of left leg below knee: Secondary | ICD-10-CM | POA: Diagnosis not present

## 2018-04-24 DIAGNOSIS — E1151 Type 2 diabetes mellitus with diabetic peripheral angiopathy without gangrene: Secondary | ICD-10-CM | POA: Diagnosis not present

## 2018-04-24 DIAGNOSIS — I739 Peripheral vascular disease, unspecified: Secondary | ICD-10-CM | POA: Diagnosis not present

## 2018-04-24 DIAGNOSIS — E11621 Type 2 diabetes mellitus with foot ulcer: Secondary | ICD-10-CM | POA: Diagnosis not present

## 2018-04-24 DIAGNOSIS — Z7984 Long term (current) use of oral hypoglycemic drugs: Secondary | ICD-10-CM | POA: Diagnosis not present

## 2018-04-24 DIAGNOSIS — Z89512 Acquired absence of left leg below knee: Secondary | ICD-10-CM | POA: Diagnosis not present

## 2018-04-24 DIAGNOSIS — L97515 Non-pressure chronic ulcer of other part of right foot with muscle involvement without evidence of necrosis: Secondary | ICD-10-CM | POA: Diagnosis not present

## 2018-04-24 DIAGNOSIS — E1142 Type 2 diabetes mellitus with diabetic polyneuropathy: Secondary | ICD-10-CM | POA: Diagnosis not present

## 2018-04-26 DIAGNOSIS — K219 Gastro-esophageal reflux disease without esophagitis: Secondary | ICD-10-CM | POA: Diagnosis not present

## 2018-04-26 DIAGNOSIS — E1142 Type 2 diabetes mellitus with diabetic polyneuropathy: Secondary | ICD-10-CM | POA: Diagnosis not present

## 2018-04-26 DIAGNOSIS — Z89512 Acquired absence of left leg below knee: Secondary | ICD-10-CM | POA: Diagnosis not present

## 2018-04-26 DIAGNOSIS — E7800 Pure hypercholesterolemia, unspecified: Secondary | ICD-10-CM | POA: Diagnosis not present

## 2018-04-26 DIAGNOSIS — I10 Essential (primary) hypertension: Secondary | ICD-10-CM | POA: Diagnosis not present

## 2018-04-26 DIAGNOSIS — B356 Tinea cruris: Secondary | ICD-10-CM | POA: Diagnosis not present

## 2018-04-26 DIAGNOSIS — Z0000 Encounter for general adult medical examination without abnormal findings: Secondary | ICD-10-CM | POA: Diagnosis not present

## 2018-05-08 DIAGNOSIS — R2681 Unsteadiness on feet: Secondary | ICD-10-CM | POA: Diagnosis not present

## 2018-05-08 DIAGNOSIS — Z9181 History of falling: Secondary | ICD-10-CM | POA: Diagnosis not present

## 2018-05-08 DIAGNOSIS — Z89612 Acquired absence of left leg above knee: Secondary | ICD-10-CM | POA: Diagnosis not present

## 2018-05-08 DIAGNOSIS — R531 Weakness: Secondary | ICD-10-CM | POA: Diagnosis not present

## 2018-05-08 DIAGNOSIS — Z89421 Acquired absence of other right toe(s): Secondary | ICD-10-CM | POA: Diagnosis not present

## 2018-05-16 DIAGNOSIS — E785 Hyperlipidemia, unspecified: Secondary | ICD-10-CM | POA: Diagnosis not present

## 2018-05-16 DIAGNOSIS — Z955 Presence of coronary angioplasty implant and graft: Secondary | ICD-10-CM | POA: Diagnosis not present

## 2018-05-16 DIAGNOSIS — I10 Essential (primary) hypertension: Secondary | ICD-10-CM | POA: Diagnosis not present

## 2018-05-16 DIAGNOSIS — I6529 Occlusion and stenosis of unspecified carotid artery: Secondary | ICD-10-CM | POA: Diagnosis not present

## 2018-05-16 DIAGNOSIS — E1142 Type 2 diabetes mellitus with diabetic polyneuropathy: Secondary | ICD-10-CM | POA: Diagnosis not present

## 2018-05-16 DIAGNOSIS — I252 Old myocardial infarction: Secondary | ICD-10-CM | POA: Diagnosis not present

## 2018-05-16 DIAGNOSIS — I70203 Unspecified atherosclerosis of native arteries of extremities, bilateral legs: Secondary | ICD-10-CM | POA: Diagnosis not present

## 2018-05-22 DIAGNOSIS — I739 Peripheral vascular disease, unspecified: Secondary | ICD-10-CM | POA: Diagnosis not present

## 2018-05-22 DIAGNOSIS — L97515 Non-pressure chronic ulcer of other part of right foot with muscle involvement without evidence of necrosis: Secondary | ICD-10-CM | POA: Diagnosis not present

## 2018-05-22 DIAGNOSIS — Z89421 Acquired absence of other right toe(s): Secondary | ICD-10-CM | POA: Diagnosis not present

## 2018-05-22 DIAGNOSIS — Z89612 Acquired absence of left leg above knee: Secondary | ICD-10-CM | POA: Diagnosis not present

## 2018-05-22 DIAGNOSIS — L97922 Non-pressure chronic ulcer of unspecified part of left lower leg with fat layer exposed: Secondary | ICD-10-CM | POA: Diagnosis not present

## 2018-05-22 DIAGNOSIS — Z89512 Acquired absence of left leg below knee: Secondary | ICD-10-CM | POA: Diagnosis not present

## 2018-05-22 DIAGNOSIS — L03119 Cellulitis of unspecified part of limb: Secondary | ICD-10-CM | POA: Diagnosis not present

## 2018-05-22 DIAGNOSIS — E11621 Type 2 diabetes mellitus with foot ulcer: Secondary | ICD-10-CM | POA: Diagnosis not present

## 2018-05-22 DIAGNOSIS — M47816 Spondylosis without myelopathy or radiculopathy, lumbar region: Secondary | ICD-10-CM | POA: Diagnosis not present

## 2018-05-22 DIAGNOSIS — Z7984 Long term (current) use of oral hypoglycemic drugs: Secondary | ICD-10-CM | POA: Diagnosis not present

## 2018-05-22 DIAGNOSIS — R5381 Other malaise: Secondary | ICD-10-CM | POA: Diagnosis not present

## 2018-05-22 DIAGNOSIS — E1142 Type 2 diabetes mellitus with diabetic polyneuropathy: Secondary | ICD-10-CM | POA: Diagnosis not present

## 2018-05-28 DIAGNOSIS — M47816 Spondylosis without myelopathy or radiculopathy, lumbar region: Secondary | ICD-10-CM | POA: Diagnosis not present

## 2018-05-28 DIAGNOSIS — L03119 Cellulitis of unspecified part of limb: Secondary | ICD-10-CM | POA: Diagnosis not present

## 2018-05-28 DIAGNOSIS — R5381 Other malaise: Secondary | ICD-10-CM | POA: Diagnosis not present

## 2018-05-28 DIAGNOSIS — L97922 Non-pressure chronic ulcer of unspecified part of left lower leg with fat layer exposed: Secondary | ICD-10-CM | POA: Diagnosis not present

## 2018-05-28 DIAGNOSIS — Z89512 Acquired absence of left leg below knee: Secondary | ICD-10-CM | POA: Diagnosis not present

## 2018-06-05 DIAGNOSIS — R2681 Unsteadiness on feet: Secondary | ICD-10-CM | POA: Diagnosis not present

## 2018-06-05 DIAGNOSIS — Z9181 History of falling: Secondary | ICD-10-CM | POA: Diagnosis not present

## 2018-06-05 DIAGNOSIS — R531 Weakness: Secondary | ICD-10-CM | POA: Diagnosis not present

## 2018-06-05 DIAGNOSIS — Z89421 Acquired absence of other right toe(s): Secondary | ICD-10-CM | POA: Diagnosis not present

## 2018-06-05 DIAGNOSIS — Z89612 Acquired absence of left leg above knee: Secondary | ICD-10-CM | POA: Diagnosis not present

## 2018-06-19 DIAGNOSIS — I739 Peripheral vascular disease, unspecified: Secondary | ICD-10-CM | POA: Diagnosis not present

## 2018-06-19 DIAGNOSIS — E1142 Type 2 diabetes mellitus with diabetic polyneuropathy: Secondary | ICD-10-CM | POA: Diagnosis not present

## 2018-06-19 DIAGNOSIS — E11621 Type 2 diabetes mellitus with foot ulcer: Secondary | ICD-10-CM | POA: Diagnosis not present

## 2018-06-19 DIAGNOSIS — Z89612 Acquired absence of left leg above knee: Secondary | ICD-10-CM | POA: Diagnosis not present

## 2018-06-19 DIAGNOSIS — L97515 Non-pressure chronic ulcer of other part of right foot with muscle involvement without evidence of necrosis: Secondary | ICD-10-CM | POA: Diagnosis not present

## 2018-06-19 DIAGNOSIS — M21611 Bunion of right foot: Secondary | ICD-10-CM | POA: Diagnosis not present

## 2018-06-22 DIAGNOSIS — L97922 Non-pressure chronic ulcer of unspecified part of left lower leg with fat layer exposed: Secondary | ICD-10-CM | POA: Diagnosis not present

## 2018-06-22 DIAGNOSIS — Z89512 Acquired absence of left leg below knee: Secondary | ICD-10-CM | POA: Diagnosis not present

## 2018-06-22 DIAGNOSIS — L03119 Cellulitis of unspecified part of limb: Secondary | ICD-10-CM | POA: Diagnosis not present

## 2018-06-22 DIAGNOSIS — R5381 Other malaise: Secondary | ICD-10-CM | POA: Diagnosis not present

## 2018-06-22 DIAGNOSIS — Z89612 Acquired absence of left leg above knee: Secondary | ICD-10-CM | POA: Diagnosis not present

## 2018-06-22 DIAGNOSIS — M47816 Spondylosis without myelopathy or radiculopathy, lumbar region: Secondary | ICD-10-CM | POA: Diagnosis not present

## 2018-06-25 ENCOUNTER — Other Ambulatory Visit: Payer: Self-pay | Admitting: *Deleted

## 2018-06-25 NOTE — Patient Outreach (Signed)
Pt called our nurse line today reporting severe L leg phantom pain. He has a LAKA He has taken APAP with no relief. He reports it hits him like an electrical shock and it just about knocks him out of his wheelchair. He has called Dr. Andria Rhein his prinary care MD who said he would not prescribe anything for pain without seeing him.  I have talked with William Buchanan and reviewed his med list. He is not on on gabapentin at present. It is not listed on his allergy list.  I have called Dr. Jeronimo Greaves, podiatrist, who is treating pt's right foot wound. I have left a message for him to request an approval to call in an RX of gabapentin for William Buchanan as this would be most appropriate for his described pain and his diagnosis on DM with PVD and now complaints of neuropathy and severe phantom pain.  The triage nurse took my message and will return my call.  Pt would like the medicine called into [facility] with a request for it to be delivered.  I will advise pt when I have received an answer.  [name]. [name], MSN, [facility] Gerontological Nurse Practitioner [facility] Care Management [phone]

## 2018-06-26 ENCOUNTER — Other Ambulatory Visit: Payer: Self-pay | Admitting: *Deleted

## 2018-06-26 NOTE — Patient Outreach (Signed)
Follow up on care coordination. Pt's podiatrist (Dr. Ronnald Ramp) office returned my call and reported that the provider does not prescribe gabapentin. They suggested that I call pt's primary care provider. I put in a call to Dr. Cora Daniels office and requested that he please consider giving his pt an Rx for gabapentin for his severe neuropathy and phatom leg pain. The triage nurse took the message and my number and said someone would return my call.  [name]. [name], MSN, [facility] Gerontological Nurse Practitioner [facility] Care Management [phone]

## 2018-06-27 ENCOUNTER — Other Ambulatory Visit: Payer: Self-pay | Admitting: *Deleted

## 2018-06-27 NOTE — Patient Outreach (Signed)
Follow up for pt with severe neuropathy pain and phantom pain of L leg. I have called Dr. Ronnald Ramp whose office reported that the medication I requested for the pt is not something that he prescribes. I have called pt's primary care MD and left a message. Having not received a call back, I called the office again today and was told Dr. Andria Rhein is not in the office now until next week.  I am going to open this well-known to me pt case again to accommodate him and ensure he is not suffering from unrelieved pain. I am ordering gabapentin 100 mg to be started on the following titration schedule:                  Days 1-3: 100 mg am, 100 mg pm, 300 mg hs ( if tolerating medication may increase as follows)                  Days 4-6 200 mg in am, 200 mg in pm and 300 mg hs                  Starting day 7 may take 300 mg tid  #63 for 9 day titration trial, no refills at this time. Pt to secure medication in his home.  I will see pt myself on Friday 06/19/18 for a face to face evaluation.  [name]. [name], MSN, [facility] Gerontological Nurse Practitioner [facility] Care Management [phone]

## 2018-06-29 ENCOUNTER — Other Ambulatory Visit: Payer: Self-pay | Admitting: *Deleted
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
## 2018-07-03 ENCOUNTER — Other Ambulatory Visit: Payer: Self-pay | Admitting: *Deleted

## 2018-07-03 NOTE — Patient Outreach (Signed)
Follow up phone call:  William Buchanan has a cold now. He thinks he may have gotten it from his daughter that has been sick for about a week. He is coughing, has a scratchy throat and generally feels awful. No fever.  He has his new Rx (gabapentin) but has not started it. He says he hasn't been hurting so much the last couple of days.  I have advised him that if he is not improving by Thursday, he should call Dr. Andria Rhein and report his sxs for advice.  I have encouraged him to start the gabapentin whenever he likes but he must take it as prescribed for the best benefit and not prn.  I will call him on Thursday to check on him.  William Buchanan. [name], MSN, [facility] Gerontological Nurse Practitioner [facility] Care Management [phone]

## 2018-07-04 DIAGNOSIS — J0100 Acute maxillary sinusitis, unspecified: Secondary | ICD-10-CM | POA: Diagnosis not present

## 2018-07-05 ENCOUNTER — Other Ambulatory Visit: Payer: Self-pay | Admitting: *Deleted

## 2018-07-05 NOTE — Patient Outreach (Signed)
Follow up call to re-evaluate pt respiratory status. Pt reports he went to his primary office for evaluation yesterday and was diagnosed with sinusitis. He was given an antibiotic and cough med. Today he still feels terrible. Doesn't feel like eating, everything tastes funny. He acknowledges he doesn't have too much support. He is fairly independent however. He can get in and out of bed to w/c.  He can prepare his meals. He is managing his medications, checking his glucose levels. He has difficulty with bathing. He does not drive.   His home which is in the basement of his home, grand daughter lives on main floor. His space is very cluttered and could use a good cleaning. I have not referred him for a social work referral in the past because he says he doesn't have funds for that kind of help.  PLAN: I am going to call him again on Monday to follow up.            I will ask him if I may made the social work referral.  Kayleen Memos C. [name], MSN, [facility] Gerontological Nurse Practitioner [facility] Care Management [phone]

## 2018-07-09 ENCOUNTER — Other Ambulatory Visit: Payer: Self-pay | Admitting: *Deleted

## 2018-07-09 NOTE — Patient Outreach (Signed)
Follow up phone call to assess pt acute URI.   S:  William Buchanan has completed his antibiotic and is feeling much better. He has missed two PT appts and these will hopefully be rescheduled. He does not complain of any neuropathy pain today.  A: Resolving URI     Resolving R foot wound (under the care of a podiatrist)       DM with peripheral neuropathy   P: I am available to William Buchanan on an as needed basis, he knows he can call me with problems that need to be addressed.  [name]. [name], MSN, [facility] Gerontological Nurse Practitioner [facility] Care Management [phone]

## 2018-07-17 DIAGNOSIS — I739 Peripheral vascular disease, unspecified: Secondary | ICD-10-CM | POA: Diagnosis not present

## 2018-07-17 DIAGNOSIS — S98131A Complete traumatic amputation of one right lesser toe, initial encounter: Secondary | ICD-10-CM | POA: Diagnosis not present

## 2018-07-17 DIAGNOSIS — S78112A Complete traumatic amputation at level between left hip and knee, initial encounter: Secondary | ICD-10-CM | POA: Diagnosis not present

## 2018-07-17 DIAGNOSIS — E1142 Type 2 diabetes mellitus with diabetic polyneuropathy: Secondary | ICD-10-CM | POA: Diagnosis not present

## 2018-07-22 DIAGNOSIS — L97922 Non-pressure chronic ulcer of unspecified part of left lower leg with fat layer exposed: Secondary | ICD-10-CM | POA: Diagnosis not present

## 2018-07-22 DIAGNOSIS — Z89512 Acquired absence of left leg below knee: Secondary | ICD-10-CM | POA: Diagnosis not present

## 2018-07-22 DIAGNOSIS — M47816 Spondylosis without myelopathy or radiculopathy, lumbar region: Secondary | ICD-10-CM | POA: Diagnosis not present

## 2018-07-22 DIAGNOSIS — L03119 Cellulitis of unspecified part of limb: Secondary | ICD-10-CM | POA: Diagnosis not present

## 2018-07-22 DIAGNOSIS — R5381 Other malaise: Secondary | ICD-10-CM | POA: Diagnosis not present

## 2018-08-17 DIAGNOSIS — I70203 Unspecified atherosclerosis of native arteries of extremities, bilateral legs: Secondary | ICD-10-CM | POA: Diagnosis not present

## 2018-08-17 DIAGNOSIS — I739 Peripheral vascular disease, unspecified: Secondary | ICD-10-CM | POA: Diagnosis not present

## 2018-08-17 DIAGNOSIS — I6523 Occlusion and stenosis of bilateral carotid arteries: Secondary | ICD-10-CM | POA: Diagnosis not present

## 2018-08-17 DIAGNOSIS — Z95828 Presence of other vascular implants and grafts: Secondary | ICD-10-CM | POA: Diagnosis not present

## 2018-08-22 DIAGNOSIS — R5381 Other malaise: Secondary | ICD-10-CM | POA: Diagnosis not present

## 2018-08-22 DIAGNOSIS — L97922 Non-pressure chronic ulcer of unspecified part of left lower leg with fat layer exposed: Secondary | ICD-10-CM | POA: Diagnosis not present

## 2018-08-22 DIAGNOSIS — L03119 Cellulitis of unspecified part of limb: Secondary | ICD-10-CM | POA: Diagnosis not present

## 2018-08-22 DIAGNOSIS — Z89512 Acquired absence of left leg below knee: Secondary | ICD-10-CM | POA: Diagnosis not present

## 2018-08-22 DIAGNOSIS — M47816 Spondylosis without myelopathy or radiculopathy, lumbar region: Secondary | ICD-10-CM | POA: Diagnosis not present

## 2018-08-24 DIAGNOSIS — E7800 Pure hypercholesterolemia, unspecified: Secondary | ICD-10-CM | POA: Diagnosis not present

## 2018-08-24 DIAGNOSIS — Z0000 Encounter for general adult medical examination without abnormal findings: Secondary | ICD-10-CM | POA: Diagnosis not present

## 2018-08-24 DIAGNOSIS — E1140 Type 2 diabetes mellitus with diabetic neuropathy, unspecified: Secondary | ICD-10-CM | POA: Diagnosis not present

## 2018-08-24 DIAGNOSIS — E785 Hyperlipidemia, unspecified: Secondary | ICD-10-CM | POA: Diagnosis not present

## 2018-08-24 DIAGNOSIS — I10 Essential (primary) hypertension: Secondary | ICD-10-CM | POA: Diagnosis not present

## 2018-08-27 DIAGNOSIS — I10 Essential (primary) hypertension: Secondary | ICD-10-CM | POA: Diagnosis not present

## 2018-08-27 DIAGNOSIS — E7800 Pure hypercholesterolemia, unspecified: Secondary | ICD-10-CM | POA: Diagnosis not present

## 2018-08-27 DIAGNOSIS — L308 Other specified dermatitis: Secondary | ICD-10-CM | POA: Diagnosis not present

## 2018-08-27 DIAGNOSIS — E1142 Type 2 diabetes mellitus with diabetic polyneuropathy: Secondary | ICD-10-CM | POA: Diagnosis not present

## 2018-09-22 DIAGNOSIS — Z89512 Acquired absence of left leg below knee: Secondary | ICD-10-CM | POA: Diagnosis not present

## 2018-09-22 DIAGNOSIS — M47816 Spondylosis without myelopathy or radiculopathy, lumbar region: Secondary | ICD-10-CM | POA: Diagnosis not present

## 2018-09-22 DIAGNOSIS — R5381 Other malaise: Secondary | ICD-10-CM | POA: Diagnosis not present

## 2018-09-22 DIAGNOSIS — L97922 Non-pressure chronic ulcer of unspecified part of left lower leg with fat layer exposed: Secondary | ICD-10-CM | POA: Diagnosis not present

## 2018-09-22 DIAGNOSIS — L03119 Cellulitis of unspecified part of limb: Secondary | ICD-10-CM | POA: Diagnosis not present

## 2018-11-05 DIAGNOSIS — Z89612 Acquired absence of left leg above knee: Secondary | ICD-10-CM | POA: Diagnosis not present

## 2018-12-11 ENCOUNTER — Other Ambulatory Visit: Payer: Self-pay | Admitting: *Deleted

## 2018-12-21 DIAGNOSIS — Z89512 Acquired absence of left leg below knee: Secondary | ICD-10-CM | POA: Diagnosis not present

## 2018-12-21 DIAGNOSIS — L97511 Non-pressure chronic ulcer of other part of right foot limited to breakdown of skin: Secondary | ICD-10-CM | POA: Diagnosis not present

## 2018-12-21 DIAGNOSIS — M21611 Bunion of right foot: Secondary | ICD-10-CM | POA: Diagnosis not present

## 2018-12-21 DIAGNOSIS — E11621 Type 2 diabetes mellitus with foot ulcer: Secondary | ICD-10-CM | POA: Diagnosis not present

## 2018-12-21 DIAGNOSIS — L97519 Non-pressure chronic ulcer of other part of right foot with unspecified severity: Secondary | ICD-10-CM | POA: Diagnosis not present

## 2019-02-07 DIAGNOSIS — Z89612 Acquired absence of left leg above knee: Secondary | ICD-10-CM | POA: Diagnosis not present

## 2019-02-07 DIAGNOSIS — I6523 Occlusion and stenosis of bilateral carotid arteries: Secondary | ICD-10-CM | POA: Diagnosis not present

## 2019-02-07 DIAGNOSIS — Z95820 Peripheral vascular angioplasty status with implants and grafts: Secondary | ICD-10-CM | POA: Diagnosis not present

## 2019-02-07 DIAGNOSIS — I739 Peripheral vascular disease, unspecified: Secondary | ICD-10-CM | POA: Diagnosis not present

## 2019-02-14 DIAGNOSIS — Z9181 History of falling: Secondary | ICD-10-CM | POA: Diagnosis not present

## 2019-02-14 DIAGNOSIS — R2681 Unsteadiness on feet: Secondary | ICD-10-CM | POA: Diagnosis not present

## 2019-02-14 DIAGNOSIS — I739 Peripheral vascular disease, unspecified: Secondary | ICD-10-CM | POA: Diagnosis not present

## 2019-02-14 DIAGNOSIS — R531 Weakness: Secondary | ICD-10-CM | POA: Diagnosis not present

## 2019-02-14 DIAGNOSIS — Z89612 Acquired absence of left leg above knee: Secondary | ICD-10-CM | POA: Diagnosis not present

## 2019-02-25 DIAGNOSIS — E1142 Type 2 diabetes mellitus with diabetic polyneuropathy: Secondary | ICD-10-CM | POA: Diagnosis not present

## 2019-02-25 DIAGNOSIS — L308 Other specified dermatitis: Secondary | ICD-10-CM | POA: Diagnosis not present

## 2019-03-05 DIAGNOSIS — R2689 Other abnormalities of gait and mobility: Secondary | ICD-10-CM | POA: Diagnosis not present

## 2019-03-05 DIAGNOSIS — Z89612 Acquired absence of left leg above knee: Secondary | ICD-10-CM | POA: Diagnosis not present

## 2019-03-05 DIAGNOSIS — Z9181 History of falling: Secondary | ICD-10-CM | POA: Diagnosis not present

## 2019-03-05 DIAGNOSIS — S91101D Unspecified open wound of right great toe without damage to nail, subsequent encounter: Secondary | ICD-10-CM | POA: Diagnosis not present

## 2019-03-05 DIAGNOSIS — R531 Weakness: Secondary | ICD-10-CM | POA: Diagnosis not present

## 2019-03-07 DIAGNOSIS — E1142 Type 2 diabetes mellitus with diabetic polyneuropathy: Secondary | ICD-10-CM | POA: Diagnosis not present

## 2019-03-07 DIAGNOSIS — Z89421 Acquired absence of other right toe(s): Secondary | ICD-10-CM | POA: Diagnosis not present

## 2019-03-07 DIAGNOSIS — I739 Peripheral vascular disease, unspecified: Secondary | ICD-10-CM | POA: Diagnosis not present

## 2019-03-07 DIAGNOSIS — S78112A Complete traumatic amputation at level between left hip and knee, initial encounter: Secondary | ICD-10-CM | POA: Diagnosis not present

## 2019-03-07 DIAGNOSIS — S90411A Abrasion, right great toe, initial encounter: Secondary | ICD-10-CM | POA: Diagnosis not present

## 2019-03-15 DIAGNOSIS — I739 Peripheral vascular disease, unspecified: Secondary | ICD-10-CM | POA: Diagnosis not present

## 2019-03-15 DIAGNOSIS — Z89421 Acquired absence of other right toe(s): Secondary | ICD-10-CM | POA: Diagnosis not present

## 2019-03-15 DIAGNOSIS — S91101D Unspecified open wound of right great toe without damage to nail, subsequent encounter: Secondary | ICD-10-CM | POA: Diagnosis not present

## 2019-03-15 DIAGNOSIS — E1142 Type 2 diabetes mellitus with diabetic polyneuropathy: Secondary | ICD-10-CM | POA: Diagnosis not present

## 2019-03-15 DIAGNOSIS — S78112A Complete traumatic amputation at level between left hip and knee, initial encounter: Secondary | ICD-10-CM | POA: Diagnosis not present

## 2019-03-26 DIAGNOSIS — I70203 Unspecified atherosclerosis of native arteries of extremities, bilateral legs: Secondary | ICD-10-CM | POA: Diagnosis not present

## 2019-03-26 DIAGNOSIS — S91101D Unspecified open wound of right great toe without damage to nail, subsequent encounter: Secondary | ICD-10-CM | POA: Diagnosis not present

## 2019-04-02 DIAGNOSIS — Z9181 History of falling: Secondary | ICD-10-CM | POA: Diagnosis not present

## 2019-04-02 DIAGNOSIS — R531 Weakness: Secondary | ICD-10-CM | POA: Diagnosis not present

## 2019-04-02 DIAGNOSIS — Z89612 Acquired absence of left leg above knee: Secondary | ICD-10-CM | POA: Diagnosis not present

## 2019-04-02 DIAGNOSIS — R2681 Unsteadiness on feet: Secondary | ICD-10-CM | POA: Diagnosis not present

## 2019-04-25 DIAGNOSIS — N3000 Acute cystitis without hematuria: Secondary | ICD-10-CM | POA: Diagnosis not present

## 2019-05-01 DIAGNOSIS — L309 Dermatitis, unspecified: Secondary | ICD-10-CM | POA: Diagnosis not present

## 2019-05-07 DIAGNOSIS — Z9181 History of falling: Secondary | ICD-10-CM | POA: Diagnosis not present

## 2019-05-07 DIAGNOSIS — M6281 Muscle weakness (generalized): Secondary | ICD-10-CM | POA: Diagnosis not present

## 2019-05-07 DIAGNOSIS — R2681 Unsteadiness on feet: Secondary | ICD-10-CM | POA: Diagnosis not present

## 2019-05-07 DIAGNOSIS — Z89612 Acquired absence of left leg above knee: Secondary | ICD-10-CM | POA: Diagnosis not present

## 2019-05-28 DIAGNOSIS — D485 Neoplasm of uncertain behavior of skin: Secondary | ICD-10-CM | POA: Diagnosis not present

## 2019-05-28 DIAGNOSIS — L570 Actinic keratosis: Secondary | ICD-10-CM | POA: Diagnosis not present

## 2019-05-28 DIAGNOSIS — L820 Inflamed seborrheic keratosis: Secondary | ICD-10-CM | POA: Diagnosis not present

## 2019-05-28 DIAGNOSIS — L508 Other urticaria: Secondary | ICD-10-CM | POA: Diagnosis not present

## 2019-06-16 DIAGNOSIS — I739 Peripheral vascular disease, unspecified: Secondary | ICD-10-CM | POA: Diagnosis not present

## 2019-06-16 DIAGNOSIS — S98131A Complete traumatic amputation of one right lesser toe, initial encounter: Secondary | ICD-10-CM | POA: Diagnosis not present

## 2019-06-16 DIAGNOSIS — L97911 Non-pressure chronic ulcer of unspecified part of right lower leg limited to breakdown of skin: Secondary | ICD-10-CM | POA: Diagnosis not present

## 2019-06-16 DIAGNOSIS — S78112D Complete traumatic amputation at level between left hip and knee, subsequent encounter: Secondary | ICD-10-CM | POA: Diagnosis not present

## 2019-06-16 DIAGNOSIS — S78112A Complete traumatic amputation at level between left hip and knee, initial encounter: Secondary | ICD-10-CM | POA: Diagnosis not present

## 2019-06-16 DIAGNOSIS — E1142 Type 2 diabetes mellitus with diabetic polyneuropathy: Secondary | ICD-10-CM | POA: Diagnosis not present

## 2019-06-16 DIAGNOSIS — S98131D Complete traumatic amputation of one right lesser toe, subsequent encounter: Secondary | ICD-10-CM | POA: Diagnosis not present

## 2019-06-16 DIAGNOSIS — Z7984 Long term (current) use of oral hypoglycemic drugs: Secondary | ICD-10-CM | POA: Diagnosis not present

## 2019-06-20 DIAGNOSIS — Z881 Allergy status to other antibiotic agents status: Secondary | ICD-10-CM | POA: Diagnosis not present

## 2019-06-20 DIAGNOSIS — Z880 Allergy status to penicillin: Secondary | ICD-10-CM | POA: Diagnosis not present

## 2019-06-20 DIAGNOSIS — I70213 Atherosclerosis of native arteries of extremities with intermittent claudication, bilateral legs: Secondary | ICD-10-CM | POA: Diagnosis not present

## 2019-06-20 DIAGNOSIS — Z87891 Personal history of nicotine dependence: Secondary | ICD-10-CM | POA: Diagnosis not present

## 2019-06-20 DIAGNOSIS — I10 Essential (primary) hypertension: Secondary | ICD-10-CM | POA: Diagnosis not present

## 2019-06-20 DIAGNOSIS — I25119 Atherosclerotic heart disease of native coronary artery with unspecified angina pectoris: Secondary | ICD-10-CM | POA: Diagnosis not present

## 2019-06-20 DIAGNOSIS — Z884 Allergy status to anesthetic agent status: Secondary | ICD-10-CM | POA: Diagnosis not present

## 2019-06-20 DIAGNOSIS — L97812 Non-pressure chronic ulcer of other part of right lower leg with fat layer exposed: Secondary | ICD-10-CM | POA: Diagnosis not present

## 2019-06-20 DIAGNOSIS — J449 Chronic obstructive pulmonary disease, unspecified: Secondary | ICD-10-CM | POA: Diagnosis not present

## 2019-06-20 DIAGNOSIS — I83018 Varicose veins of right lower extremity with ulcer other part of lower leg: Secondary | ICD-10-CM | POA: Diagnosis not present

## 2019-06-24 DIAGNOSIS — I872 Venous insufficiency (chronic) (peripheral): Secondary | ICD-10-CM | POA: Diagnosis not present

## 2019-06-24 DIAGNOSIS — I83018 Varicose veins of right lower extremity with ulcer other part of lower leg: Secondary | ICD-10-CM | POA: Diagnosis not present

## 2019-06-24 DIAGNOSIS — J449 Chronic obstructive pulmonary disease, unspecified: Secondary | ICD-10-CM | POA: Diagnosis not present

## 2019-06-24 DIAGNOSIS — I70213 Atherosclerosis of native arteries of extremities with intermittent claudication, bilateral legs: Secondary | ICD-10-CM | POA: Diagnosis not present

## 2019-06-24 DIAGNOSIS — I25119 Atherosclerotic heart disease of native coronary artery with unspecified angina pectoris: Secondary | ICD-10-CM | POA: Diagnosis not present

## 2019-06-24 DIAGNOSIS — L97812 Non-pressure chronic ulcer of other part of right lower leg with fat layer exposed: Secondary | ICD-10-CM | POA: Diagnosis not present

## 2019-07-01 DIAGNOSIS — I70213 Atherosclerosis of native arteries of extremities with intermittent claudication, bilateral legs: Secondary | ICD-10-CM | POA: Diagnosis not present

## 2019-07-01 DIAGNOSIS — I25119 Atherosclerotic heart disease of native coronary artery with unspecified angina pectoris: Secondary | ICD-10-CM | POA: Diagnosis not present

## 2019-07-01 DIAGNOSIS — L97812 Non-pressure chronic ulcer of other part of right lower leg with fat layer exposed: Secondary | ICD-10-CM | POA: Diagnosis not present

## 2019-07-01 DIAGNOSIS — J449 Chronic obstructive pulmonary disease, unspecified: Secondary | ICD-10-CM | POA: Diagnosis not present

## 2019-07-01 DIAGNOSIS — I83018 Varicose veins of right lower extremity with ulcer other part of lower leg: Secondary | ICD-10-CM | POA: Diagnosis not present

## 2019-07-08 DIAGNOSIS — J449 Chronic obstructive pulmonary disease, unspecified: Secondary | ICD-10-CM | POA: Diagnosis not present

## 2019-07-08 DIAGNOSIS — L97812 Non-pressure chronic ulcer of other part of right lower leg with fat layer exposed: Secondary | ICD-10-CM | POA: Diagnosis not present

## 2019-07-08 DIAGNOSIS — I70213 Atherosclerosis of native arteries of extremities with intermittent claudication, bilateral legs: Secondary | ICD-10-CM | POA: Diagnosis not present

## 2019-07-08 DIAGNOSIS — I25119 Atherosclerotic heart disease of native coronary artery with unspecified angina pectoris: Secondary | ICD-10-CM | POA: Diagnosis not present

## 2019-07-08 DIAGNOSIS — I83018 Varicose veins of right lower extremity with ulcer other part of lower leg: Secondary | ICD-10-CM | POA: Diagnosis not present

## 2019-07-15 DIAGNOSIS — I25119 Atherosclerotic heart disease of native coronary artery with unspecified angina pectoris: Secondary | ICD-10-CM | POA: Diagnosis not present

## 2019-07-15 DIAGNOSIS — J449 Chronic obstructive pulmonary disease, unspecified: Secondary | ICD-10-CM | POA: Diagnosis not present

## 2019-07-15 DIAGNOSIS — I83018 Varicose veins of right lower extremity with ulcer other part of lower leg: Secondary | ICD-10-CM | POA: Diagnosis not present

## 2019-07-15 DIAGNOSIS — L97812 Non-pressure chronic ulcer of other part of right lower leg with fat layer exposed: Secondary | ICD-10-CM | POA: Diagnosis not present

## 2019-07-15 DIAGNOSIS — I70213 Atherosclerosis of native arteries of extremities with intermittent claudication, bilateral legs: Secondary | ICD-10-CM | POA: Diagnosis not present

## 2019-07-19 DIAGNOSIS — I25119 Atherosclerotic heart disease of native coronary artery with unspecified angina pectoris: Secondary | ICD-10-CM | POA: Diagnosis not present

## 2019-07-19 DIAGNOSIS — L97812 Non-pressure chronic ulcer of other part of right lower leg with fat layer exposed: Secondary | ICD-10-CM | POA: Diagnosis not present

## 2019-07-19 DIAGNOSIS — I70213 Atherosclerosis of native arteries of extremities with intermittent claudication, bilateral legs: Secondary | ICD-10-CM | POA: Diagnosis not present

## 2019-07-19 DIAGNOSIS — J449 Chronic obstructive pulmonary disease, unspecified: Secondary | ICD-10-CM | POA: Diagnosis not present

## 2019-07-19 DIAGNOSIS — I83018 Varicose veins of right lower extremity with ulcer other part of lower leg: Secondary | ICD-10-CM | POA: Diagnosis not present

## 2019-07-23 DIAGNOSIS — I872 Venous insufficiency (chronic) (peripheral): Secondary | ICD-10-CM | POA: Diagnosis not present

## 2019-07-23 DIAGNOSIS — L97812 Non-pressure chronic ulcer of other part of right lower leg with fat layer exposed: Secondary | ICD-10-CM | POA: Diagnosis not present

## 2019-07-23 DIAGNOSIS — Z5320 Procedure and treatment not carried out because of patient's decision for unspecified reasons: Secondary | ICD-10-CM | POA: Diagnosis not present

## 2019-07-23 DIAGNOSIS — I25119 Atherosclerotic heart disease of native coronary artery with unspecified angina pectoris: Secondary | ICD-10-CM | POA: Diagnosis not present

## 2019-07-23 DIAGNOSIS — R4701 Aphasia: Secondary | ICD-10-CM | POA: Diagnosis not present

## 2019-07-23 DIAGNOSIS — Z8639 Personal history of other endocrine, nutritional and metabolic disease: Secondary | ICD-10-CM | POA: Diagnosis not present

## 2019-07-23 DIAGNOSIS — Z87891 Personal history of nicotine dependence: Secondary | ICD-10-CM | POA: Diagnosis not present

## 2019-07-23 DIAGNOSIS — Z7901 Long term (current) use of anticoagulants: Secondary | ICD-10-CM | POA: Diagnosis not present

## 2019-07-23 DIAGNOSIS — I83018 Varicose veins of right lower extremity with ulcer other part of lower leg: Secondary | ICD-10-CM | POA: Diagnosis not present

## 2019-07-23 DIAGNOSIS — I70213 Atherosclerosis of native arteries of extremities with intermittent claudication, bilateral legs: Secondary | ICD-10-CM | POA: Diagnosis not present

## 2019-07-23 DIAGNOSIS — Z89512 Acquired absence of left leg below knee: Secondary | ICD-10-CM | POA: Diagnosis not present

## 2019-07-23 DIAGNOSIS — Z8679 Personal history of other diseases of the circulatory system: Secondary | ICD-10-CM | POA: Diagnosis not present

## 2019-07-23 DIAGNOSIS — Z89421 Acquired absence of other right toe(s): Secondary | ICD-10-CM | POA: Diagnosis not present

## 2019-07-23 DIAGNOSIS — J449 Chronic obstructive pulmonary disease, unspecified: Secondary | ICD-10-CM | POA: Diagnosis not present

## 2019-07-30 DIAGNOSIS — E11621 Type 2 diabetes mellitus with foot ulcer: Secondary | ICD-10-CM | POA: Diagnosis not present

## 2019-07-30 DIAGNOSIS — L97812 Non-pressure chronic ulcer of other part of right lower leg with fat layer exposed: Secondary | ICD-10-CM | POA: Diagnosis not present

## 2019-07-30 DIAGNOSIS — Z87891 Personal history of nicotine dependence: Secondary | ICD-10-CM | POA: Diagnosis not present

## 2019-07-30 DIAGNOSIS — Z79899 Other long term (current) drug therapy: Secondary | ICD-10-CM | POA: Diagnosis not present

## 2019-07-30 DIAGNOSIS — Z89612 Acquired absence of left leg above knee: Secondary | ICD-10-CM | POA: Diagnosis not present

## 2019-07-30 DIAGNOSIS — Z7982 Long term (current) use of aspirin: Secondary | ICD-10-CM | POA: Diagnosis not present

## 2019-07-30 DIAGNOSIS — I25119 Atherosclerotic heart disease of native coronary artery with unspecified angina pectoris: Secondary | ICD-10-CM | POA: Diagnosis not present

## 2019-07-30 DIAGNOSIS — E1140 Type 2 diabetes mellitus with diabetic neuropathy, unspecified: Secondary | ICD-10-CM | POA: Diagnosis not present

## 2019-07-30 DIAGNOSIS — I83018 Varicose veins of right lower extremity with ulcer other part of lower leg: Secondary | ICD-10-CM | POA: Diagnosis not present

## 2019-07-30 DIAGNOSIS — J449 Chronic obstructive pulmonary disease, unspecified: Secondary | ICD-10-CM | POA: Diagnosis not present

## 2019-07-30 DIAGNOSIS — Z7902 Long term (current) use of antithrombotics/antiplatelets: Secondary | ICD-10-CM | POA: Diagnosis not present

## 2019-07-30 DIAGNOSIS — Z7951 Long term (current) use of inhaled steroids: Secondary | ICD-10-CM | POA: Diagnosis not present

## 2019-07-30 DIAGNOSIS — L97511 Non-pressure chronic ulcer of other part of right foot limited to breakdown of skin: Secondary | ICD-10-CM | POA: Diagnosis not present

## 2019-07-30 DIAGNOSIS — I70213 Atherosclerosis of native arteries of extremities with intermittent claudication, bilateral legs: Secondary | ICD-10-CM | POA: Diagnosis not present

## 2019-07-30 DIAGNOSIS — Z7984 Long term (current) use of oral hypoglycemic drugs: Secondary | ICD-10-CM | POA: Diagnosis not present

## 2019-07-31 DIAGNOSIS — S91302A Unspecified open wound, left foot, initial encounter: Secondary | ICD-10-CM | POA: Diagnosis not present

## 2019-08-06 DIAGNOSIS — I83018 Varicose veins of right lower extremity with ulcer other part of lower leg: Secondary | ICD-10-CM | POA: Diagnosis not present

## 2019-08-06 DIAGNOSIS — E1142 Type 2 diabetes mellitus with diabetic polyneuropathy: Secondary | ICD-10-CM | POA: Diagnosis not present

## 2019-08-06 DIAGNOSIS — I70213 Atherosclerosis of native arteries of extremities with intermittent claudication, bilateral legs: Secondary | ICD-10-CM | POA: Diagnosis not present

## 2019-08-06 DIAGNOSIS — E11622 Type 2 diabetes mellitus with other skin ulcer: Secondary | ICD-10-CM | POA: Diagnosis not present

## 2019-08-06 DIAGNOSIS — L97909 Non-pressure chronic ulcer of unspecified part of unspecified lower leg with unspecified severity: Secondary | ICD-10-CM | POA: Diagnosis not present

## 2019-08-06 DIAGNOSIS — L97812 Non-pressure chronic ulcer of other part of right lower leg with fat layer exposed: Secondary | ICD-10-CM | POA: Diagnosis not present

## 2019-08-15 DIAGNOSIS — L97812 Non-pressure chronic ulcer of other part of right lower leg with fat layer exposed: Secondary | ICD-10-CM | POA: Diagnosis not present

## 2019-08-15 DIAGNOSIS — I83018 Varicose veins of right lower extremity with ulcer other part of lower leg: Secondary | ICD-10-CM | POA: Diagnosis not present

## 2019-08-20 DIAGNOSIS — L97909 Non-pressure chronic ulcer of unspecified part of unspecified lower leg with unspecified severity: Secondary | ICD-10-CM | POA: Diagnosis not present

## 2019-08-20 DIAGNOSIS — L97812 Non-pressure chronic ulcer of other part of right lower leg with fat layer exposed: Secondary | ICD-10-CM | POA: Diagnosis not present

## 2019-08-20 DIAGNOSIS — I70213 Atherosclerosis of native arteries of extremities with intermittent claudication, bilateral legs: Secondary | ICD-10-CM | POA: Diagnosis not present

## 2019-08-20 DIAGNOSIS — E11622 Type 2 diabetes mellitus with other skin ulcer: Secondary | ICD-10-CM | POA: Diagnosis not present

## 2019-08-20 DIAGNOSIS — I25119 Atherosclerotic heart disease of native coronary artery with unspecified angina pectoris: Secondary | ICD-10-CM | POA: Diagnosis not present

## 2019-08-20 DIAGNOSIS — I83018 Varicose veins of right lower extremity with ulcer other part of lower leg: Secondary | ICD-10-CM | POA: Diagnosis not present

## 2019-08-29 DIAGNOSIS — Z89421 Acquired absence of other right toe(s): Secondary | ICD-10-CM | POA: Diagnosis not present

## 2019-08-29 DIAGNOSIS — E1142 Type 2 diabetes mellitus with diabetic polyneuropathy: Secondary | ICD-10-CM | POA: Diagnosis not present

## 2019-08-29 DIAGNOSIS — E7800 Pure hypercholesterolemia, unspecified: Secondary | ICD-10-CM | POA: Diagnosis not present

## 2019-08-29 DIAGNOSIS — Z89512 Acquired absence of left leg below knee: Secondary | ICD-10-CM | POA: Diagnosis not present

## 2019-08-29 DIAGNOSIS — N400 Enlarged prostate without lower urinary tract symptoms: Secondary | ICD-10-CM | POA: Diagnosis not present

## 2019-08-29 DIAGNOSIS — I10 Essential (primary) hypertension: Secondary | ICD-10-CM | POA: Diagnosis not present

## 2019-08-29 DIAGNOSIS — R300 Dysuria: Secondary | ICD-10-CM | POA: Diagnosis not present

## 2019-08-29 DIAGNOSIS — Z0000 Encounter for general adult medical examination without abnormal findings: Secondary | ICD-10-CM | POA: Diagnosis not present

## 2019-09-03 DIAGNOSIS — E11622 Type 2 diabetes mellitus with other skin ulcer: Secondary | ICD-10-CM | POA: Diagnosis not present

## 2019-09-03 DIAGNOSIS — E1151 Type 2 diabetes mellitus with diabetic peripheral angiopathy without gangrene: Secondary | ICD-10-CM | POA: Diagnosis not present

## 2019-09-03 DIAGNOSIS — E1142 Type 2 diabetes mellitus with diabetic polyneuropathy: Secondary | ICD-10-CM | POA: Diagnosis not present

## 2019-09-03 DIAGNOSIS — L97812 Non-pressure chronic ulcer of other part of right lower leg with fat layer exposed: Secondary | ICD-10-CM | POA: Diagnosis not present

## 2019-09-03 DIAGNOSIS — I70213 Atherosclerosis of native arteries of extremities with intermittent claudication, bilateral legs: Secondary | ICD-10-CM | POA: Diagnosis not present

## 2019-09-03 DIAGNOSIS — D649 Anemia, unspecified: Secondary | ICD-10-CM | POA: Diagnosis not present

## 2019-09-03 DIAGNOSIS — E11621 Type 2 diabetes mellitus with foot ulcer: Secondary | ICD-10-CM | POA: Diagnosis not present

## 2019-09-17 DIAGNOSIS — E1151 Type 2 diabetes mellitus with diabetic peripheral angiopathy without gangrene: Secondary | ICD-10-CM | POA: Diagnosis not present

## 2019-09-17 DIAGNOSIS — I70213 Atherosclerosis of native arteries of extremities with intermittent claudication, bilateral legs: Secondary | ICD-10-CM | POA: Diagnosis not present

## 2019-09-17 DIAGNOSIS — E11622 Type 2 diabetes mellitus with other skin ulcer: Secondary | ICD-10-CM | POA: Diagnosis not present

## 2019-09-17 DIAGNOSIS — Z87891 Personal history of nicotine dependence: Secondary | ICD-10-CM | POA: Diagnosis not present

## 2019-09-17 DIAGNOSIS — L97812 Non-pressure chronic ulcer of other part of right lower leg with fat layer exposed: Secondary | ICD-10-CM | POA: Diagnosis not present

## 2019-09-17 DIAGNOSIS — J449 Chronic obstructive pulmonary disease, unspecified: Secondary | ICD-10-CM | POA: Diagnosis not present

## 2019-09-17 DIAGNOSIS — I872 Venous insufficiency (chronic) (peripheral): Secondary | ICD-10-CM | POA: Diagnosis not present

## 2019-09-17 DIAGNOSIS — I83018 Varicose veins of right lower extremity with ulcer other part of lower leg: Secondary | ICD-10-CM | POA: Diagnosis not present

## 2019-10-11 DIAGNOSIS — S78112A Complete traumatic amputation at level between left hip and knee, initial encounter: Secondary | ICD-10-CM | POA: Diagnosis not present

## 2019-10-11 DIAGNOSIS — G546 Phantom limb syndrome with pain: Secondary | ICD-10-CM | POA: Diagnosis not present

## 2019-10-11 DIAGNOSIS — E559 Vitamin D deficiency, unspecified: Secondary | ICD-10-CM | POA: Diagnosis not present

## 2019-10-12 DIAGNOSIS — N400 Enlarged prostate without lower urinary tract symptoms: Secondary | ICD-10-CM | POA: Diagnosis not present

## 2019-10-12 DIAGNOSIS — E785 Hyperlipidemia, unspecified: Secondary | ICD-10-CM | POA: Diagnosis not present

## 2019-10-12 DIAGNOSIS — S78111D Complete traumatic amputation at level between right hip and knee, subsequent encounter: Secondary | ICD-10-CM | POA: Diagnosis not present

## 2019-10-12 DIAGNOSIS — E7800 Pure hypercholesterolemia, unspecified: Secondary | ICD-10-CM | POA: Diagnosis not present

## 2019-10-12 DIAGNOSIS — Z7982 Long term (current) use of aspirin: Secondary | ICD-10-CM | POA: Diagnosis not present

## 2019-10-12 DIAGNOSIS — Z87891 Personal history of nicotine dependence: Secondary | ICD-10-CM | POA: Diagnosis not present

## 2019-10-12 DIAGNOSIS — Z89611 Acquired absence of right leg above knee: Secondary | ICD-10-CM | POA: Diagnosis not present

## 2019-10-12 DIAGNOSIS — Z955 Presence of coronary angioplasty implant and graft: Secondary | ICD-10-CM | POA: Diagnosis not present

## 2019-10-12 DIAGNOSIS — H35443 Age-related reticular degeneration of retina, bilateral: Secondary | ICD-10-CM | POA: Diagnosis not present

## 2019-10-12 DIAGNOSIS — I252 Old myocardial infarction: Secondary | ICD-10-CM | POA: Diagnosis not present

## 2019-10-12 DIAGNOSIS — Z7984 Long term (current) use of oral hypoglycemic drugs: Secondary | ICD-10-CM | POA: Diagnosis not present

## 2019-10-12 DIAGNOSIS — I2510 Atherosclerotic heart disease of native coronary artery without angina pectoris: Secondary | ICD-10-CM | POA: Diagnosis not present

## 2019-10-12 DIAGNOSIS — E1151 Type 2 diabetes mellitus with diabetic peripheral angiopathy without gangrene: Secondary | ICD-10-CM | POA: Diagnosis not present

## 2019-10-12 DIAGNOSIS — Z993 Dependence on wheelchair: Secondary | ICD-10-CM | POA: Diagnosis not present

## 2019-10-12 DIAGNOSIS — Z9181 History of falling: Secondary | ICD-10-CM | POA: Diagnosis not present

## 2019-10-12 DIAGNOSIS — E538 Deficiency of other specified B group vitamins: Secondary | ICD-10-CM | POA: Diagnosis not present

## 2019-10-12 DIAGNOSIS — E1142 Type 2 diabetes mellitus with diabetic polyneuropathy: Secondary | ICD-10-CM | POA: Diagnosis not present

## 2019-10-12 DIAGNOSIS — D509 Iron deficiency anemia, unspecified: Secondary | ICD-10-CM | POA: Diagnosis not present

## 2019-10-12 DIAGNOSIS — J449 Chronic obstructive pulmonary disease, unspecified: Secondary | ICD-10-CM | POA: Diagnosis not present

## 2019-10-12 DIAGNOSIS — I10 Essential (primary) hypertension: Secondary | ICD-10-CM | POA: Diagnosis not present

## 2019-10-12 DIAGNOSIS — M47816 Spondylosis without myelopathy or radiculopathy, lumbar region: Secondary | ICD-10-CM | POA: Diagnosis not present

## 2019-10-12 DIAGNOSIS — H8113 Benign paroxysmal vertigo, bilateral: Secondary | ICD-10-CM | POA: Diagnosis not present

## 2019-10-12 DIAGNOSIS — K8020 Calculus of gallbladder without cholecystitis without obstruction: Secondary | ICD-10-CM | POA: Diagnosis not present

## 2019-10-16 DIAGNOSIS — I10 Essential (primary) hypertension: Secondary | ICD-10-CM | POA: Diagnosis not present

## 2019-10-16 DIAGNOSIS — E1142 Type 2 diabetes mellitus with diabetic polyneuropathy: Secondary | ICD-10-CM | POA: Diagnosis not present

## 2019-10-16 DIAGNOSIS — I739 Peripheral vascular disease, unspecified: Secondary | ICD-10-CM | POA: Diagnosis not present

## 2019-10-16 DIAGNOSIS — I252 Old myocardial infarction: Secondary | ICD-10-CM | POA: Diagnosis not present

## 2019-10-16 DIAGNOSIS — I2510 Atherosclerotic heart disease of native coronary artery without angina pectoris: Secondary | ICD-10-CM | POA: Diagnosis not present

## 2019-10-16 DIAGNOSIS — E785 Hyperlipidemia, unspecified: Secondary | ICD-10-CM | POA: Diagnosis not present

## 2019-10-16 DIAGNOSIS — I6523 Occlusion and stenosis of bilateral carotid arteries: Secondary | ICD-10-CM | POA: Diagnosis not present

## 2019-10-18 DIAGNOSIS — E1142 Type 2 diabetes mellitus with diabetic polyneuropathy: Secondary | ICD-10-CM | POA: Diagnosis not present

## 2019-10-18 DIAGNOSIS — S78111D Complete traumatic amputation at level between right hip and knee, subsequent encounter: Secondary | ICD-10-CM | POA: Diagnosis not present

## 2019-10-18 DIAGNOSIS — I10 Essential (primary) hypertension: Secondary | ICD-10-CM | POA: Diagnosis not present

## 2019-10-20 DIAGNOSIS — N400 Enlarged prostate without lower urinary tract symptoms: Secondary | ICD-10-CM | POA: Diagnosis not present

## 2019-10-20 DIAGNOSIS — J449 Chronic obstructive pulmonary disease, unspecified: Secondary | ICD-10-CM | POA: Diagnosis not present

## 2019-10-20 DIAGNOSIS — H35443 Age-related reticular degeneration of retina, bilateral: Secondary | ICD-10-CM | POA: Diagnosis not present

## 2019-10-20 DIAGNOSIS — K8020 Calculus of gallbladder without cholecystitis without obstruction: Secondary | ICD-10-CM | POA: Diagnosis not present

## 2019-10-20 DIAGNOSIS — H8113 Benign paroxysmal vertigo, bilateral: Secondary | ICD-10-CM | POA: Diagnosis not present

## 2019-10-20 DIAGNOSIS — E1151 Type 2 diabetes mellitus with diabetic peripheral angiopathy without gangrene: Secondary | ICD-10-CM | POA: Diagnosis not present

## 2019-10-20 DIAGNOSIS — I252 Old myocardial infarction: Secondary | ICD-10-CM | POA: Diagnosis not present

## 2019-10-20 DIAGNOSIS — Z89611 Acquired absence of right leg above knee: Secondary | ICD-10-CM | POA: Diagnosis not present

## 2019-10-20 DIAGNOSIS — Z87891 Personal history of nicotine dependence: Secondary | ICD-10-CM | POA: Diagnosis not present

## 2019-10-20 DIAGNOSIS — E785 Hyperlipidemia, unspecified: Secondary | ICD-10-CM | POA: Diagnosis not present

## 2019-10-20 DIAGNOSIS — Z955 Presence of coronary angioplasty implant and graft: Secondary | ICD-10-CM | POA: Diagnosis not present

## 2019-10-20 DIAGNOSIS — Z7984 Long term (current) use of oral hypoglycemic drugs: Secondary | ICD-10-CM | POA: Diagnosis not present

## 2019-10-20 DIAGNOSIS — M47816 Spondylosis without myelopathy or radiculopathy, lumbar region: Secondary | ICD-10-CM | POA: Diagnosis not present

## 2019-10-20 DIAGNOSIS — Z7982 Long term (current) use of aspirin: Secondary | ICD-10-CM | POA: Diagnosis not present

## 2019-10-20 DIAGNOSIS — E7800 Pure hypercholesterolemia, unspecified: Secondary | ICD-10-CM | POA: Diagnosis not present

## 2019-10-20 DIAGNOSIS — E538 Deficiency of other specified B group vitamins: Secondary | ICD-10-CM | POA: Diagnosis not present

## 2019-10-20 DIAGNOSIS — I2510 Atherosclerotic heart disease of native coronary artery without angina pectoris: Secondary | ICD-10-CM | POA: Diagnosis not present

## 2019-10-20 DIAGNOSIS — Z993 Dependence on wheelchair: Secondary | ICD-10-CM | POA: Diagnosis not present

## 2019-10-20 DIAGNOSIS — D509 Iron deficiency anemia, unspecified: Secondary | ICD-10-CM | POA: Diagnosis not present

## 2019-10-20 DIAGNOSIS — E1142 Type 2 diabetes mellitus with diabetic polyneuropathy: Secondary | ICD-10-CM | POA: Diagnosis not present

## 2019-10-20 DIAGNOSIS — I10 Essential (primary) hypertension: Secondary | ICD-10-CM | POA: Diagnosis not present

## 2019-10-20 DIAGNOSIS — S78111D Complete traumatic amputation at level between right hip and knee, subsequent encounter: Secondary | ICD-10-CM | POA: Diagnosis not present

## 2019-10-20 DIAGNOSIS — Z9181 History of falling: Secondary | ICD-10-CM | POA: Diagnosis not present

## 2019-10-23 DIAGNOSIS — Z89611 Acquired absence of right leg above knee: Secondary | ICD-10-CM | POA: Diagnosis not present

## 2019-10-23 DIAGNOSIS — E538 Deficiency of other specified B group vitamins: Secondary | ICD-10-CM | POA: Diagnosis not present

## 2019-10-23 DIAGNOSIS — E7800 Pure hypercholesterolemia, unspecified: Secondary | ICD-10-CM | POA: Diagnosis not present

## 2019-10-23 DIAGNOSIS — I2510 Atherosclerotic heart disease of native coronary artery without angina pectoris: Secondary | ICD-10-CM | POA: Diagnosis not present

## 2019-10-23 DIAGNOSIS — Z993 Dependence on wheelchair: Secondary | ICD-10-CM | POA: Diagnosis not present

## 2019-10-23 DIAGNOSIS — H35443 Age-related reticular degeneration of retina, bilateral: Secondary | ICD-10-CM | POA: Diagnosis not present

## 2019-10-23 DIAGNOSIS — E1151 Type 2 diabetes mellitus with diabetic peripheral angiopathy without gangrene: Secondary | ICD-10-CM | POA: Diagnosis not present

## 2019-10-23 DIAGNOSIS — Z7982 Long term (current) use of aspirin: Secondary | ICD-10-CM | POA: Diagnosis not present

## 2019-10-23 DIAGNOSIS — D509 Iron deficiency anemia, unspecified: Secondary | ICD-10-CM | POA: Diagnosis not present

## 2019-10-23 DIAGNOSIS — K8020 Calculus of gallbladder without cholecystitis without obstruction: Secondary | ICD-10-CM | POA: Diagnosis not present

## 2019-10-23 DIAGNOSIS — H8113 Benign paroxysmal vertigo, bilateral: Secondary | ICD-10-CM | POA: Diagnosis not present

## 2019-10-23 DIAGNOSIS — I10 Essential (primary) hypertension: Secondary | ICD-10-CM | POA: Diagnosis not present

## 2019-10-23 DIAGNOSIS — Z955 Presence of coronary angioplasty implant and graft: Secondary | ICD-10-CM | POA: Diagnosis not present

## 2019-10-23 DIAGNOSIS — M47816 Spondylosis without myelopathy or radiculopathy, lumbar region: Secondary | ICD-10-CM | POA: Diagnosis not present

## 2019-10-23 DIAGNOSIS — Z9181 History of falling: Secondary | ICD-10-CM | POA: Diagnosis not present

## 2019-10-23 DIAGNOSIS — E1142 Type 2 diabetes mellitus with diabetic polyneuropathy: Secondary | ICD-10-CM | POA: Diagnosis not present

## 2019-10-23 DIAGNOSIS — I252 Old myocardial infarction: Secondary | ICD-10-CM | POA: Diagnosis not present

## 2019-10-23 DIAGNOSIS — E785 Hyperlipidemia, unspecified: Secondary | ICD-10-CM | POA: Diagnosis not present

## 2019-10-23 DIAGNOSIS — N400 Enlarged prostate without lower urinary tract symptoms: Secondary | ICD-10-CM | POA: Diagnosis not present

## 2019-10-23 DIAGNOSIS — J449 Chronic obstructive pulmonary disease, unspecified: Secondary | ICD-10-CM | POA: Diagnosis not present

## 2019-10-23 DIAGNOSIS — S78111D Complete traumatic amputation at level between right hip and knee, subsequent encounter: Secondary | ICD-10-CM | POA: Diagnosis not present

## 2019-10-23 DIAGNOSIS — Z87891 Personal history of nicotine dependence: Secondary | ICD-10-CM | POA: Diagnosis not present

## 2019-10-23 DIAGNOSIS — Z7984 Long term (current) use of oral hypoglycemic drugs: Secondary | ICD-10-CM | POA: Diagnosis not present

## 2019-10-25 DIAGNOSIS — I6523 Occlusion and stenosis of bilateral carotid arteries: Secondary | ICD-10-CM | POA: Diagnosis not present

## 2019-10-28 DIAGNOSIS — D509 Iron deficiency anemia, unspecified: Secondary | ICD-10-CM | POA: Diagnosis not present

## 2019-10-28 DIAGNOSIS — Z993 Dependence on wheelchair: Secondary | ICD-10-CM | POA: Diagnosis not present

## 2019-10-28 DIAGNOSIS — M47816 Spondylosis without myelopathy or radiculopathy, lumbar region: Secondary | ICD-10-CM | POA: Diagnosis not present

## 2019-10-28 DIAGNOSIS — K8020 Calculus of gallbladder without cholecystitis without obstruction: Secondary | ICD-10-CM | POA: Diagnosis not present

## 2019-10-28 DIAGNOSIS — Z87891 Personal history of nicotine dependence: Secondary | ICD-10-CM | POA: Diagnosis not present

## 2019-10-28 DIAGNOSIS — I10 Essential (primary) hypertension: Secondary | ICD-10-CM | POA: Diagnosis not present

## 2019-10-28 DIAGNOSIS — Z7982 Long term (current) use of aspirin: Secondary | ICD-10-CM | POA: Diagnosis not present

## 2019-10-28 DIAGNOSIS — E1142 Type 2 diabetes mellitus with diabetic polyneuropathy: Secondary | ICD-10-CM | POA: Diagnosis not present

## 2019-10-28 DIAGNOSIS — I2510 Atherosclerotic heart disease of native coronary artery without angina pectoris: Secondary | ICD-10-CM | POA: Diagnosis not present

## 2019-10-28 DIAGNOSIS — N400 Enlarged prostate without lower urinary tract symptoms: Secondary | ICD-10-CM | POA: Diagnosis not present

## 2019-10-28 DIAGNOSIS — I252 Old myocardial infarction: Secondary | ICD-10-CM | POA: Diagnosis not present

## 2019-10-28 DIAGNOSIS — H8113 Benign paroxysmal vertigo, bilateral: Secondary | ICD-10-CM | POA: Diagnosis not present

## 2019-10-28 DIAGNOSIS — Z9181 History of falling: Secondary | ICD-10-CM | POA: Diagnosis not present

## 2019-10-28 DIAGNOSIS — H35443 Age-related reticular degeneration of retina, bilateral: Secondary | ICD-10-CM | POA: Diagnosis not present

## 2019-10-28 DIAGNOSIS — S78111D Complete traumatic amputation at level between right hip and knee, subsequent encounter: Secondary | ICD-10-CM | POA: Diagnosis not present

## 2019-10-28 DIAGNOSIS — E7800 Pure hypercholesterolemia, unspecified: Secondary | ICD-10-CM | POA: Diagnosis not present

## 2019-10-28 DIAGNOSIS — E785 Hyperlipidemia, unspecified: Secondary | ICD-10-CM | POA: Diagnosis not present

## 2019-10-28 DIAGNOSIS — E1151 Type 2 diabetes mellitus with diabetic peripheral angiopathy without gangrene: Secondary | ICD-10-CM | POA: Diagnosis not present

## 2019-10-28 DIAGNOSIS — J449 Chronic obstructive pulmonary disease, unspecified: Secondary | ICD-10-CM | POA: Diagnosis not present

## 2019-10-28 DIAGNOSIS — Z7984 Long term (current) use of oral hypoglycemic drugs: Secondary | ICD-10-CM | POA: Diagnosis not present

## 2019-10-28 DIAGNOSIS — Z955 Presence of coronary angioplasty implant and graft: Secondary | ICD-10-CM | POA: Diagnosis not present

## 2019-10-28 DIAGNOSIS — E538 Deficiency of other specified B group vitamins: Secondary | ICD-10-CM | POA: Diagnosis not present

## 2019-10-28 DIAGNOSIS — Z89611 Acquired absence of right leg above knee: Secondary | ICD-10-CM | POA: Diagnosis not present

## 2019-11-01 DIAGNOSIS — E1142 Type 2 diabetes mellitus with diabetic polyneuropathy: Secondary | ICD-10-CM | POA: Diagnosis not present

## 2019-11-01 DIAGNOSIS — Z7984 Long term (current) use of oral hypoglycemic drugs: Secondary | ICD-10-CM | POA: Diagnosis not present

## 2019-11-01 DIAGNOSIS — Z87891 Personal history of nicotine dependence: Secondary | ICD-10-CM | POA: Diagnosis not present

## 2019-11-01 DIAGNOSIS — H8113 Benign paroxysmal vertigo, bilateral: Secondary | ICD-10-CM | POA: Diagnosis not present

## 2019-11-01 DIAGNOSIS — Z955 Presence of coronary angioplasty implant and graft: Secondary | ICD-10-CM | POA: Diagnosis not present

## 2019-11-01 DIAGNOSIS — D509 Iron deficiency anemia, unspecified: Secondary | ICD-10-CM | POA: Diagnosis not present

## 2019-11-01 DIAGNOSIS — I10 Essential (primary) hypertension: Secondary | ICD-10-CM | POA: Diagnosis not present

## 2019-11-01 DIAGNOSIS — E1151 Type 2 diabetes mellitus with diabetic peripheral angiopathy without gangrene: Secondary | ICD-10-CM | POA: Diagnosis not present

## 2019-11-01 DIAGNOSIS — Z993 Dependence on wheelchair: Secondary | ICD-10-CM | POA: Diagnosis not present

## 2019-11-01 DIAGNOSIS — E7800 Pure hypercholesterolemia, unspecified: Secondary | ICD-10-CM | POA: Diagnosis not present

## 2019-11-01 DIAGNOSIS — M47816 Spondylosis without myelopathy or radiculopathy, lumbar region: Secondary | ICD-10-CM | POA: Diagnosis not present

## 2019-11-01 DIAGNOSIS — E538 Deficiency of other specified B group vitamins: Secondary | ICD-10-CM | POA: Diagnosis not present

## 2019-11-01 DIAGNOSIS — Z89611 Acquired absence of right leg above knee: Secondary | ICD-10-CM | POA: Diagnosis not present

## 2019-11-01 DIAGNOSIS — J449 Chronic obstructive pulmonary disease, unspecified: Secondary | ICD-10-CM | POA: Diagnosis not present

## 2019-11-01 DIAGNOSIS — I252 Old myocardial infarction: Secondary | ICD-10-CM | POA: Diagnosis not present

## 2019-11-01 DIAGNOSIS — H35443 Age-related reticular degeneration of retina, bilateral: Secondary | ICD-10-CM | POA: Diagnosis not present

## 2019-11-01 DIAGNOSIS — K8020 Calculus of gallbladder without cholecystitis without obstruction: Secondary | ICD-10-CM | POA: Diagnosis not present

## 2019-11-01 DIAGNOSIS — E785 Hyperlipidemia, unspecified: Secondary | ICD-10-CM | POA: Diagnosis not present

## 2019-11-01 DIAGNOSIS — I2510 Atherosclerotic heart disease of native coronary artery without angina pectoris: Secondary | ICD-10-CM | POA: Diagnosis not present

## 2019-11-01 DIAGNOSIS — N400 Enlarged prostate without lower urinary tract symptoms: Secondary | ICD-10-CM | POA: Diagnosis not present

## 2019-11-01 DIAGNOSIS — Z9181 History of falling: Secondary | ICD-10-CM | POA: Diagnosis not present

## 2019-11-01 DIAGNOSIS — S78111D Complete traumatic amputation at level between right hip and knee, subsequent encounter: Secondary | ICD-10-CM | POA: Diagnosis not present

## 2019-11-01 DIAGNOSIS — Z7982 Long term (current) use of aspirin: Secondary | ICD-10-CM | POA: Diagnosis not present

## 2019-11-06 DIAGNOSIS — Z89611 Acquired absence of right leg above knee: Secondary | ICD-10-CM | POA: Diagnosis not present

## 2019-11-06 DIAGNOSIS — E1151 Type 2 diabetes mellitus with diabetic peripheral angiopathy without gangrene: Secondary | ICD-10-CM | POA: Diagnosis not present

## 2019-11-06 DIAGNOSIS — E785 Hyperlipidemia, unspecified: Secondary | ICD-10-CM | POA: Diagnosis not present

## 2019-11-06 DIAGNOSIS — M47816 Spondylosis without myelopathy or radiculopathy, lumbar region: Secondary | ICD-10-CM | POA: Diagnosis not present

## 2019-11-06 DIAGNOSIS — Z87891 Personal history of nicotine dependence: Secondary | ICD-10-CM | POA: Diagnosis not present

## 2019-11-06 DIAGNOSIS — Z7984 Long term (current) use of oral hypoglycemic drugs: Secondary | ICD-10-CM | POA: Diagnosis not present

## 2019-11-06 DIAGNOSIS — H8113 Benign paroxysmal vertigo, bilateral: Secondary | ICD-10-CM | POA: Diagnosis not present

## 2019-11-06 DIAGNOSIS — I2510 Atherosclerotic heart disease of native coronary artery without angina pectoris: Secondary | ICD-10-CM | POA: Diagnosis not present

## 2019-11-06 DIAGNOSIS — I252 Old myocardial infarction: Secondary | ICD-10-CM | POA: Diagnosis not present

## 2019-11-06 DIAGNOSIS — D509 Iron deficiency anemia, unspecified: Secondary | ICD-10-CM | POA: Diagnosis not present

## 2019-11-06 DIAGNOSIS — Z955 Presence of coronary angioplasty implant and graft: Secondary | ICD-10-CM | POA: Diagnosis not present

## 2019-11-06 DIAGNOSIS — I10 Essential (primary) hypertension: Secondary | ICD-10-CM | POA: Diagnosis not present

## 2019-11-06 DIAGNOSIS — J449 Chronic obstructive pulmonary disease, unspecified: Secondary | ICD-10-CM | POA: Diagnosis not present

## 2019-11-06 DIAGNOSIS — K8020 Calculus of gallbladder without cholecystitis without obstruction: Secondary | ICD-10-CM | POA: Diagnosis not present

## 2019-11-06 DIAGNOSIS — N400 Enlarged prostate without lower urinary tract symptoms: Secondary | ICD-10-CM | POA: Diagnosis not present

## 2019-11-06 DIAGNOSIS — Z9181 History of falling: Secondary | ICD-10-CM | POA: Diagnosis not present

## 2019-11-06 DIAGNOSIS — E1142 Type 2 diabetes mellitus with diabetic polyneuropathy: Secondary | ICD-10-CM | POA: Diagnosis not present

## 2019-11-06 DIAGNOSIS — E7800 Pure hypercholesterolemia, unspecified: Secondary | ICD-10-CM | POA: Diagnosis not present

## 2019-11-06 DIAGNOSIS — Z993 Dependence on wheelchair: Secondary | ICD-10-CM | POA: Diagnosis not present

## 2019-11-06 DIAGNOSIS — Z7982 Long term (current) use of aspirin: Secondary | ICD-10-CM | POA: Diagnosis not present

## 2019-11-06 DIAGNOSIS — H35443 Age-related reticular degeneration of retina, bilateral: Secondary | ICD-10-CM | POA: Diagnosis not present

## 2019-11-06 DIAGNOSIS — E538 Deficiency of other specified B group vitamins: Secondary | ICD-10-CM | POA: Diagnosis not present

## 2019-11-06 DIAGNOSIS — S78111D Complete traumatic amputation at level between right hip and knee, subsequent encounter: Secondary | ICD-10-CM | POA: Diagnosis not present

## 2019-11-08 DIAGNOSIS — E785 Hyperlipidemia, unspecified: Secondary | ICD-10-CM | POA: Diagnosis not present

## 2019-11-08 DIAGNOSIS — K8020 Calculus of gallbladder without cholecystitis without obstruction: Secondary | ICD-10-CM | POA: Diagnosis not present

## 2019-11-08 DIAGNOSIS — Z993 Dependence on wheelchair: Secondary | ICD-10-CM | POA: Diagnosis not present

## 2019-11-08 DIAGNOSIS — I10 Essential (primary) hypertension: Secondary | ICD-10-CM | POA: Diagnosis not present

## 2019-11-08 DIAGNOSIS — J449 Chronic obstructive pulmonary disease, unspecified: Secondary | ICD-10-CM | POA: Diagnosis not present

## 2019-11-08 DIAGNOSIS — E1151 Type 2 diabetes mellitus with diabetic peripheral angiopathy without gangrene: Secondary | ICD-10-CM | POA: Diagnosis not present

## 2019-11-08 DIAGNOSIS — Z89611 Acquired absence of right leg above knee: Secondary | ICD-10-CM | POA: Diagnosis not present

## 2019-11-08 DIAGNOSIS — S78111D Complete traumatic amputation at level between right hip and knee, subsequent encounter: Secondary | ICD-10-CM | POA: Diagnosis not present

## 2019-11-08 DIAGNOSIS — E7800 Pure hypercholesterolemia, unspecified: Secondary | ICD-10-CM | POA: Diagnosis not present

## 2019-11-08 DIAGNOSIS — H8113 Benign paroxysmal vertigo, bilateral: Secondary | ICD-10-CM | POA: Diagnosis not present

## 2019-11-08 DIAGNOSIS — Z955 Presence of coronary angioplasty implant and graft: Secondary | ICD-10-CM | POA: Diagnosis not present

## 2019-11-08 DIAGNOSIS — Z7984 Long term (current) use of oral hypoglycemic drugs: Secondary | ICD-10-CM | POA: Diagnosis not present

## 2019-11-08 DIAGNOSIS — I2510 Atherosclerotic heart disease of native coronary artery without angina pectoris: Secondary | ICD-10-CM | POA: Diagnosis not present

## 2019-11-08 DIAGNOSIS — Z7982 Long term (current) use of aspirin: Secondary | ICD-10-CM | POA: Diagnosis not present

## 2019-11-08 DIAGNOSIS — Z87891 Personal history of nicotine dependence: Secondary | ICD-10-CM | POA: Diagnosis not present

## 2019-11-08 DIAGNOSIS — N400 Enlarged prostate without lower urinary tract symptoms: Secondary | ICD-10-CM | POA: Diagnosis not present

## 2019-11-08 DIAGNOSIS — I252 Old myocardial infarction: Secondary | ICD-10-CM | POA: Diagnosis not present

## 2019-11-08 DIAGNOSIS — M47816 Spondylosis without myelopathy or radiculopathy, lumbar region: Secondary | ICD-10-CM | POA: Diagnosis not present

## 2019-11-08 DIAGNOSIS — H35443 Age-related reticular degeneration of retina, bilateral: Secondary | ICD-10-CM | POA: Diagnosis not present

## 2019-11-08 DIAGNOSIS — D509 Iron deficiency anemia, unspecified: Secondary | ICD-10-CM | POA: Diagnosis not present

## 2019-11-08 DIAGNOSIS — Z9181 History of falling: Secondary | ICD-10-CM | POA: Diagnosis not present

## 2019-11-08 DIAGNOSIS — E1142 Type 2 diabetes mellitus with diabetic polyneuropathy: Secondary | ICD-10-CM | POA: Diagnosis not present

## 2019-11-08 DIAGNOSIS — E538 Deficiency of other specified B group vitamins: Secondary | ICD-10-CM | POA: Diagnosis not present

## 2019-11-14 DIAGNOSIS — I252 Old myocardial infarction: Secondary | ICD-10-CM | POA: Diagnosis not present

## 2019-11-14 DIAGNOSIS — Z87891 Personal history of nicotine dependence: Secondary | ICD-10-CM | POA: Diagnosis not present

## 2019-11-14 DIAGNOSIS — E1151 Type 2 diabetes mellitus with diabetic peripheral angiopathy without gangrene: Secondary | ICD-10-CM | POA: Diagnosis not present

## 2019-11-14 DIAGNOSIS — E538 Deficiency of other specified B group vitamins: Secondary | ICD-10-CM | POA: Diagnosis not present

## 2019-11-14 DIAGNOSIS — H35443 Age-related reticular degeneration of retina, bilateral: Secondary | ICD-10-CM | POA: Diagnosis not present

## 2019-11-14 DIAGNOSIS — Z955 Presence of coronary angioplasty implant and graft: Secondary | ICD-10-CM | POA: Diagnosis not present

## 2019-11-14 DIAGNOSIS — E785 Hyperlipidemia, unspecified: Secondary | ICD-10-CM | POA: Diagnosis not present

## 2019-11-14 DIAGNOSIS — M47816 Spondylosis without myelopathy or radiculopathy, lumbar region: Secondary | ICD-10-CM | POA: Diagnosis not present

## 2019-11-14 DIAGNOSIS — I2510 Atherosclerotic heart disease of native coronary artery without angina pectoris: Secondary | ICD-10-CM | POA: Diagnosis not present

## 2019-11-14 DIAGNOSIS — I10 Essential (primary) hypertension: Secondary | ICD-10-CM | POA: Diagnosis not present

## 2019-11-14 DIAGNOSIS — H8113 Benign paroxysmal vertigo, bilateral: Secondary | ICD-10-CM | POA: Diagnosis not present

## 2019-11-14 DIAGNOSIS — Z9181 History of falling: Secondary | ICD-10-CM | POA: Diagnosis not present

## 2019-11-14 DIAGNOSIS — Z7982 Long term (current) use of aspirin: Secondary | ICD-10-CM | POA: Diagnosis not present

## 2019-11-14 DIAGNOSIS — E7800 Pure hypercholesterolemia, unspecified: Secondary | ICD-10-CM | POA: Diagnosis not present

## 2019-11-14 DIAGNOSIS — D509 Iron deficiency anemia, unspecified: Secondary | ICD-10-CM | POA: Diagnosis not present

## 2019-11-14 DIAGNOSIS — N400 Enlarged prostate without lower urinary tract symptoms: Secondary | ICD-10-CM | POA: Diagnosis not present

## 2019-11-14 DIAGNOSIS — Z993 Dependence on wheelchair: Secondary | ICD-10-CM | POA: Diagnosis not present

## 2019-11-14 DIAGNOSIS — K8020 Calculus of gallbladder without cholecystitis without obstruction: Secondary | ICD-10-CM | POA: Diagnosis not present

## 2019-11-14 DIAGNOSIS — E1142 Type 2 diabetes mellitus with diabetic polyneuropathy: Secondary | ICD-10-CM | POA: Diagnosis not present

## 2019-11-14 DIAGNOSIS — Z89611 Acquired absence of right leg above knee: Secondary | ICD-10-CM | POA: Diagnosis not present

## 2019-11-14 DIAGNOSIS — J449 Chronic obstructive pulmonary disease, unspecified: Secondary | ICD-10-CM | POA: Diagnosis not present

## 2019-11-14 DIAGNOSIS — S78111D Complete traumatic amputation at level between right hip and knee, subsequent encounter: Secondary | ICD-10-CM | POA: Diagnosis not present

## 2019-11-14 DIAGNOSIS — Z7984 Long term (current) use of oral hypoglycemic drugs: Secondary | ICD-10-CM | POA: Diagnosis not present

## 2019-11-15 DIAGNOSIS — M064 Inflammatory polyarthropathy: Secondary | ICD-10-CM | POA: Diagnosis not present

## 2019-11-15 DIAGNOSIS — G546 Phantom limb syndrome with pain: Secondary | ICD-10-CM | POA: Diagnosis not present

## 2019-11-20 DIAGNOSIS — Z89611 Acquired absence of right leg above knee: Secondary | ICD-10-CM | POA: Diagnosis not present

## 2019-11-20 DIAGNOSIS — E1151 Type 2 diabetes mellitus with diabetic peripheral angiopathy without gangrene: Secondary | ICD-10-CM | POA: Diagnosis not present

## 2019-11-20 DIAGNOSIS — Z7984 Long term (current) use of oral hypoglycemic drugs: Secondary | ICD-10-CM | POA: Diagnosis not present

## 2019-11-20 DIAGNOSIS — I2510 Atherosclerotic heart disease of native coronary artery without angina pectoris: Secondary | ICD-10-CM | POA: Diagnosis not present

## 2019-11-20 DIAGNOSIS — H8113 Benign paroxysmal vertigo, bilateral: Secondary | ICD-10-CM | POA: Diagnosis not present

## 2019-11-20 DIAGNOSIS — K8020 Calculus of gallbladder without cholecystitis without obstruction: Secondary | ICD-10-CM | POA: Diagnosis not present

## 2019-11-20 DIAGNOSIS — J449 Chronic obstructive pulmonary disease, unspecified: Secondary | ICD-10-CM | POA: Diagnosis not present

## 2019-11-20 DIAGNOSIS — S78111D Complete traumatic amputation at level between right hip and knee, subsequent encounter: Secondary | ICD-10-CM | POA: Diagnosis not present

## 2019-11-20 DIAGNOSIS — M47816 Spondylosis without myelopathy or radiculopathy, lumbar region: Secondary | ICD-10-CM | POA: Diagnosis not present

## 2019-11-20 DIAGNOSIS — I252 Old myocardial infarction: Secondary | ICD-10-CM | POA: Diagnosis not present

## 2019-11-20 DIAGNOSIS — D509 Iron deficiency anemia, unspecified: Secondary | ICD-10-CM | POA: Diagnosis not present

## 2019-11-20 DIAGNOSIS — Z7982 Long term (current) use of aspirin: Secondary | ICD-10-CM | POA: Diagnosis not present

## 2019-11-20 DIAGNOSIS — E538 Deficiency of other specified B group vitamins: Secondary | ICD-10-CM | POA: Diagnosis not present

## 2019-11-20 DIAGNOSIS — E7800 Pure hypercholesterolemia, unspecified: Secondary | ICD-10-CM | POA: Diagnosis not present

## 2019-11-20 DIAGNOSIS — Z9181 History of falling: Secondary | ICD-10-CM | POA: Diagnosis not present

## 2019-11-20 DIAGNOSIS — E1142 Type 2 diabetes mellitus with diabetic polyneuropathy: Secondary | ICD-10-CM | POA: Diagnosis not present

## 2019-11-20 DIAGNOSIS — N400 Enlarged prostate without lower urinary tract symptoms: Secondary | ICD-10-CM | POA: Diagnosis not present

## 2019-11-20 DIAGNOSIS — Z955 Presence of coronary angioplasty implant and graft: Secondary | ICD-10-CM | POA: Diagnosis not present

## 2019-11-20 DIAGNOSIS — Z87891 Personal history of nicotine dependence: Secondary | ICD-10-CM | POA: Diagnosis not present

## 2019-11-20 DIAGNOSIS — E785 Hyperlipidemia, unspecified: Secondary | ICD-10-CM | POA: Diagnosis not present

## 2019-11-20 DIAGNOSIS — Z993 Dependence on wheelchair: Secondary | ICD-10-CM | POA: Diagnosis not present

## 2019-11-20 DIAGNOSIS — H35443 Age-related reticular degeneration of retina, bilateral: Secondary | ICD-10-CM | POA: Diagnosis not present

## 2019-11-20 DIAGNOSIS — I10 Essential (primary) hypertension: Secondary | ICD-10-CM | POA: Diagnosis not present

## 2019-11-27 DIAGNOSIS — N400 Enlarged prostate without lower urinary tract symptoms: Secondary | ICD-10-CM | POA: Diagnosis not present

## 2019-11-27 DIAGNOSIS — H8113 Benign paroxysmal vertigo, bilateral: Secondary | ICD-10-CM | POA: Diagnosis not present

## 2019-11-27 DIAGNOSIS — I10 Essential (primary) hypertension: Secondary | ICD-10-CM | POA: Diagnosis not present

## 2019-11-27 DIAGNOSIS — E1142 Type 2 diabetes mellitus with diabetic polyneuropathy: Secondary | ICD-10-CM | POA: Diagnosis not present

## 2019-11-27 DIAGNOSIS — E785 Hyperlipidemia, unspecified: Secondary | ICD-10-CM | POA: Diagnosis not present

## 2019-11-27 DIAGNOSIS — I252 Old myocardial infarction: Secondary | ICD-10-CM | POA: Diagnosis not present

## 2019-11-27 DIAGNOSIS — Z9181 History of falling: Secondary | ICD-10-CM | POA: Diagnosis not present

## 2019-11-27 DIAGNOSIS — S78111D Complete traumatic amputation at level between right hip and knee, subsequent encounter: Secondary | ICD-10-CM | POA: Diagnosis not present

## 2019-11-27 DIAGNOSIS — Z993 Dependence on wheelchair: Secondary | ICD-10-CM | POA: Diagnosis not present

## 2019-11-27 DIAGNOSIS — Z87891 Personal history of nicotine dependence: Secondary | ICD-10-CM | POA: Diagnosis not present

## 2019-11-27 DIAGNOSIS — E538 Deficiency of other specified B group vitamins: Secondary | ICD-10-CM | POA: Diagnosis not present

## 2019-11-27 DIAGNOSIS — J449 Chronic obstructive pulmonary disease, unspecified: Secondary | ICD-10-CM | POA: Diagnosis not present

## 2019-11-27 DIAGNOSIS — Z955 Presence of coronary angioplasty implant and graft: Secondary | ICD-10-CM | POA: Diagnosis not present

## 2019-11-27 DIAGNOSIS — M47816 Spondylosis without myelopathy or radiculopathy, lumbar region: Secondary | ICD-10-CM | POA: Diagnosis not present

## 2019-11-27 DIAGNOSIS — E1151 Type 2 diabetes mellitus with diabetic peripheral angiopathy without gangrene: Secondary | ICD-10-CM | POA: Diagnosis not present

## 2019-11-27 DIAGNOSIS — Z7984 Long term (current) use of oral hypoglycemic drugs: Secondary | ICD-10-CM | POA: Diagnosis not present

## 2019-11-27 DIAGNOSIS — E7800 Pure hypercholesterolemia, unspecified: Secondary | ICD-10-CM | POA: Diagnosis not present

## 2019-11-27 DIAGNOSIS — Z7982 Long term (current) use of aspirin: Secondary | ICD-10-CM | POA: Diagnosis not present

## 2019-11-27 DIAGNOSIS — D509 Iron deficiency anemia, unspecified: Secondary | ICD-10-CM | POA: Diagnosis not present

## 2019-11-27 DIAGNOSIS — H35443 Age-related reticular degeneration of retina, bilateral: Secondary | ICD-10-CM | POA: Diagnosis not present

## 2019-11-27 DIAGNOSIS — Z89611 Acquired absence of right leg above knee: Secondary | ICD-10-CM | POA: Diagnosis not present

## 2019-11-27 DIAGNOSIS — I2510 Atherosclerotic heart disease of native coronary artery without angina pectoris: Secondary | ICD-10-CM | POA: Diagnosis not present

## 2019-11-27 DIAGNOSIS — K8020 Calculus of gallbladder without cholecystitis without obstruction: Secondary | ICD-10-CM | POA: Diagnosis not present

## 2019-12-03 DIAGNOSIS — E1151 Type 2 diabetes mellitus with diabetic peripheral angiopathy without gangrene: Secondary | ICD-10-CM | POA: Diagnosis not present

## 2019-12-03 DIAGNOSIS — I252 Old myocardial infarction: Secondary | ICD-10-CM | POA: Diagnosis not present

## 2019-12-03 DIAGNOSIS — Z7984 Long term (current) use of oral hypoglycemic drugs: Secondary | ICD-10-CM | POA: Diagnosis not present

## 2019-12-03 DIAGNOSIS — S78111D Complete traumatic amputation at level between right hip and knee, subsequent encounter: Secondary | ICD-10-CM | POA: Diagnosis not present

## 2019-12-03 DIAGNOSIS — D509 Iron deficiency anemia, unspecified: Secondary | ICD-10-CM | POA: Diagnosis not present

## 2019-12-03 DIAGNOSIS — I2510 Atherosclerotic heart disease of native coronary artery without angina pectoris: Secondary | ICD-10-CM | POA: Diagnosis not present

## 2019-12-03 DIAGNOSIS — N400 Enlarged prostate without lower urinary tract symptoms: Secondary | ICD-10-CM | POA: Diagnosis not present

## 2019-12-03 DIAGNOSIS — E538 Deficiency of other specified B group vitamins: Secondary | ICD-10-CM | POA: Diagnosis not present

## 2019-12-03 DIAGNOSIS — M47816 Spondylosis without myelopathy or radiculopathy, lumbar region: Secondary | ICD-10-CM | POA: Diagnosis not present

## 2019-12-03 DIAGNOSIS — H35443 Age-related reticular degeneration of retina, bilateral: Secondary | ICD-10-CM | POA: Diagnosis not present

## 2019-12-03 DIAGNOSIS — H8113 Benign paroxysmal vertigo, bilateral: Secondary | ICD-10-CM | POA: Diagnosis not present

## 2019-12-03 DIAGNOSIS — Z9181 History of falling: Secondary | ICD-10-CM | POA: Diagnosis not present

## 2019-12-03 DIAGNOSIS — Z87891 Personal history of nicotine dependence: Secondary | ICD-10-CM | POA: Diagnosis not present

## 2019-12-03 DIAGNOSIS — E7800 Pure hypercholesterolemia, unspecified: Secondary | ICD-10-CM | POA: Diagnosis not present

## 2019-12-03 DIAGNOSIS — Z7982 Long term (current) use of aspirin: Secondary | ICD-10-CM | POA: Diagnosis not present

## 2019-12-03 DIAGNOSIS — Z993 Dependence on wheelchair: Secondary | ICD-10-CM | POA: Diagnosis not present

## 2019-12-03 DIAGNOSIS — J449 Chronic obstructive pulmonary disease, unspecified: Secondary | ICD-10-CM | POA: Diagnosis not present

## 2019-12-03 DIAGNOSIS — I10 Essential (primary) hypertension: Secondary | ICD-10-CM | POA: Diagnosis not present

## 2019-12-03 DIAGNOSIS — Z89611 Acquired absence of right leg above knee: Secondary | ICD-10-CM | POA: Diagnosis not present

## 2019-12-03 DIAGNOSIS — E1142 Type 2 diabetes mellitus with diabetic polyneuropathy: Secondary | ICD-10-CM | POA: Diagnosis not present

## 2019-12-03 DIAGNOSIS — K8020 Calculus of gallbladder without cholecystitis without obstruction: Secondary | ICD-10-CM | POA: Diagnosis not present

## 2019-12-03 DIAGNOSIS — E785 Hyperlipidemia, unspecified: Secondary | ICD-10-CM | POA: Diagnosis not present

## 2019-12-03 DIAGNOSIS — Z955 Presence of coronary angioplasty implant and graft: Secondary | ICD-10-CM | POA: Diagnosis not present

## 2020-01-25 DIAGNOSIS — L03116 Cellulitis of left lower limb: Secondary | ICD-10-CM | POA: Diagnosis not present

## 2020-02-06 DIAGNOSIS — Z95828 Presence of other vascular implants and grafts: Secondary | ICD-10-CM | POA: Diagnosis not present

## 2020-02-06 DIAGNOSIS — I6523 Occlusion and stenosis of bilateral carotid arteries: Secondary | ICD-10-CM | POA: Diagnosis not present

## 2020-02-06 DIAGNOSIS — I70229 Atherosclerosis of native arteries of extremities with rest pain, unspecified extremity: Secondary | ICD-10-CM | POA: Diagnosis not present

## 2020-02-12 DIAGNOSIS — S80822A Blister (nonthermal), left lower leg, initial encounter: Secondary | ICD-10-CM | POA: Diagnosis not present

## 2020-02-12 DIAGNOSIS — L03116 Cellulitis of left lower limb: Secondary | ICD-10-CM | POA: Diagnosis not present

## 2020-02-20 DIAGNOSIS — R238 Other skin changes: Secondary | ICD-10-CM | POA: Diagnosis not present

## 2020-02-27 DIAGNOSIS — Z7982 Long term (current) use of aspirin: Secondary | ICD-10-CM | POA: Diagnosis not present

## 2020-02-27 DIAGNOSIS — E1142 Type 2 diabetes mellitus with diabetic polyneuropathy: Secondary | ICD-10-CM | POA: Diagnosis not present

## 2020-02-27 DIAGNOSIS — E559 Vitamin D deficiency, unspecified: Secondary | ICD-10-CM | POA: Diagnosis not present

## 2020-02-27 DIAGNOSIS — L570 Actinic keratosis: Secondary | ICD-10-CM | POA: Diagnosis not present

## 2020-02-27 DIAGNOSIS — R238 Other skin changes: Secondary | ICD-10-CM | POA: Diagnosis not present

## 2020-02-27 DIAGNOSIS — Z7984 Long term (current) use of oral hypoglycemic drugs: Secondary | ICD-10-CM | POA: Diagnosis not present

## 2020-02-27 DIAGNOSIS — Z7902 Long term (current) use of antithrombotics/antiplatelets: Secondary | ICD-10-CM | POA: Diagnosis not present

## 2020-03-05 DIAGNOSIS — L97511 Non-pressure chronic ulcer of other part of right foot limited to breakdown of skin: Secondary | ICD-10-CM | POA: Diagnosis not present

## 2020-03-05 DIAGNOSIS — E11621 Type 2 diabetes mellitus with foot ulcer: Secondary | ICD-10-CM | POA: Diagnosis not present

## 2020-03-10 DIAGNOSIS — I739 Peripheral vascular disease, unspecified: Secondary | ICD-10-CM | POA: Diagnosis not present

## 2020-03-10 DIAGNOSIS — E08621 Diabetes mellitus due to underlying condition with foot ulcer: Secondary | ICD-10-CM | POA: Diagnosis not present

## 2020-03-10 DIAGNOSIS — E11621 Type 2 diabetes mellitus with foot ulcer: Secondary | ICD-10-CM | POA: Diagnosis not present

## 2020-03-10 DIAGNOSIS — E1142 Type 2 diabetes mellitus with diabetic polyneuropathy: Secondary | ICD-10-CM | POA: Diagnosis not present

## 2020-03-10 DIAGNOSIS — Z89421 Acquired absence of other right toe(s): Secondary | ICD-10-CM | POA: Diagnosis not present

## 2020-03-10 DIAGNOSIS — L97511 Non-pressure chronic ulcer of other part of right foot limited to breakdown of skin: Secondary | ICD-10-CM | POA: Diagnosis not present

## 2020-03-10 DIAGNOSIS — S78112A Complete traumatic amputation at level between left hip and knee, initial encounter: Secondary | ICD-10-CM | POA: Diagnosis not present

## 2020-05-13 DIAGNOSIS — L308 Other specified dermatitis: Secondary | ICD-10-CM | POA: Diagnosis not present

## 2020-05-13 DIAGNOSIS — L570 Actinic keratosis: Secondary | ICD-10-CM | POA: Diagnosis not present

## 2020-05-13 DIAGNOSIS — L820 Inflamed seborrheic keratosis: Secondary | ICD-10-CM | POA: Diagnosis not present

## 2020-06-16 DIAGNOSIS — L308 Other specified dermatitis: Secondary | ICD-10-CM | POA: Diagnosis not present

## 2020-06-16 DIAGNOSIS — L298 Other pruritus: Secondary | ICD-10-CM | POA: Diagnosis not present

## 2020-06-16 DIAGNOSIS — L570 Actinic keratosis: Secondary | ICD-10-CM | POA: Diagnosis not present

## 2021-02-11 ENCOUNTER — Telehealth: Payer: Self-pay

## 2021-02-11 NOTE — Telephone Encounter (Signed)
Attempted to contact patient's daughter Oscar La to schedule a Palliative Care consult appointment. No answer left a message to return call.

## 2021-02-22 ENCOUNTER — Telehealth: Payer: Self-pay

## 2021-02-22 NOTE — Telephone Encounter (Signed)
Spoke with patient's daughter Catha Nottingham regarding scheduling a Palliative Care consult. Patient has been admitted to [facility] since 02/18/21. Will e-mail hospital liaison to follow for disposition.

## 2021-03-24 ENCOUNTER — Non-Acute Institutional Stay: Payer: Medicare (Managed Care)

## 2021-03-24 ENCOUNTER — Other Ambulatory Visit: Payer: Self-pay

## 2021-03-24 DIAGNOSIS — Z515 Encounter for palliative care: Secondary | ICD-10-CM

## 2021-03-24 NOTE — Progress Notes (Signed)
PATIENT NAME: MAYAR PIROLLI DOB: Jun 08, 1934 MRN: [id number]  PRIMARY CARE PROVIDER: Leota Jacobsen, MD  RESPONSIBLE PARTY:  Acct ID - Guarantor Home Phone Work Phone Relationship Acct Type  [id number] GIANI, [phone]  Self P/F     [address], [place], [place] [address]    PLAN OF CARE and INTERVENTIONS:               1.  GOALS OF CARE/ ADVANCE CARE PLANNING:  Patient desires to return home once therapy is completed and bilary drain is removed.  Currently a DNR and MOST form is in place with Limited Interventions.               2. EDUCATION:  Patient requested information on the [employer] oxygen.  Information provided to patient.                4. PERSONAL EMERGENCY PLAN:  Activate 911 for emergencies.               5.  DISEASE STATUS:  Joint visit completed with [place], SW.   Patient found sitting on the edge of his bed.  Patient just completed a therapy session.  He is independent with dressing, feeding and hygiene but requires 1 person assistance with transfers.  Patient has a left above the knee amputation and has a motorized wheelchair at home.  Patient lives in the basement of his home and granddaughter lives upstairs with SO.  Uncertain how much assistance family would be able to provide patient once he is home.   Patient was admitted to [facility] 02/18/21 with acute PE and started on Eliquis.  Patient found to have gangrenous cholecystitis.  ERCP attempted but unsuccessful and bilary drain was placed.  Bilary drain remains in place at the time of this visit.   Spoke with [name], SW at facility and at this time patient is a short term rehab patient.  This may change depending on patient's progress with both nursing and therapy.    HISTORY OF PRESENT ILLNESS:  87 year old male with a history of DM, Hypoxia and Acute PE.   CODE STATUS: DNR ADVANCED DIRECTIVES: No MOST FORM: Yes PPS: 40%   PHYSICAL EXAM:   LUNGS: clear to auscultation  CARDIAC: Cor RRR}  EXTREMITIES:  - edema  SKIN: Skin color, texture, turgor normal. No rashes or lesions or erythema - buttock(s) bilateral and excoriation - buttock(s) bilateral  NEURO: positive for gait problems       [name], RN

## 2021-03-24 NOTE — Progress Notes (Signed)
COMMUNITY PALLIATIVE CARE SW NOTE  PATIENT NAME: William Buchanan DOB: 1934/02/29 MRN: [id number]  PRIMARY CARE PROVIDER: Leota Jacobsen, MD  RESPONSIBLE PARTY:  Acct ID - Guarantor Home Phone Work Phone Relationship Acct Type  [id number] NELLIE, [phone]  Self P/F     [address] [place], [place] [address]     PLAN OF CARE and INTERVENTIONS:       SKILLED NURSING NOTE       GOALS OF CARE/ ADVANCE CARE PLANNING:  Patient is a DNR. MOST completed by facility. Patient's goal is to discharge back to home after rehab.   2.         SOCIAL/EMOTIONAL/SPIRITUAL ASSESSMENT/ INTERVENTIONS:  SW and RN conducted facility visit with patient. Patient had a hospital stay and discharged to [facility] under palliative care for rehab.   Patient now residing on short term rehab hall - room 3222. Therapy leaving at time of PC arrival. Therapy shares that patient is MIN- MOD A with transfers, no walking has been started. Patient has a prosthesis leg. Patient overall making slow progress. Patient found sitting on side of bed upon entrance to room. Patient appears well-developed for age. Patient alert x3 to person, place and time, although does wander off topic sporadically during conversation and has to be re-directed. Patient is not any pain or discomfort at this time but shares that he often has pain to his joints and to a small area on his back side that is red in color - above the tail bone.   Patient shares that is plan is to DC back to his home, where his granddaughter and her spouse lives upstairs and he resides in the basement. Patient shares that the basement is set up to be full living quarters and he has a ramp that goes from the basement around to the front of the driveway. Patient has a motorized WC but wishes to purchase a new one. Patient is O2 dependent. Patient has tube in place for bile leak and is aware that he can not DC back to home with tube in place. Unsure of next f/u to  discuss further plans.   SW and PC discussed goals of care with facility SW's - [name] and [place]. Patient is anticipated to be short term, DC date unknown at this time also unsure of f/u on bile tube.    Palliative care will continue to monitor and provide support as needed.   3.         PATIENT/CAREGIVER EDUCATION/ COPING:  Patient is oriented to place or time. Patient shares that he wishes to be able to shoot his gun again in his back yard. Facility is not hosting any activities currently due to recent [place] outbreak.   4.         PERSONAL EMERGENCY PLAN:  Follow facility protocols    5.         COMMUNITY RESOURCES COORDINATION/ HEALTH CARE NAVIGATION:  None   6.         FINANCIAL/LEGAL CONCERNS/INTERVENTIONS:  None.      SOCIAL HX:  Social History   Tobacco Use   Smoking status: Never   Smokeless tobacco: Never  Substance Use Topics   Alcohol use: Not on file    CODE STATUS: DNR  ADVANCED DIRECTIVES: N MOST FORM COMPLETE:  Y HOSPICE EDUCATION PROVIDED: N  PPS: atient is MIN- MOD A with transfers, no walking has been started.Patient alert x3 to person, place and time.  Time spent: 30 min      Leslie, [place]

## 2021-03-30 ENCOUNTER — Other Ambulatory Visit: Payer: Self-pay

## 2021-03-30 ENCOUNTER — Emergency Department (HOSPITAL_COMMUNITY): Payer: Medicare (Managed Care)

## 2021-03-30 ENCOUNTER — Encounter (HOSPITAL_COMMUNITY): Payer: Self-pay

## 2021-03-30 ENCOUNTER — Inpatient Hospital Stay (HOSPITAL_COMMUNITY)
Admission: EM | Admit: 2021-03-30 | Discharge: 2021-04-08 | DRG: 919 | Disposition: A | Discharge status: Skilled Nursing Facility | Payer: Medicare (Managed Care) | Source: Skilled Nursing Facility | Attending: Internal Medicine | Admitting: Internal Medicine

## 2021-03-30 DIAGNOSIS — I10 Essential (primary) hypertension: Secondary | ICD-10-CM | POA: Diagnosis present

## 2021-03-30 DIAGNOSIS — Z7901 Long term (current) use of anticoagulants: Secondary | ICD-10-CM

## 2021-03-30 DIAGNOSIS — Z880 Allergy status to penicillin: Secondary | ICD-10-CM

## 2021-03-30 DIAGNOSIS — D72829 Elevated white blood cell count, unspecified: Secondary | ICD-10-CM

## 2021-03-30 DIAGNOSIS — T85520A Displacement of bile duct prosthesis, initial encounter: Secondary | ICD-10-CM

## 2021-03-30 DIAGNOSIS — T8579XA Infection and inflammatory reaction due to other internal prosthetic devices, implants and grafts, initial encounter: Secondary | ICD-10-CM | POA: Diagnosis not present

## 2021-03-30 DIAGNOSIS — Z7902 Long term (current) use of antithrombotics/antiplatelets: Secondary | ICD-10-CM

## 2021-03-30 DIAGNOSIS — E1142 Type 2 diabetes mellitus with diabetic polyneuropathy: Secondary | ICD-10-CM | POA: Diagnosis present

## 2021-03-30 DIAGNOSIS — N400 Enlarged prostate without lower urinary tract symptoms: Secondary | ICD-10-CM | POA: Diagnosis present

## 2021-03-30 DIAGNOSIS — J449 Chronic obstructive pulmonary disease, unspecified: Secondary | ICD-10-CM

## 2021-03-30 DIAGNOSIS — G8929 Other chronic pain: Secondary | ICD-10-CM | POA: Diagnosis present

## 2021-03-30 DIAGNOSIS — R7401 Elevation of levels of liver transaminase levels: Secondary | ICD-10-CM | POA: Diagnosis present

## 2021-03-30 DIAGNOSIS — Z993 Dependence on wheelchair: Secondary | ICD-10-CM

## 2021-03-30 DIAGNOSIS — I252 Old myocardial infarction: Secondary | ICD-10-CM

## 2021-03-30 DIAGNOSIS — Z7984 Long term (current) use of oral hypoglycemic drugs: Secondary | ICD-10-CM

## 2021-03-30 DIAGNOSIS — A419 Sepsis, unspecified organism: Secondary | ICD-10-CM | POA: Diagnosis present

## 2021-03-30 DIAGNOSIS — W050XXA Fall from non-moving wheelchair, initial encounter: Secondary | ICD-10-CM

## 2021-03-30 DIAGNOSIS — Z87891 Personal history of nicotine dependence: Secondary | ICD-10-CM

## 2021-03-30 DIAGNOSIS — Z89612 Acquired absence of left leg above knee: Secondary | ICD-10-CM

## 2021-03-30 DIAGNOSIS — R1084 Generalized abdominal pain: Secondary | ICD-10-CM | POA: Diagnosis not present

## 2021-03-30 DIAGNOSIS — Z79899 Other long term (current) drug therapy: Secondary | ICD-10-CM

## 2021-03-30 DIAGNOSIS — K659 Peritonitis, unspecified: Secondary | ICD-10-CM | POA: Diagnosis present

## 2021-03-30 DIAGNOSIS — Z9049 Acquired absence of other specified parts of digestive tract: Secondary | ICD-10-CM

## 2021-03-30 DIAGNOSIS — R2689 Other abnormalities of gait and mobility: Secondary | ICD-10-CM | POA: Diagnosis present

## 2021-03-30 DIAGNOSIS — R109 Unspecified abdominal pain: Secondary | ICD-10-CM | POA: Diagnosis present

## 2021-03-30 DIAGNOSIS — E1165 Type 2 diabetes mellitus with hyperglycemia: Secondary | ICD-10-CM | POA: Diagnosis present

## 2021-03-30 DIAGNOSIS — K219 Gastro-esophageal reflux disease without esophagitis: Secondary | ICD-10-CM | POA: Diagnosis present

## 2021-03-30 DIAGNOSIS — E876 Hypokalemia: Secondary | ICD-10-CM | POA: Diagnosis not present

## 2021-03-30 DIAGNOSIS — J439 Emphysema, unspecified: Secondary | ICD-10-CM | POA: Diagnosis present

## 2021-03-30 DIAGNOSIS — Z888 Allergy status to other drugs, medicaments and biological substances status: Secondary | ICD-10-CM

## 2021-03-30 DIAGNOSIS — Z20822 Contact with and (suspected) exposure to covid-19: Secondary | ICD-10-CM | POA: Diagnosis present

## 2021-03-30 DIAGNOSIS — J9611 Chronic respiratory failure with hypoxia: Secondary | ICD-10-CM | POA: Diagnosis present

## 2021-03-30 DIAGNOSIS — E43 Unspecified severe protein-calorie malnutrition: Secondary | ICD-10-CM | POA: Diagnosis present

## 2021-03-30 DIAGNOSIS — I2699 Other pulmonary embolism without acute cor pulmonale: Secondary | ICD-10-CM

## 2021-03-30 DIAGNOSIS — E1151 Type 2 diabetes mellitus with diabetic peripheral angiopathy without gangrene: Secondary | ICD-10-CM | POA: Diagnosis present

## 2021-03-30 DIAGNOSIS — Z881 Allergy status to other antibiotic agents status: Secondary | ICD-10-CM

## 2021-03-30 DIAGNOSIS — Z86711 Personal history of pulmonary embolism: Secondary | ICD-10-CM

## 2021-03-30 DIAGNOSIS — E875 Hyperkalemia: Secondary | ICD-10-CM

## 2021-03-30 LAB — CBC WITH DIFFERENTIAL/PLATELET
Abs Immature Granulocytes: 0.08 10*3/uL — ABNORMAL HIGH (ref 0.00–0.07)
Basophils Absolute: 0 10*3/uL (ref 0.0–0.1)
Basophils Relative: 0 %
Eosinophils Absolute: 0 10*3/uL (ref 0.0–0.5)
Eosinophils Relative: 0 %
HCT: 43.4 % (ref 39.0–52.0)
Hemoglobin: 13.5 g/dL (ref 13.0–17.0)
Immature Granulocytes: 0 %
Lymphocytes Relative: 5 %
Lymphs Abs: 1 10*3/uL (ref 0.7–4.0)
MCH: 32.2 pg (ref 26.0–34.0)
MCHC: 31.1 g/dL (ref 30.0–36.0)
MCV: 103.6 fL — ABNORMAL HIGH (ref 80.0–100.0)
Monocytes Absolute: 0.9 10*3/uL (ref 0.1–1.0)
Monocytes Relative: 5 %
Neutro Abs: 15.8 10*3/uL — ABNORMAL HIGH (ref 1.7–7.7)
Neutrophils Relative %: 90 %
Platelets: 279 10*3/uL (ref 150–400)
RBC: 4.19 MIL/uL — ABNORMAL LOW (ref 4.22–5.81)
RDW: 18.6 % — ABNORMAL HIGH (ref 11.5–15.5)
WBC: 17.9 10*3/uL — ABNORMAL HIGH (ref 4.0–10.5)
nRBC: 0 % (ref 0.0–0.2)

## 2021-03-30 LAB — COMPREHENSIVE METABOLIC PANEL
ALT: 21 U/L (ref 0–44)
AST: 54 U/L — ABNORMAL HIGH (ref 15–41)
Albumin: 2.9 g/dL — ABNORMAL LOW (ref 3.5–5.0)
Alkaline Phosphatase: 130 U/L — ABNORMAL HIGH (ref 38–126)
Anion gap: 8 (ref 5–15)
BUN: 13 mg/dL (ref 8–23)
CO2: 20 mmol/L — ABNORMAL LOW (ref 22–32)
Calcium: 9.4 mg/dL (ref 8.9–10.3)
Chloride: 108 mmol/L (ref 98–111)
Creatinine, Ser: 0.84 mg/dL (ref 0.61–1.24)
GFR, Estimated: >60 mL/min (ref >60)
Glucose, Bld: 132 mg/dL — ABNORMAL HIGH (ref 70–99)
Potassium: 5.6 mmol/L — ABNORMAL HIGH (ref 3.5–5.1)
Sodium: 136 mmol/L (ref 135–145)
Total Bilirubin: 1.5 mg/dL — ABNORMAL HIGH (ref 0.3–1.2)
Total Protein: 6.2 g/dL — ABNORMAL LOW (ref 6.5–8.1)

## 2021-03-30 LAB — LIPASE, BLOOD: Lipase: 39 U/L (ref 11–51)

## 2021-03-30 MED ORDER — ONDANSETRON HCL 4 MG/2ML IJ SOLN
4.0000 mg | Freq: Four times a day (QID) | INTRAMUSCULAR | Status: DC | PRN
  Administered 2021-03-30 – 2021-04-06 (×2): 4 mg via INTRAVENOUS
  Filled 2021-03-30 (×2): qty 2

## 2021-03-30 MED ORDER — PIPERACILLIN-TAZOBACTAM 3.375 G IVPB 30 MIN
3.3750 g | Freq: Once | INTRAVENOUS | Status: AC
  Administered 2021-03-30: 3.375 g via INTRAVENOUS
  Filled 2021-03-30: qty 50

## 2021-03-30 MED ORDER — HYDROMORPHONE HCL 1 MG/ML IJ SOLN
0.5000 mg | INTRAMUSCULAR | Status: AC | PRN
  Administered 2021-03-30 – 2021-03-31 (×4): 0.5 mg via INTRAVENOUS
  Filled 2021-03-30: qty 1
  Filled 2021-03-30 (×3): qty 0.5

## 2021-03-30 MED ORDER — PANTOPRAZOLE SODIUM 40 MG PO TBEC
40.0000 mg | DELAYED_RELEASE_TABLET | Freq: Every day | ORAL | Status: DC
  Administered 2021-04-01 – 2021-04-08 (×8): 40 mg via ORAL
  Filled 2021-03-30 (×6): qty 1

## 2021-03-30 MED ORDER — ONDANSETRON HCL 4 MG/2ML IJ SOLN
4.0000 mg | Freq: Once | INTRAMUSCULAR | Status: AC
  Administered 2021-03-30: 4 mg via INTRAVENOUS
  Filled 2021-03-30: qty 2

## 2021-03-30 MED ORDER — SENNOSIDES-DOCUSATE SODIUM 8.6-50 MG PO TABS: 1.0000 | ORAL_TABLET | Freq: Every evening | ORAL | Status: DC | PRN

## 2021-03-30 MED ORDER — FENTANYL CITRATE PF 50 MCG/ML IJ SOSY
50.0000 ug | PREFILLED_SYRINGE | Freq: Once | INTRAMUSCULAR | Status: AC
  Administered 2021-03-30: 50 ug via INTRAVENOUS
  Filled 2021-03-30: qty 1

## 2021-03-30 MED ORDER — TAMSULOSIN HCL 0.4 MG PO CAPS
0.4000 mg | ORAL_CAPSULE | Freq: Every day | ORAL | Status: DC
  Administered 2021-04-01 – 2021-04-08 (×8): 0.4 mg via ORAL
  Filled 2021-03-30 (×8): qty 1

## 2021-03-30 MED ORDER — PIPERACILLIN-TAZOBACTAM 3.375 G IVPB
3.3750 g | Freq: Three times a day (TID) | INTRAVENOUS | Status: DC
  Administered 2021-03-31 – 2021-04-05 (×16): 3.375 g via INTRAVENOUS
  Filled 2021-03-30 (×16): qty 50

## 2021-03-30 MED ORDER — ONDANSETRON HCL 4 MG PO TABS: 4.0000 mg | ORAL_TABLET | Freq: Four times a day (QID) | ORAL | Status: DC | PRN

## 2021-03-30 MED ORDER — IPRATROPIUM-ALBUTEROL 0.5-2.5 (3) MG/3ML IN SOLN
3.0000 mL | Freq: Four times a day (QID) | RESPIRATORY_TRACT | Status: DC | PRN
  Administered 2021-04-05: 3 mL via RESPIRATORY_TRACT

## 2021-03-30 MED ORDER — LISINOPRIL-HYDROCHLOROTHIAZIDE 20-25 MG PO TABS: 1.0000 | ORAL_TABLET | Freq: Every day | ORAL | Status: DC

## 2021-03-30 MED ORDER — MORPHINE SULFATE (PF) 4 MG/ML IV SOLN
4.0000 mg | Freq: Once | INTRAVENOUS | Status: DC
Start: 2021-03-30 — End: 2021-03-30

## 2021-03-30 MED ORDER — SODIUM ZIRCONIUM CYCLOSILICATE 5 G PO PACK: 5.0000 g | PACK | Freq: Once | ORAL | Status: DC

## 2021-03-30 MED ORDER — ACETAMINOPHEN 325 MG PO TABS
650.0000 mg | ORAL_TABLET | Freq: Four times a day (QID) | ORAL | Status: DC | PRN
  Administered 2021-03-31 – 2021-04-08 (×12): 650 mg via ORAL
  Filled 2021-03-30 (×13): qty 2

## 2021-03-30 MED ORDER — LACTATED RINGERS IV BOLUS
1000.0000 mL | Freq: Once | INTRAVENOUS | Status: AC
  Administered 2021-03-30: 1000 mL via INTRAVENOUS

## 2021-03-30 MED ORDER — PIOGLITAZONE HCL 30 MG PO TABS: 30.0000 mg | ORAL_TABLET | Freq: Every day | ORAL | Status: DC

## 2021-03-30 MED ORDER — LACTATED RINGERS IV SOLN: INTRAVENOUS | Status: DC

## 2021-03-30 MED ORDER — ACETAMINOPHEN 650 MG RE SUPP: 650.0000 mg | Freq: Four times a day (QID) | RECTAL | Status: DC | PRN

## 2021-03-30 NOTE — ED Notes (Signed)
Meal tray given to pt. Pt npo after midnight

## 2021-03-30 NOTE — Progress Notes (Signed)
A consult was received from an ED physician for zosyn per pharmacy dosing.  The patient's profile has been reviewed for ht/wt/allergies/indication/available labs.   A one time order has been placed for zosyn 3.375 mg.    Further antibiotics/pharmacy consults should be ordered by admitting physician if indicated.                       Thank you, Eudelia Bunch, Pharm.D [phone] 03/30/2021 8:54 PM

## 2021-03-30 NOTE — ED Notes (Signed)
2 unsuccessful IV starts

## 2021-03-30 NOTE — ED Triage Notes (Signed)
Patient BIB [facility] for [facility] for tube movement. Patient had cholecystectomy 3 weeks ago. EMS reported that patient's gallbladder was gangrenous. Biliary tube placed on right side at time of surgery. Patient pulled tube out about 6 inches. Patient c/o pain in right side and phantom pain on L AKA

## 2021-03-30 NOTE — H&P (Signed)
History and Physical    William Buchanan [id number] DOB: January 07, 1934 DOA: 03/30/2021  PCP: Leota Jacobsen, MD   Patient coming from: SNF  Chief Complaint: Abdominal pain after fall  HPI: William Buchanan is a 87 y.o. male with medical history significant for HTN, DMT2 with peripheral neuropathy and vascular disease, COPD, BPH, s/p left AKA who was recently admitted to [facility] for severe sepsis and gangrenous cholecystitis.  He underwent a cholecystectomy and had a percutaneous biliary drain placed and he was discharged to a SNF.  This afternoon while he was in his wheelchair he bumped something and lost his balance and fell and when he did he landed on his abdomen and the percutaneous biliary drain became dislodged and pulled out part way.  Of upper abdominal pain was sent to the emergency room for evaluation.  CT scan reveals at the biliary drain is dislodged and now in the right lobe of the liver and not the gallbladder fossa where it was initially placed.  He is not have any fever.  He had no head injury or loss of consciousness.  He denies any other injuries or traumas.  He has chronic pain in the left AKA stump which is unchanged.  He is able to move his right leg and upper extremities normally.   ED Course: Mr. William Buchanan has mild tachycardia of 105-115 bpm but is otherwise hemodynamically stable.  He is on chronic oxygen by nasal cannula.  Found to have an elevated white blood cell count and mild hyperkalemia.  ER physician called [facility] but they did not have any beds available for the foreseeable next few days and recommended he stay here if interventional radiology was able to replace his percutaneous drain tomorrow.  ER physician discussed with interventional radiology and they will be happy to see patient in morning and replace the percutaneous biliary drain.  Hospitalist service been asked to evaluate and manage patient overnight.  Patient's been given Zosyn for empiric  antibiotic coverage.  Review of Systems:  General: Denies fever, chills, weight loss, night sweats.  Denies dizziness. Denies change in appetite HENT: Denies head trauma, headache, denies change in hearing, tinnitus. Denies nasal congestion.  Denies sore throat,  Denies difficulty swallowing Eyes: Denies blurry vision, pain in eye, drainage.  Denies discoloration of eyes. Neck: Denies pain.  Denies swelling.  Denies pain with movement. Cardiovascular: Denies chest pain, palpitations. Denies edema.  Denies orthopnea Respiratory: Denies shortness of breath, cough. Denies wheezing.  Denies sputum production Gastrointestinal: Reports abdominal pain.  Denies nausea, vomiting, diarrhea.  Denies melena.  Denies hematemesis. Musculoskeletal: Denies limitation of movement. Reports chronic pain in left AKA stump Genitourinary: Denies pelvic pain.  Denies urinary frequency or hesitancy.  Denies dysuria.  Skin: Denies rash.  Denies petechiae, purpura, ecchymosis. Neurological: Denies syncope. Denies seizure activity. Denies paresthesia. Denies slurred speech, drooping face.  Psychiatric: Denies depression, anxiety. Denies hallucinations.  Past Medical History:  Diagnosis Date   Arthritis    Cancer ([place])    Complication of anesthesia    Diabetes mellitus without complication ([place])    GERD (gastroesophageal reflux disease)    Hypertension    Myocardial infarction ([place])    Peripheral vascular disease ([place])     Past Surgical History:  Procedure Laterality Date   LEG AMPUTATION ABOVE KNEE Left     Social History  reports that he quit smoking about 32 years ago. His smoking use included cigarettes. He has never used smokeless tobacco. He reports  that he does not currently use alcohol. He reports that he does not currently use drugs.  Allergies  Allergen Reactions   Anesthetics, Amide     Made me crazy and hard to handle.    History reviewed. No pertinent family history.   Prior to  Admission medications   Medication Sig Start Date End Date Taking? Authorizing Provider  acetaminophen (TYLENOL) 500 MG tablet Take 500 mg by mouth every 4 (four) hours as needed for moderate pain.    [provider]  albuterol (PROVENTIL HFA;VENTOLIN HFA) 108 (90 Base) MCG/ACT inhaler Inhale into the lungs every 6 (six) hours as needed for wheezing or shortness of breath.    [provider]  aspirin EC 81 MG tablet Take 81 mg by mouth daily.    [provider]  clopidogrel (PLAVIX) 75 MG tablet Take 75 mg by mouth daily.    [provider]  diclofenac sodium (VOLTAREN) 1 % GEL Apply 2 g topically 4 (four) times daily. 09/27/17   Deloria Lair, NP  gabapentin (NEURONTIN) 100 MG capsule Take 100 mg by mouth as directed. Days 1-3: 1 cap in am, 1 cap in pm and 3 at hs, Days 4-6: 2 caps in am, 2 in pm and 3 at hs, Days 7-9: 3 caps tid #63, no refills.    Deloria Lair, NP  glipiZIDE (GLUCOTROL) 10 MG tablet Take 5 mg by mouth.  09/02/14   [provider]  lisinopril-hydrochlorothiazide (PRINZIDE,ZESTORETIC) 20-25 MG per tablet  09/13/14   [provider]  pantoprazole (PROTONIX) 40 MG tablet Take 40 mg by mouth daily.    [provider]  pioglitazone (ACTOS) 30 MG tablet  08/25/14   [provider]  polyethylene glycol (MIRALAX / GLYCOLAX) packet Take 17 g by mouth daily.    [provider]  simvastatin (ZOCOR) 20 MG tablet  09/13/14   [provider]  tamsulosin (FLOMAX) 0.4 MG CAPS capsule Take 0.4 mg by mouth daily. 03/27/21   [provider]  vitamin B-12 (CYANOCOBALAMIN) 250 MCG tablet Take 250 mcg by mouth daily.    [provider]  vitamin C (ASCORBIC ACID) 500 MG tablet Take 500 mg by mouth daily.    [provider]    Physical Exam: Vitals:   03/30/21 1841 03/30/21 1900 03/30/21 2000 03/30/21 2030  BP: 118/72   120/74  Pulse: (!) 116 (!) 112 (!) 114 (!) 109  Resp: 18   18   Temp:      TempSrc:      SpO2: 94% 98% 100% 100%  Weight:      Height:        Constitutional: NAD, calm, comfortable Vitals:   03/30/21 1841 03/30/21 1900 03/30/21 2000 03/30/21 2030  BP: 118/72   120/74  Pulse: (!) 116 (!) 112 (!) 114 (!) 109  Resp: 18   18  Temp:      TempSrc:      SpO2: 94% 98% 100% 100%  Weight:      Height:       General: WDWN, Alert and oriented x3  Eyes: EOMI, PERRL, conjunctivae normal. Sclera nonicteric HENT:  [place]/AT, external ears normal.  Nares patent without epistasis.  Mucous membranes are moist.  Neck: Soft, normal range of motion, supple, no masses, Trachea midline Respiratory: Equal breath sounds with diffuse rales, no wheezing, no crackles. Normal respiratory effort. No accessory muscle use.  Cardiovascular: Regular rate and rhythm, no murmurs / rubs / gallops. No extremity  edema in right leg Abdomen: Soft, Upper abdominal tenderness, nondistended, no rebound or guarding.  No masses palpated.  Bowel sounds normoactive. Right lateral upper abdomen with percutaneous drain with mild surrounding redness.  Musculoskeletal: FROM. No cyanosis. Left AKA.  Normal muscle tone.  Skin: Warm, dry, intact no rashes, lesions, ulcers. No induration Neurologic: CN 2-12 grossly intact.  Normal speech.  Sensation intact   Psychiatric: Normal mood.    Labs on Admission: I have personally reviewed following labs and imaging studies  CBC: Recent Labs  Lab 03/30/21 1626  WBC 17.9*  NEUTROABS 15.8*  HGB 13.5  HCT 43.4  MCV 103.6*  PLT 123XX123    Basic Metabolic Panel: Recent Labs  Lab 03/30/21 1740  NA 136  K 5.6*  CL 108  CO2 20*  GLUCOSE 132*  BUN 13  CREATININE 0.84  CALCIUM 9.4    GFR: Estimated Creatinine Clearance: 60.9 mL/min (by C-G formula based on SCr of 0.84 mg/dL).  Liver Function Tests: Recent Labs  Lab 03/30/21 1740  AST 54*  ALT 21  ALKPHOS 130*  BILITOT 1.5*  PROT 6.2*  ALBUMIN 2.9*    Urine analysis: No results found  for: COLORURINE, APPEARANCEUR, LABSPEC, PHURINE, GLUCOSEU, HGBUR, BILIRUBINUR, KETONESUR, PROTEINUR, UROBILINOGEN, NITRITE, LEUKOCYTESUR  Radiological Exams on Admission: CT ABDOMEN PELVIS WO CONTRAST  Result Date: 03/30/2021 CLINICAL DATA:  Status post cholecystectomy 3 weeks ago with subsequent removal of biliary tube by the patient which was placed at the time of surgery. EXAM: CT ABDOMEN AND PELVIS WITHOUT CONTRAST TECHNIQUE: Multidetector CT imaging of the abdomen and pelvis was performed following the standard protocol without IV contrast. COMPARISON:  February 22, 2021 FINDINGS: Lower chest: Marked severity chronic interstitial lung disease and peripheral pulmonary fibrosis is noted. There is a small right pleural effusion. Hepatobiliary: No focal liver abnormality is seen. A percutaneous biliary drainage catheter is seen with its distal end noted within the right lobe of the liver, adjacent to the gallbladder fossa. Status post cholecystectomy. No biliary dilatation. Pancreas: Unremarkable. No pancreatic ductal dilatation or surrounding inflammatory changes. Spleen: Normal in size without focal abnormality. Adrenals/Urinary Tract: Adrenal glands are unremarkable. Kidneys are normal, without obstructing renal calculi, focal lesion, or hydronephrosis. A 4 mm calcification is seen along the medial aspect of the mid left kidney and may be vascular in origin. Bladder is unremarkable. Stomach/Bowel: Stomach is within normal limits. Appendix appears normal. Stool is seen throughout the colon. No evidence of bowel wall thickening, distention, or inflammatory changes. Noninflamed diverticula are seen throughout the large bowel. Vascular/Lymphatic: Aortic atherosclerosis. No enlarged abdominal or pelvic lymph nodes. Reproductive: There is mild to moderate severity prostate gland enlargement. Other: No abdominal wall hernia or abnormality. No abdominopelvic ascites. Musculoskeletal: There is grade 1 anterolisthesis of  the L5 vertebral body on S1 with marked severity multilevel degenerative changes. IMPRESSION: 1. Percutaneous biliary drainage catheter positioning, as described above. 2. Small right pleural effusion. 3. Marked severity chronic interstitial lung disease and peripheral pulmonary fibrosis. 4. Colonic diverticulosis. 5. Enlarged prostate gland. 6. Grade 1 anterolisthesis of the L5 vertebral body on S1 with marked severity multilevel degenerative changes. 7. Aortic atherosclerosis. Aortic Atherosclerosis (ICD10-I70.0). Electronically Signed   By: Virgina Norfolk M.D.   On: 03/30/2021 18:32      Assessment/Plan Principal Problem:   Abdominal pain Mr. Leadbeater is placed on Med-Surg floor for observation.  He had a fall from his wheelchair at [facility] and has abdominal pain and dislodged his percutaneous biliary catheter.  Dilaudid for  pain overnight.  CT shows no fractures of ribs or injury to internal organs, no free air or ascites IVF with LR overnight.   Active Problems:   Migration of percutaneous transhepatic biliary drain catheter IR consulted by ER physician and will see pt in am to replace the percutaneous biliary drain.    Essential hypertension Continue lisinopril.  Monitor blood pressure    Type 2 diabetes mellitus with diabetic polyneuropathy, without long-term current use of insulin Continue Actos.  Monitor blood sugars.  Glucotrol was held to prevent hypoglycemia while patient is n.p.o. overnight.  Can be resumed once resumes diet and is discharged    Leukocytosis WBC elevated at 17,000. Placed on zosyn for empiric antibiotic coverage with abdominal pain and dislodged biliary drain.  Patient has elevated heart rate but is hemodynamically stable. Check CBC in am    Hyperkalemia Given Lokelma.  Check electrolytes in morning with labs    COPD (chronic obstructive pulmonary disease) Duoneb as needed.  Supplemental oxygen to keep O2 sat between 92-96%. Incentive spirometer every 2 hours  while awake    S/P AKA (above knee amputation) unilateral, left    Fall from wheelchair   DVT prophylaxis: SCD for DVT prophylaxis. Elevated Padua score. No anticoagulation in anticipation of IR procedure in am  Code Status:   Full Code  Family Communication:  Diagnosis and plan discussed with patient.  Verbalized understanding agrees with plan.  Further recommendations to follow as clinical indicated Disposition Plan:   Patient is from:  SNF  Anticipated DC to:  SNF  Anticipated DC date:  Anticipate less than 2 midnights in hospital   Consults called:  Interventional Radiology consulted by ER physician  Admission status:  Observation   Yevonne Aline Amaranta Mehl MD [facility]  How to contact the [facility] Attending or Consulting provider [place] or covering provider during after hours [place], for this patient?   Check the care team in [facility] and look for a) attending/consulting [facility] provider listed and b) the [facility] team listed Log into [email or web address] and use [place]'s universal password to access. If you do not have the password, please contact the hospital operator. Locate the [facility] provider you are looking for under [facility] and page to a number that you can be directly reached. If you still have difficulty reaching the provider, please page the [facility] (Director on Call) for the Hospitalists listed on amion for assistance.  03/30/2021, 9:08 PM

## 2021-03-30 NOTE — ED Notes (Signed)
Patients daughter [name] would like a call back with an update [phone]

## 2021-03-30 NOTE — ED Provider Notes (Signed)
[facility] DEPT Provider Note   CSN: [id number] Arrival date & time: 03/30/21  1456     History Chief Complaint  Patient presents with   pulled billary tube out of right side    William Buchanan is a 87 y.o. male with PMH emphysema, previous pulmonary embolism, peripheral vascular disease, T2DM, left AKA recent admission on 02/23/2021 with respiratory failure secondary to sepsis secondary to necrotizing acalculous cholecystitis status post cholecystectomy and biliary drain placement who presents to the emergency department for evaluation of a dislodged biliary drain.  Patient states that he slipped out of his wheelchair today and dislodged the tube.  He endorses generalized abdominal tenderness but no associated nausea, vomiting, fever, chest pain, shortness of breath or other systemic symptoms.  HPI     Past Medical History:  Diagnosis Date   Arthritis    Cancer ([place])    Complication of anesthesia    Diabetes mellitus without complication ([place])    GERD (gastroesophageal reflux disease)    Hypertension    Myocardial infarction [facility])    Peripheral vascular disease ([place])     Patient Active Problem List   Diagnosis Date Noted   Abdominal pain 03/30/2021   Migration of percutaneous transhepatic biliary drain catheter 03/30/2021   S/P AKA (above knee amputation) unilateral, left ([place]) 03/30/2021   Fall from wheelchair 03/30/2021   Leukocytosis 03/30/2021   COPD (chronic obstructive pulmonary disease) ([place]) 03/30/2021   Hyperkalemia 03/30/2021   Critical lower limb ischemia ([place]) 06/07/2017   Severe protein-calorie malnutrition ([place]) 06/07/2017   Diverticulitis 02/18/2017   Arcus senilis, bilateral 01/10/2017   History of non-ST elevation myocardial infarction (NSTEMI) 12/26/2016   Bilateral carotid artery stenosis 08/25/2016   Benign neoplasm of colon 07/07/2015   Inflammatory polyarthritis ([place]) 07/07/2015   Type 2 diabetes mellitus with  diabetic polyneuropathy, without long-term current use of insulin ([place]) 07/07/2015   Carotid atherosclerosis 07/07/2015   Local infection of skin and subcutaneous tissue 09/15/2014   Bunion 09/15/2014   Ulcer of heel and midfoot ([place]) 09/15/2014   Essential hypertension 04/16/2014    Past Surgical History:  Procedure Laterality Date   LEG AMPUTATION ABOVE KNEE Left        History reviewed. No pertinent family history.  Social History   Tobacco Use   Smoking status: Former    Years: 20.00    Types: Cigarettes    Quit date: 1990    Years since quitting: 32.6   Smokeless tobacco: Never  Vaping Use   Vaping Use: Never used  Substance Use Topics   Alcohol use: Not Currently    Comment: occasional, 5 months since any EtOH   Drug use: Not Currently    Home Medications Prior to Admission medications   Medication Sig Start Date End Date Taking? Authorizing Provider  acetaminophen (TYLENOL) 500 MG tablet Take 500 mg by mouth every 4 (four) hours as needed for moderate pain.    [provider]  albuterol (PROVENTIL HFA;VENTOLIN HFA) 108 (90 Base) MCG/ACT inhaler Inhale into the lungs every 6 (six) hours as needed for wheezing or shortness of breath.    [provider]  aspirin EC 81 MG tablet Take 81 mg by mouth daily.    [provider]  clopidogrel (PLAVIX) 75 MG tablet Take 75 mg by mouth daily.    [provider]  diclofenac sodium (VOLTAREN) 1 % GEL Apply 2 g topically 4 (four) times daily. 09/27/17   Deloria Lair, NP  gabapentin (  NEURONTIN) 100 MG capsule Take 100 mg by mouth as directed. Days 1-3: 1 cap in am, 1 cap in pm and 3 at hs, Days 4-6: 2 caps in am, 2 in pm and 3 at hs, Days 7-9: 3 caps tid #63, no refills.    Deloria Lair, NP  glipiZIDE (GLUCOTROL) 10 MG tablet Take 5 mg by mouth.  09/02/14   [provider]  lisinopril-hydrochlorothiazide (PRINZIDE,ZESTORETIC) 20-25 MG per tablet  09/13/14   [provider]   pantoprazole (PROTONIX) 40 MG tablet Take 40 mg by mouth daily.    [provider]  pioglitazone (ACTOS) 30 MG tablet  08/25/14   [provider]  polyethylene glycol (MIRALAX / GLYCOLAX) packet Take 17 g by mouth daily.    [provider]  simvastatin (ZOCOR) 20 MG tablet  09/13/14   [provider]  tamsulosin (FLOMAX) 0.4 MG CAPS capsule Take 0.4 mg by mouth daily. 03/27/21   [provider]  vitamin B-12 (CYANOCOBALAMIN) 250 MCG tablet Take 250 mcg by mouth daily.    [provider]  vitamin C (ASCORBIC ACID) 500 MG tablet Take 500 mg by mouth daily.    [provider]    Allergies    Anesthetics, amide  Review of Systems   Review of Systems  Constitutional:  Negative for chills and fever.  HENT:  Negative for ear pain and sore throat.   Eyes:  Negative for pain and visual disturbance.  Respiratory:  Negative for cough and shortness of breath.   Cardiovascular:  Negative for chest pain and palpitations.  Gastrointestinal:  Positive for abdominal pain. Negative for vomiting.  Genitourinary:  Negative for dysuria and hematuria.  Musculoskeletal:  Negative for arthralgias and back pain.  Skin:  Negative for color change and rash.  Neurological:  Negative for seizures and syncope.  All other systems reviewed and are negative.  Physical Exam Updated Vital Signs BP 97/63   Pulse (!) 117   Temp 98.2 F (36.8 C) (Oral)   Resp 18   Ht _0  (1.753 m)   Wt 65.8 kg   SpO2 100%   BMI 21.41 kg/m   Physical Exam Vitals and nursing note reviewed.  Constitutional:      Appearance: He is well-developed.  HENT:     Head: Normocephalic and atraumatic.  Eyes:     Conjunctiva/sclera: Conjunctivae normal.  Cardiovascular:     Rate and Rhythm: Regular rhythm. Tachycardia present.     Heart sounds: No murmur heard. Pulmonary:     Effort: Pulmonary effort is normal. No respiratory distress.     Breath sounds: Normal breath  sounds.  Abdominal:     Palpations: Abdomen is soft.     Tenderness: There is abdominal tenderness.  Musculoskeletal:     Cervical back: Neck supple.  Skin:    General: Skin is warm and dry.  Neurological:     Mental Status: He is alert.    ED Results / Procedures / Treatments   Labs (all labs ordered are listed, but only abnormal results are displayed) Labs Reviewed  CBC WITH DIFFERENTIAL/PLATELET - Abnormal; Notable for the following components:      Result Value   WBC 17.9 (*)    RBC 4.19 (*)    MCV 103.6 (*)    RDW 18.6 (*)    Neutro Abs 15.8 (*)    Abs Immature Granulocytes 0.08 (*)    All other components within normal limits  COMPREHENSIVE METABOLIC PANEL -  Abnormal; Notable for the following components:   Potassium 5.6 (*)    CO2 20 (*)    Glucose, Bld 132 (*)    Total Protein 6.2 (*)    Albumin 2.9 (*)    AST 54 (*)    Alkaline Phosphatase 130 (*)    Total Bilirubin 1.5 (*)    All other components within normal limits  SARS CORONAVIRUS 2 (TAT 6-24 HRS)  LIPASE, BLOOD  BASIC METABOLIC PANEL  CBC    EKG None  Radiology CT ABDOMEN PELVIS WO CONTRAST  Result Date: 03/30/2021 CLINICAL DATA:  Status post cholecystectomy 3 weeks ago with subsequent removal of biliary tube by the patient which was placed at the time of surgery. EXAM: CT ABDOMEN AND PELVIS WITHOUT CONTRAST TECHNIQUE: Multidetector CT imaging of the abdomen and pelvis was performed following the standard protocol without IV contrast. COMPARISON:  February 22, 2021 FINDINGS: Lower chest: Marked severity chronic interstitial lung disease and peripheral pulmonary fibrosis is noted. There is a small right pleural effusion. Hepatobiliary: No focal liver abnormality is seen. A percutaneous biliary drainage catheter is seen with its distal end noted within the right lobe of the liver, adjacent to the gallbladder fossa. Status post cholecystectomy. No biliary dilatation. Pancreas: Unremarkable. No pancreatic ductal  dilatation or surrounding inflammatory changes. Spleen: Normal in size without focal abnormality. Adrenals/Urinary Tract: Adrenal glands are unremarkable. Kidneys are normal, without obstructing renal calculi, focal lesion, or hydronephrosis. A 4 mm calcification is seen along the medial aspect of the mid left kidney and may be vascular in origin. Bladder is unremarkable. Stomach/Bowel: Stomach is within normal limits. Appendix appears normal. Stool is seen throughout the colon. No evidence of bowel wall thickening, distention, or inflammatory changes. Noninflamed diverticula are seen throughout the large bowel. Vascular/Lymphatic: Aortic atherosclerosis. No enlarged abdominal or pelvic lymph nodes. Reproductive: There is mild to moderate severity prostate gland enlargement. Other: No abdominal wall hernia or abnormality. No abdominopelvic ascites. Musculoskeletal: There is grade 1 anterolisthesis of the L5 vertebral body on S1 with marked severity multilevel degenerative changes. IMPRESSION: 1. Percutaneous biliary drainage catheter positioning, as described above. 2. Small right pleural effusion. 3. Marked severity chronic interstitial lung disease and peripheral pulmonary fibrosis. 4. Colonic diverticulosis. 5. Enlarged prostate gland. 6. Grade 1 anterolisthesis of the L5 vertebral body on S1 with marked severity multilevel degenerative changes. 7. Aortic atherosclerosis. Aortic Atherosclerosis (ICD10-I70.0). Electronically Signed   By: Virgina Norfolk M.D.   On: 03/30/2021 18:32    Procedures Procedures   Medications Ordered in ED Medications  lisinopril-hydrochlorothiazide (ZESTORETIC) 20-25 MG per tablet 1 tablet (has no administration in time range)  pioglitazone (ACTOS) tablet 30 mg (has no administration in time range)  tamsulosin (FLOMAX) capsule 0.4 mg (has no administration in time range)  pantoprazole (PROTONIX) EC tablet 40 mg (has no administration in time range)  lactated ringers  infusion (has no administration in time range)  acetaminophen (TYLENOL) tablet 650 mg (has no administration in time range)    Or  acetaminophen (TYLENOL) suppository 650 mg (has no administration in time range)  HYDROmorphone (DILAUDID) injection 0.5 mg (0.5 mg Intravenous Given 03/30/21 2200)  ondansetron (ZOFRAN) tablet 4 mg ( Oral See Alternative 03/30/21 2159)    Or  ondansetron (ZOFRAN) injection 4 mg (4 mg Intravenous Given 03/30/21 2159)  senna-docusate (Senokot-S) tablet 1 tablet (has no administration in time range)  ipratropium-albuterol (DUONEB) 0.5-2.5 (3) MG/3ML nebulizer solution 3 mL (has no administration in time range)  piperacillin-tazobactam (ZOSYN) IVPB 3.375  g (has no administration in time range)  sodium zirconium cyclosilicate (LOKELMA) packet 5 g (has no administration in time range)  ondansetron (ZOFRAN) injection 4 mg (4 mg Intravenous Given 03/30/21 1837)  fentaNYL (SUBLIMAZE) injection 50 mcg (50 mcg Intravenous Given 03/30/21 1839)  lactated ringers bolus 1,000 mL (0 mLs Intravenous Stopped 03/30/21 2311)  piperacillin-tazobactam (ZOSYN) IVPB 3.375 g (0 g Intravenous Stopped 03/30/21 2137)    ED Course  I have reviewed the triage vital signs and the nursing notes.  Pertinent labs & imaging results that were available during my care of the patient were reviewed by me and considered in my medical decision making (see chart for details).    MDM Rules/Calculators/A&P                           Patient seen the emergency department for evaluation of abdominal pain and a dislodged biliary drain.  Physical exam reveals generalized abdominal tenderness but is otherwise unremarkable.  Laboratory evaluation with a significant leukocytosis to 17.9, hyperkalemia 5.6, mild transaminitis with AST 54, total bili elevated to the 1.5, alk phos 130.  CT [place] shows a dislodged biliary drain in the right hepatic lobe.  I attempted to transfer the patient to [facility] as  these providers initially placed his biliary drain, but there is currently a 5-day wait and I am concerned about patient decompensation while waiting for this.  I consulted interventional radiology who states that they will replace the biliary drain tomorrow and the patient was admitted to medicine, placed on Zosyn Final Clinical Impression(s) / ED Diagnoses Final diagnoses:  None    Rx / DC Orders ED Discharge Orders     None        Marquite Attwood, Debe Coder, MD 03/30/21 [phone]

## 2021-03-30 NOTE — ED Notes (Signed)
MD at bedside. 

## 2021-03-31 ENCOUNTER — Inpatient Hospital Stay (HOSPITAL_COMMUNITY): Payer: Medicare (Managed Care)

## 2021-03-31 ENCOUNTER — Encounter (HOSPITAL_COMMUNITY): Payer: Self-pay | Admitting: Family Medicine

## 2021-03-31 DIAGNOSIS — K219 Gastro-esophageal reflux disease without esophagitis: Secondary | ICD-10-CM | POA: Diagnosis present

## 2021-03-31 DIAGNOSIS — Z20822 Contact with and (suspected) exposure to covid-19: Secondary | ICD-10-CM | POA: Diagnosis present

## 2021-03-31 DIAGNOSIS — E1142 Type 2 diabetes mellitus with diabetic polyneuropathy: Secondary | ICD-10-CM | POA: Diagnosis present

## 2021-03-31 DIAGNOSIS — T8579XA Infection and inflammatory reaction due to other internal prosthetic devices, implants and grafts, initial encounter: Secondary | ICD-10-CM | POA: Diagnosis present

## 2021-03-31 DIAGNOSIS — I10 Essential (primary) hypertension: Secondary | ICD-10-CM | POA: Diagnosis present

## 2021-03-31 DIAGNOSIS — Z87891 Personal history of nicotine dependence: Secondary | ICD-10-CM | POA: Diagnosis not present

## 2021-03-31 DIAGNOSIS — J449 Chronic obstructive pulmonary disease, unspecified: Secondary | ICD-10-CM | POA: Diagnosis not present

## 2021-03-31 DIAGNOSIS — G8929 Other chronic pain: Secondary | ICD-10-CM | POA: Diagnosis present

## 2021-03-31 DIAGNOSIS — Z9049 Acquired absence of other specified parts of digestive tract: Secondary | ICD-10-CM | POA: Diagnosis not present

## 2021-03-31 DIAGNOSIS — R1084 Generalized abdominal pain: Secondary | ICD-10-CM | POA: Diagnosis not present

## 2021-03-31 DIAGNOSIS — E875 Hyperkalemia: Secondary | ICD-10-CM | POA: Diagnosis present

## 2021-03-31 DIAGNOSIS — J439 Emphysema, unspecified: Secondary | ICD-10-CM | POA: Diagnosis present

## 2021-03-31 DIAGNOSIS — I252 Old myocardial infarction: Secondary | ICD-10-CM | POA: Diagnosis not present

## 2021-03-31 DIAGNOSIS — E1165 Type 2 diabetes mellitus with hyperglycemia: Secondary | ICD-10-CM | POA: Diagnosis present

## 2021-03-31 DIAGNOSIS — T85520A Displacement of bile duct prosthesis, initial encounter: Secondary | ICD-10-CM | POA: Diagnosis present

## 2021-03-31 DIAGNOSIS — Z7901 Long term (current) use of anticoagulants: Secondary | ICD-10-CM | POA: Diagnosis not present

## 2021-03-31 DIAGNOSIS — E1151 Type 2 diabetes mellitus with diabetic peripheral angiopathy without gangrene: Secondary | ICD-10-CM | POA: Diagnosis present

## 2021-03-31 DIAGNOSIS — Z89612 Acquired absence of left leg above knee: Secondary | ICD-10-CM | POA: Diagnosis not present

## 2021-03-31 DIAGNOSIS — Z86711 Personal history of pulmonary embolism: Secondary | ICD-10-CM | POA: Diagnosis not present

## 2021-03-31 DIAGNOSIS — W050XXA Fall from non-moving wheelchair, initial encounter: Secondary | ICD-10-CM | POA: Diagnosis present

## 2021-03-31 DIAGNOSIS — K659 Peritonitis, unspecified: Secondary | ICD-10-CM | POA: Diagnosis present

## 2021-03-31 DIAGNOSIS — J9611 Chronic respiratory failure with hypoxia: Secondary | ICD-10-CM | POA: Diagnosis present

## 2021-03-31 DIAGNOSIS — Z993 Dependence on wheelchair: Secondary | ICD-10-CM | POA: Diagnosis not present

## 2021-03-31 DIAGNOSIS — R109 Unspecified abdominal pain: Secondary | ICD-10-CM | POA: Diagnosis present

## 2021-03-31 DIAGNOSIS — D72829 Elevated white blood cell count, unspecified: Secondary | ICD-10-CM | POA: Diagnosis present

## 2021-03-31 DIAGNOSIS — A419 Sepsis, unspecified organism: Secondary | ICD-10-CM | POA: Diagnosis present

## 2021-03-31 DIAGNOSIS — E876 Hypokalemia: Secondary | ICD-10-CM | POA: Diagnosis not present

## 2021-03-31 DIAGNOSIS — R2689 Other abnormalities of gait and mobility: Secondary | ICD-10-CM | POA: Diagnosis present

## 2021-03-31 HISTORY — PX: IR EXCHANGE BILIARY DRAIN: IMG6046

## 2021-03-31 LAB — CBC
HCT: 36.3 % — ABNORMAL LOW (ref 39.0–52.0)
Hemoglobin: 11.9 g/dL — ABNORMAL LOW (ref 13.0–17.0)
MCH: 32.2 pg (ref 26.0–34.0)
MCHC: 32.8 g/dL (ref 30.0–36.0)
MCV: 98.4 fL (ref 80.0–100.0)
Platelets: 255 10*3/uL (ref 150–400)
RBC: 3.69 MIL/uL — ABNORMAL LOW (ref 4.22–5.81)
RDW: 18.5 % — ABNORMAL HIGH (ref 11.5–15.5)
WBC: 18.7 10*3/uL — ABNORMAL HIGH (ref 4.0–10.5)
nRBC: 0 % (ref 0.0–0.2)

## 2021-03-31 LAB — BASIC METABOLIC PANEL
Anion gap: 7 (ref 5–15)
BUN: 12 mg/dL (ref 8–23)
CO2: 22 mmol/L (ref 22–32)
Calcium: 9.5 mg/dL (ref 8.9–10.3)
Chloride: 106 mmol/L (ref 98–111)
Creatinine, Ser: 0.75 mg/dL (ref 0.61–1.24)
GFR, Estimated: >60 mL/min (ref >60)
Glucose, Bld: 114 mg/dL — ABNORMAL HIGH (ref 70–99)
Potassium: 4.9 mmol/L (ref 3.5–5.1)
Sodium: 135 mmol/L (ref 135–145)

## 2021-03-31 LAB — MRSA NEXT GEN BY PCR, NASAL: MRSA by PCR Next Gen: NOT DETECTED

## 2021-03-31 LAB — SARS CORONAVIRUS 2 (TAT 6-24 HRS): SARS Coronavirus 2: NEGATIVE

## 2021-03-31 MED ORDER — LIDOCAINE HCL 1 % IJ SOLN
INTRAMUSCULAR | Status: AC
  Filled 2021-03-31: qty 20

## 2021-03-31 MED ORDER — MIDAZOLAM HCL 2 MG/2ML IJ SOLN
INTRAMUSCULAR | Status: AC | PRN
  Administered 2021-03-31: 1 mg via INTRAVENOUS

## 2021-03-31 MED ORDER — FENTANYL CITRATE (PF) 100 MCG/2ML IJ SOLN
INTRAMUSCULAR | Status: AC
  Filled 2021-03-31: qty 2

## 2021-03-31 MED ORDER — GABAPENTIN 100 MG PO CAPS
100.0000 mg | ORAL_CAPSULE | Freq: Two times a day (BID) | ORAL | Status: AC
  Administered 2021-03-31 – 2021-04-01 (×3): 100 mg via ORAL
  Filled 2021-03-31 (×3): qty 1

## 2021-03-31 MED ORDER — SODIUM CHLORIDE 0.9 % IV SOLN
INTRAVENOUS | Status: DC | PRN
  Administered 2021-03-31: 500 mL via INTRAVENOUS

## 2021-03-31 MED ORDER — FENTANYL CITRATE (PF) 100 MCG/2ML IJ SOLN
INTRAMUSCULAR | Status: AC | PRN
  Administered 2021-03-31: 50 ug via INTRAVENOUS

## 2021-03-31 MED ORDER — MIDAZOLAM HCL 2 MG/2ML IJ SOLN
INTRAMUSCULAR | Status: AC
  Filled 2021-03-31: qty 4

## 2021-03-31 MED ORDER — IOHEXOL 300 MG/ML  SOLN
50.0000 mL | Freq: Once | INTRAMUSCULAR | Status: AC | PRN
  Administered 2021-03-31: 15 mL

## 2021-03-31 MED ORDER — ADULT MULTIVITAMIN W/MINERALS CH
1.0000 | ORAL_TABLET | Freq: Every day | ORAL | Status: DC
  Administered 2021-03-31 – 2021-04-08 (×9): 1 via ORAL
  Filled 2021-03-31 (×9): qty 1

## 2021-03-31 MED ORDER — LIDOCAINE-EPINEPHRINE 1 %-1:100000 IJ SOLN
INTRAMUSCULAR | Status: AC | PRN
  Administered 2021-03-31: 10 mL

## 2021-03-31 MED ORDER — PROSOURCE PLUS PO LIQD
30.0000 mL | Freq: Two times a day (BID) | ORAL | Status: DC
  Administered 2021-03-31 – 2021-04-08 (×15): 30 mL via ORAL
  Filled 2021-03-31 (×13): qty 30

## 2021-03-31 MED ORDER — BOOST / RESOURCE BREEZE PO LIQD CUSTOM
1.0000 | Freq: Three times a day (TID) | ORAL | Status: DC
  Administered 2021-03-31 – 2021-04-08 (×20): 1 via ORAL

## 2021-03-31 NOTE — ED Notes (Signed)
Patients daughter would like to have the surgeon call her at [phone] to advise her of his status. Daughters name is [name] Karna Christmas)  Daughter was unable to be there due to her own surgery currently. She will be released this Friday.

## 2021-03-31 NOTE — H&P (Signed)
Chief Complaint: Patient was seen in consultation today for image guided replacement of a dislodged biliary drain Chief Complaint  Patient presents with   pulled billary tube out of right side   at the request of Dr. Charm Barges (EDP)/ Dr. Tonie Griffith, B. [facility] admitting)  Referring Physician(s): Dr. Charm Barges (EDP)/ Dr. Tonie Griffith, B. [facility] admitting)  Supervising Physician: Jacqulynn Cadet  Patient Status: [facility] - In-pt  History of Present Illness: William Buchanan is a 87 y.o. male with PMHs of HTN, DMT2, COPD, MI, PVD on Plavix and aspirin, s/p left AKA who was recently admitted to [place] for severe sepsis and gangrenous cholecystitis s/p cholecystectomy on 4/40/1027, complicated by bile leak s/p percutaneous int/ext biliary drain placement with [facility] IR on 02/24/2021, who was brought from nursing home to [facility] on 03/30/2021 due to dislodged biliary drain. Labs in the ED revealed leukocytosis to 17.9, hyperkalemia 5.6, mild transaminitis with AST 54, T bili 1.5, alk phos 130.  Vital signs showed achycardia, stable otherwise.  Patient underwent CT abdomen pelvis which showed dislodged biliary drain in the right hepatic lobe adjacent to the gallbladder fossa.   EDP attempted to transfer patient to [facility] for further evaluation and management of the dislodged biliary drain; however, there is a 5-day wait. Patient was admitted to [facility] for further evaluation and management.   IR was requested for image guided replacement of dislodged biliary drain. Case was reviewed and approved by Dr. Laurence Ferrari, IR will attempt to replace the bili drain.   Patient laying in bed, not in acute distress.  Patient is extremely upset and yelling that he is in so much pain but has not gotten any medicines for it.  Reports right upper quadrant pain for 3 days, the pain started before the biliary drain was dislodged.  Patient repetitively states that  " nobody cares about me." Patient also states that " I needed this drain to be exchanged yesterday, I was told that it will be done today but nothing has happened." Inform the patient that IR is planning for replacing the biliary drain today; however, not able to provide the patient with definitive time.  Patient remained extremely upset throughout the assessment.  Past Medical History:  Diagnosis Date   Arthritis    Cancer ([place])    Complication of anesthesia    Diabetes mellitus without complication ([place])    GERD (gastroesophageal reflux disease)    Hypertension    Myocardial infarction ([place])    Peripheral vascular disease ([place])     Past Surgical History:  Procedure Laterality Date   LEG AMPUTATION ABOVE KNEE Left     Allergies: Anesthetics, amide; Doxycycline; Zolpidem; and Amoxicillin-pot clavulanate  Medications: Prior to Admission medications   Medication Sig Start Date End Date Taking? Authorizing Provider  acetaminophen (TYLENOL) 500 MG tablet Take 500 mg by mouth every 6 (six) hours as needed for moderate pain.   Yes [provider]  apixaban (ELIQUIS) 5 MG TABS tablet Take 1 tablet by mouth in the morning and at bedtime. MD Ordered 1 tablet BID / But according to [facility] , patient seems to only been taking, 1 TABLET DAILY. 03/10/21 06/08/21 Yes [provider]  budesonide-formoterol (SYMBICORT) 160-4.5 MCG/ACT inhaler Inhale 2 puffs into the lungs 2 (two) times daily. 01/07/21  Yes [provider]  cetirizine (ZYRTEC) 10 MG tablet Take 1 tablet by mouth daily. 01/20/21  Yes [provider]  Cholecalciferol 25 MCG (1000 UT)  tablet Take 1 tablet by mouth daily.   Yes [provider]  gabapentin (NEURONTIN) 300 MG capsule Take 100 mg by mouth 2 (two) times daily.   Yes Deloria Lair, NP  glipiZIDE (GLUCOTROL) 5 MG tablet Take 5 mg by mouth 2 (two) times daily. 09/02/14  Yes [provider]  memantine (NAMENDA) 5 MG tablet Take 5 mg  by mouth 2 (two) times daily. 03/10/21  Yes [provider]  Multiple Vitamins-Minerals (MULTIVITAMIN WITH MINERALS) tablet Take 1 tablet by mouth daily.   Yes [provider]  mupirocin ointment (BACTROBAN) 2 % APPLY THIN COAT TWICE DAILY 10/11/20  Yes [provider]  pantoprazole (PROTONIX) 40 MG tablet Take 40 mg by mouth daily.   Yes [provider]  PRESCRIPTION MEDICATION Take 120 mLs by mouth 2 (two) times daily. MED PASS Supplement, may substitute equal amounts of Health Shake if Med Pass isn't available   Yes [provider]  simvastatin (ZOCOR) 20 MG tablet Take 20 mg by mouth at bedtime. 09/13/14  Yes [provider]  tamsulosin (FLOMAX) 0.4 MG CAPS capsule Take 0.4 mg by mouth daily. 03/27/21  Yes [provider]  vitamin B-12 (CYANOCOBALAMIN) 250 MCG tablet Take 250 mcg by mouth daily.   Yes [provider]  vitamin C (ASCORBIC ACID) 500 MG tablet Take 500 mg by mouth daily.   Yes [provider]  albuterol (PROVENTIL HFA;VENTOLIN HFA) 108 (90 Base) MCG/ACT inhaler Inhale into the lungs every 6 (six) hours as needed for wheezing or shortness of breath. Patient not taking: Reported on 03/31/2021    [provider]  aspirin EC 81 MG tablet Take 81 mg by mouth daily. Patient not taking: No sig reported    [provider]  clopidogrel (PLAVIX) 75 MG tablet Take 75 mg by mouth daily. Patient not taking: Reported on 03/31/2021    [provider]  diclofenac sodium (VOLTAREN) 1 % GEL Apply 2 g topically 4 (four) times daily. Patient not taking: Reported on 03/31/2021 09/27/17   Deloria Lair, NP  lisinopril-hydrochlorothiazide (PRINZIDE,ZESTORETIC) 20-25 MG per tablet  09/13/14   [provider]  pioglitazone (ACTOS) 30 MG tablet  08/25/14   [provider]  polyethylene glycol (MIRALAX / GLYCOLAX) packet Take 17 g by mouth daily. Patient not taking: Reported on 03/31/2021     [provider]     History reviewed. No pertinent family history.  Social History   Socioeconomic History   Marital status: Widowed    Spouse name: Not on file   Number of children: Not on file   Years of education: Not on file   Highest education level: Not on file  Occupational History   Not on file  Tobacco Use   Smoking status: Former    Years: 20.00    Types: Cigarettes    Quit date: 80    Years since quitting: 32.6   Smokeless tobacco: Never  Vaping Use   Vaping Use: Never used  Substance and Sexual Activity   Alcohol use: Not Currently    Comment: occasional, 5 months since any EtOH   Drug use: Not Currently   Sexual activity: Not on file  Other Topics Concern   Not on file  Social History Narrative   Not on file   Social Determinants of Health   Financial Resource Strain: Not on file  Food Insecurity: Not on file  Transportation Needs: Not on file  Physical Activity: Not on file  Stress:  Not on file  Social Connections: Not on file     Review of Systems: A 12 point ROS discussed and pertinent positives are indicated in the HPI above.  All other systems are negative.  Vital Signs: BP 109/69   Pulse (!) 106   Temp 99.2 F (37.3 C) (Oral)   Resp 20   Ht 5' 9" (1.753 m)   Wt 145 lb (65.8 kg)   SpO2 97%   BMI 21.41 kg/m    Physical Exam Vitals reviewed.  Constitutional:      General: He is not in acute distress.    Appearance: He is ill-appearing.  HENT:     Head: Normocephalic and atraumatic.  Cardiovascular:     Rate and Rhythm: Regular rhythm. Tachycardia present.     Heart sounds: Normal heart sounds.  Pulmonary:     Breath sounds: Normal breath sounds.     Comments: Tachypnea  Abdominal:     General: Abdomen is flat. Bowel sounds are normal.     Palpations: Abdomen is soft.     Tenderness: There is abdominal tenderness.     Comments: RUQ tenderness   Musculoskeletal:     Cervical back: Neck supple.     Comments: Left  AKA   Skin:    General: Skin is warm and dry.     Coloration: Skin is not jaundiced or pale.     Comments: Positive RUQ/lateral drain to a foley bag. Site with slight erythema and tenderness. No bleeding or drainage. Dressing is clean, dry, and intact. Dark brown colored fluid noted in the bag.    Neurological:     Mental Status: He is alert and oriented to person, place, and time.  Psychiatric:        Judgment: Judgment normal.     Comments: Agitated and upset     MD Evaluation Airway: WNL Heart: WNL Abdomen: WNL Chest/ Lungs: WNL ASA  Classification: 3 Mallampati/Airway Score: Two  Imaging: CT ABDOMEN PELVIS WO CONTRAST  Result Date: 03/30/2021 CLINICAL DATA:  Status post cholecystectomy 3 weeks ago with subsequent removal of biliary tube by the patient which was placed at the time of surgery. EXAM: CT ABDOMEN AND PELVIS WITHOUT CONTRAST TECHNIQUE: Multidetector CT imaging of the abdomen and pelvis was performed following the standard protocol without IV contrast. COMPARISON:  February 22, 2021 FINDINGS: Lower chest: Marked severity chronic interstitial lung disease and peripheral pulmonary fibrosis is noted. There is a small right pleural effusion. Hepatobiliary: No focal liver abnormality is seen. A percutaneous biliary drainage catheter is seen with its distal end noted within the right lobe of the liver, adjacent to the gallbladder fossa. Status post cholecystectomy. No biliary dilatation. Pancreas: Unremarkable. No pancreatic ductal dilatation or surrounding inflammatory changes. Spleen: Normal in size without focal abnormality. Adrenals/Urinary Tract: Adrenal glands are unremarkable. Kidneys are normal, without obstructing renal calculi, focal lesion, or hydronephrosis. A 4 mm calcification is seen along the medial aspect of the mid left kidney and may be vascular in origin. Bladder is unremarkable. Stomach/Bowel: Stomach is within normal limits. Appendix appears normal. Stool is seen  throughout the colon. No evidence of bowel wall thickening, distention, or inflammatory changes. Noninflamed diverticula are seen throughout the large bowel. Vascular/Lymphatic: Aortic atherosclerosis. No enlarged abdominal or pelvic lymph nodes. Reproductive: There is mild to moderate severity prostate gland enlargement. Other: No abdominal wall hernia or abnormality. No abdominopelvic ascites. Musculoskeletal: There is grade 1 anterolisthesis of the L5 vertebral body on S1 with marked severity   multilevel degenerative changes. IMPRESSION: 1. Percutaneous biliary drainage catheter positioning, as described above. 2. Small right pleural effusion. 3. Marked severity chronic interstitial lung disease and peripheral pulmonary fibrosis. 4. Colonic diverticulosis. 5. Enlarged prostate gland. 6. Grade 1 anterolisthesis of the L5 vertebral body on S1 with marked severity multilevel degenerative changes. 7. Aortic atherosclerosis. Aortic Atherosclerosis (ICD10-I70.0). Electronically Signed   By: Thaddeus  Houston M.D.   On: 03/30/2021 18:32    Labs:  CBC: Recent Labs    03/30/21 1626 03/31/21 0557  WBC 17.9* 18.7*  HGB 13.5 11.9*  HCT 43.4 36.3*  PLT 279 255    COAGS: No results for input(s): INR, APTT in the last 8760 hours.  BMP: Recent Labs    03/30/21 1740 03/31/21 0557  NA 136 135  K 5.6* 4.9  CL 108 106  CO2 20* 22  GLUCOSE 132* 114*  BUN 13 12  CALCIUM 9.4 9.5  CREATININE 0.84 0.75  GFRNONAA >60 >60    LIVER FUNCTION TESTS: Recent Labs    03/30/21 1740  BILITOT 1.5*  AST 54*  ALT 21  ALKPHOS 130*  PROT 6.2*  ALBUMIN 2.9*    TUMOR MARKERS: No results for input(s): AFPTM, CEA, CA199, CHROMGRNA in the last 8760 hours.  Assessment and Plan: 86 y.o. male with recent diagnosis of sepsis would gangrenous cholecystitis s/p cholecystectomy on 02/21/2021 and percutaneous internal/external biliary drain placement on 02/24/2021 due to postoperative bile leak at [facility] who was brought to [facility] ED from nursing home due to dislodged bili drain.    IR was requested for image guided replacement of internal/external biliary drain. Case was reviewed and approved by Dr. McCullough, IR will attempt to replace the bili drain.   The procedure is tentatively scheduled for today pending IR schedule. Patient was admitted n.p.o. since midnight VS tachycardia HR 106, afebrile CBC with leukocytosis 18.7, anemia Hgb 11.9, PLT 255 CMP on 8/30 showed elevated T bili of 1.5, AST 54, ALT 21, alk phos 130, hyperkalemia 5.6  Risks and benefits of replacement of biliary drain were discussed with the patient including, but not limited to bleeding, infection which may lead to sepsis or even death and damage to adjacent structures. All of the patient's questions were answered, patient is agreeable to proceed.  Consent signed and in chart.   Thank you for this interesting consult.  I greatly enjoyed meeting Yashua R Loch and look forward to participating in their care.  A copy of this report was sent to the requesting provider on this date.  Electronically Signed:  H , PA-C 03/31/2021, 10:47 AM   I spent a total of 40 Minutes    in face to face in clinical consultation, greater than 50% of which was counseling/coordinating care for image guided replacement of internal/external biliary drain.  

## 2021-03-31 NOTE — TOC Initial Note (Signed)
Transition of Care [facility]) - Initial/Assessment Note    Patient Details  Name: William Buchanan MRN: [id number] Date of Birth: 01-Sep-1933  [facility] CM/SW Contact:    Judith Campillo, Marjie Skiff, RN Phone Number: 03/31/2021, 1:20 PM  Clinical Narrative:                 Pt is from [facility] and was there for short term rehab. He can go back at [facility]. Pt still in obs and has not been here 24hrs. He is awaiting Bili drain to be replaced. TOC will follow.     Activities of Daily Living Home Assistive Devices/Equipment: Wheelchair (pt does not answer this question accept for a wheelchair) ADL Screening (condition at time of admission) Patient's cognitive ability adequate to safely complete daily activities?: No Is the patient deaf or have difficulty hearing?: Yes Does the patient have difficulty seeing, even when wearing glasses/contacts?: Yes Does the patient have difficulty concentrating, remembering, or making decisions?: Yes Patient able to express need for assistance with ADLs?: Yes Does the patient have difficulty dressing or bathing?: Yes Independently performs ADLs?: No Communication: Independent Dressing (OT): Needs assistance Is this a change from baseline?: Pre-admission baseline Grooming: Independent Feeding: Independent Bathing: Needs assistance Is this a change from baseline?: Pre-admission baseline Toileting: Needs assistance Is this a change from baseline?: Pre-admission baseline In/Out Bed: Needs assistance Is this a change from baseline?: Pre-admission baseline Walks in Home: Dependent Is this a change from baseline?: Pre-admission baseline Does the patient have difficulty walking or climbing stairs?: Yes Weakness of Legs: Both Weakness of Arms/Hands: Both  Permission Sought/Granted                  Emotional Assessment              Admission diagnosis:  Abdominal pain [R10.9] Biliary drain displacement, initial encounter [T85.520A] Patient  Active Problem List   Diagnosis Date Noted   Abdominal pain 03/30/2021   Migration of percutaneous transhepatic biliary drain catheter 03/30/2021   S/P AKA (above knee amputation) unilateral, left ([place]) 03/30/2021   Fall from wheelchair 03/30/2021   Leukocytosis 03/30/2021   COPD (chronic obstructive pulmonary disease) ([place]) 03/30/2021   Hyperkalemia 03/30/2021   Critical lower limb ischemia ([place]) 06/07/2017   Severe protein-calorie malnutrition ([place]) 06/07/2017   Diverticulitis 02/18/2017   Arcus senilis, bilateral 01/10/2017   History of non-ST elevation myocardial infarction (NSTEMI) 12/26/2016   Bilateral carotid artery stenosis 08/25/2016   Benign neoplasm of colon 07/07/2015   Inflammatory polyarthritis ([place]) 07/07/2015   Type 2 diabetes mellitus with diabetic polyneuropathy, without long-term current use of insulin ([place]) 07/07/2015   Carotid atherosclerosis 07/07/2015   Local infection of skin and subcutaneous tissue 09/15/2014   Bunion 09/15/2014   Ulcer of heel and midfoot ([place]) 09/15/2014   Essential hypertension 04/16/2014   PCP:  Leota Jacobsen, MD Pharmacy:   [facility] [phone] - [place], [place] - [address] AT [facility] OF [address] [place] [address] Phone: [phone] Fax: [phone]     Social Determinants of Health (SDOH) Interventions    Readmission Risk Interventions No flowsheet data found.

## 2021-03-31 NOTE — Progress Notes (Signed)
PROGRESS NOTE    William Buchanan  [id number] DOB: 1933/09/15 DOA: 03/30/2021 PCP: Leota Jacobsen, MD    Brief Narrative:  This 87 years old male with PMH significant for hypertension, DM type II with peripheral neuropathy and vascular disease, COPD, BPH, s/p left AKA who was recently admitted to [facility] for severe sepsis and gangrenous cholecystitis.  Patient underwent  cholecystostomy and had a percutaneous biliary drain placed and he was discharged to skilled nursing facility.  He was in his wheelchair,  he bumped something and lost his balance and he fell and he did land on his abdomen and percutaneous biliary drain became dislodged and pulled out part away.  Patient has developed abdominal pain and was sent in the ED.  CT scan revealed biliary drain is dislodged and now in the right lobe of liver and not in the gallbladder fossa where it was initially placed.  Patient denies any head injury or loss of consciousness.  Patient has chronic pain in the left AKA stump which is unchanged. Patient is admitted for dislodged biliary drain.  IR is consulted for exchange or replacement.  Assessment & Plan:   Principal Problem:   Abdominal pain Active Problems:   Essential hypertension   Type 2 diabetes mellitus with diabetic polyneuropathy, without long-term current use of insulin (HCC)   Migration of percutaneous transhepatic biliary drain catheter   S/P AKA (above knee amputation) unilateral, left (HCC)   Fall from wheelchair   Leukocytosis   COPD (chronic obstructive pulmonary disease) ([place])   Hyperkalemia  Abdominal pain: Patient has fallen from wheelchair  at skilled nursing facility and has dislodged his percutaneous biliary catheter. Continue adequate pain control with Dilaudid. CT abdomen shows no fractures of the ribs or injuries to the internal organs, no free air or ascites. Continue IV hydration with Ringer's lactate. IR consulted for replacement of percutaneous  biliary drain. Continue IV Zosyn for now given leukocytosis.  Essential hypertension: Continue lisinopril  Type 2 diabetes with diabetic polyneuropathy: Continue Actos, continue sliding scale.  Leukocytosis: WBC 17, continue empiric IV Zosyn. Need to monitor WBC trend.  Hyperkalemia :  Patient was given Lokelma once,  potassium is improved.  COPD: Not in any acute exacerbation. Continue DuoNeb as needed,  continue supplemental oxygen to keep saturation above 92 to 96%.  Left AKA: Stable. continue pain medications.   DVT prophylaxis:  SCDs Code Status full code Family Communication: No family at bedside Disposition Plan:   Status is: Inpatient  Remains inpatient appropriate because:Inpatient level of care appropriate due to severity of illness  Dispo: The patient is from: SNF              Anticipated d/c is to: SNF              Patient currently is not medically stable to d/c.   Difficult to place patient No  Consultants:  IR.  Procedures:  Antimicrobials:   Anti-infectives (From admission, onward)    Start     Dose/Rate Route Frequency Ordered Stop   03/31/21 0400  piperacillin-tazobactam (ZOSYN) IVPB 3.375 g        3.375 g 12.5 mL/hr over 240 Minutes Intravenous Every 8 hours 03/30/21 2130     03/30/21 2100  piperacillin-tazobactam (ZOSYN) IVPB 3.375 g        3.375 g 100 mL/hr over 30 Minutes Intravenous  Once 03/30/21 2053 03/30/21 2137       Subjective: Patient was seen and examined at bedside.  Overnight events noted.  Patient is awake,  alert but frustrated with the long wait for exchange of percutaneous tube.  Patient denies any other concerns.  Objective: Vitals:   03/31/21 1625 03/31/21 1630 03/31/21 1635 03/31/21 1640  BP: (!) 112/59 114/68 128/62 121/69  Pulse: 87 100 61 100  Resp: 17 20 (!) 21 18  Temp:      TempSrc:      SpO2: 96% 99% 97% 94%  Weight:      Height:        Intake/Output Summary (Last 24 hours) at 03/31/2021 1643 Last  data filed at 03/31/2021 0359 Gross per 24 hour  Intake 215.74 ml  Output --  Net 215.74 ml   Filed Weights   03/30/21 1530  Weight: 65.8 kg    Examination:  General exam: Appears comfortable, deconditioned, not in any acute distress. Respiratory system: Clear to auscultation. Respiratory effort normal. Cardiovascular system: S1 & S2 heard, RRR. No JVD, murmurs, rubs, gallops or clicks. No pedal edema. Gastrointestinal system: Abdomen is soft, nondistended, nontender, drain noted. Central nervous system: Alert and oriented. No focal neurological deficits. Extremities: Left AKA, tenderness+ Skin: No rashes, lesions or ulcers Psychiatry: Judgement and insight appear normal. Mood & affect appropriate.     Data Reviewed: I have personally reviewed following labs and imaging studies  CBC: Recent Labs  Lab 03/30/21 1626 03/31/21 0557  WBC 17.9* 18.7*  NEUTROABS 15.8*  --   HGB 13.5 11.9*  HCT 43.4 36.3*  MCV 103.6* 98.4  PLT 279 [id number]   Basic Metabolic Panel: Recent Labs  Lab 03/30/21 1740 03/31/21 0557  NA 136 135  K 5.6* 4.9  CL 108 106  CO2 20* 22  GLUCOSE 132* 114*  BUN 13 12  CREATININE 0.84 0.75  CALCIUM 9.4 9.5   GFR: Estimated Creatinine Clearance: 64 mL/min (by C-G formula based on SCr of 0.75 mg/dL). Liver Function Tests: Recent Labs  Lab 03/30/21 1740  AST 54*  ALT 21  ALKPHOS 130*  BILITOT 1.5*  PROT 6.2*  ALBUMIN 2.9*   Recent Labs  Lab 03/30/21 1740  LIPASE 39   No results for input(s): AMMONIA in the last 168 hours. Coagulation Profile: No results for input(s): INR, PROTIME in the last 168 hours. Cardiac Enzymes: No results for input(s): CKTOTAL, CKMB, CKMBINDEX, TROPONINI in the last 168 hours. BNP (last 3 results) No results for input(s): PROBNP in the last 8760 hours. HbA1C: No results for input(s): HGBA1C in the last 72 hours. CBG: No results for input(s): GLUCAP in the last 168 hours. Lipid Profile: No results for input(s):  CHOL, HDL, LDLCALC, TRIG, CHOLHDL, LDLDIRECT in the last 72 hours. Thyroid Function Tests: No results for input(s): TSH, T4TOTAL, FREET4, T3FREE, THYROIDAB in the last 72 hours. Anemia Panel: No results for input(s): VITAMINB12, FOLATE, FERRITIN, TIBC, IRON, RETICCTPCT in the last 72 hours. Sepsis Labs: No results for input(s): PROCALCITON, LATICACIDVEN in the last 168 hours.  Recent Results (from the past 240 hour(s))  SARS CORONAVIRUS 2 (TAT 6-24 HRS) Nasopharyngeal Nasopharyngeal Swab     Status: None   Collection Time: 03/30/21  8:55 PM   Specimen: Nasopharyngeal Swab  Result Value Ref Range Status   SARS Coronavirus 2 NEGATIVE NEGATIVE Final    Comment: (NOTE) SARS-CoV-2 target nucleic acids are NOT DETECTED.  The SARS-CoV-2 RNA is generally detectable in upper and lower respiratory specimens during the acute phase of infection. Negative results do not preclude SARS-CoV-2 infection, do not rule out co-infections with  other pathogens, and should not be used as the sole basis for treatment or other patient management decisions. Negative results must be combined with clinical observations, patient history, and epidemiological information. The expected result is Negative.  Fact Sheet for Patients: SugarRoll.be  Fact Sheet for Healthcare Providers: https://www.woods-mathews.com/  This test is not yet approved or cleared by the [place] FDA and  has been authorized for detection and/or diagnosis of SARS-CoV-2 by FDA under an Emergency Use Authorization (EUA). This EUA will remain  in effect (meaning this test can be used) for the duration of the COVID-19 declaration under Se ction 564(b)(1) of the Act, 21 U.S.C. section 360bbb-3(b)(1), unless the authorization is terminated or revoked sooner.  Performed at [facility], [place] [address] [place], [place] [address]     Radiology Studies: CT ABDOMEN PELVIS WO  CONTRAST  Result Date: 03/30/2021 CLINICAL DATA:  Status post cholecystectomy 3 weeks ago with subsequent removal of biliary tube by the patient which was placed at the time of surgery. EXAM: CT ABDOMEN AND PELVIS WITHOUT CONTRAST TECHNIQUE: Multidetector CT imaging of the abdomen and pelvis was performed following the standard protocol without IV contrast. COMPARISON:  February 22, 2021 FINDINGS: Lower chest: Marked severity chronic interstitial lung disease and peripheral pulmonary fibrosis is noted. There is a small right pleural effusion. Hepatobiliary: No focal liver abnormality is seen. A percutaneous biliary drainage catheter is seen with its distal end noted within the right lobe of the liver, adjacent to the gallbladder fossa. Status post cholecystectomy. No biliary dilatation. Pancreas: Unremarkable. No pancreatic ductal dilatation or surrounding inflammatory changes. Spleen: Normal in size without focal abnormality. Adrenals/Urinary Tract: Adrenal glands are unremarkable. Kidneys are normal, without obstructing renal calculi, focal lesion, or hydronephrosis. A 4 mm calcification is seen along the medial aspect of the mid left kidney and may be vascular in origin. Bladder is unremarkable. Stomach/Bowel: Stomach is within normal limits. Appendix appears normal. Stool is seen throughout the colon. No evidence of bowel wall thickening, distention, or inflammatory changes. Noninflamed diverticula are seen throughout the large bowel. Vascular/Lymphatic: Aortic atherosclerosis. No enlarged abdominal or pelvic lymph nodes. Reproductive: There is mild to moderate severity prostate gland enlargement. Other: No abdominal wall hernia or abnormality. No abdominopelvic ascites. Musculoskeletal: There is grade 1 anterolisthesis of the L5 vertebral body on S1 with marked severity multilevel degenerative changes. IMPRESSION: 1. Percutaneous biliary drainage catheter positioning, as described above. 2. Small right pleural  effusion. 3. Marked severity chronic interstitial lung disease and peripheral pulmonary fibrosis. 4. Colonic diverticulosis. 5. Enlarged prostate gland. 6. Grade 1 anterolisthesis of the L5 vertebral body on S1 with marked severity multilevel degenerative changes. 7. Aortic atherosclerosis. Aortic Atherosclerosis (ICD10-I70.0). Electronically Signed   By: Virgina Norfolk M.D.   On: 03/30/2021 18:32     Scheduled Meds:  (feeding supplement) PROSource Plus  30 mL Oral BID BM   feeding supplement  1 Container Oral TID BM   lidocaine       multivitamin with minerals  1 tablet Oral Daily   pantoprazole  40 mg Oral Daily   sodium zirconium cyclosilicate  5 g Oral Once   tamsulosin  0.4 mg Oral Daily   Continuous Infusions:  sodium chloride 500 mL (03/31/21 0440)   lactated ringers 75 mL/hr at 03/31/21 1428   piperacillin-tazobactam (ZOSYN)  IV 3.375 g (03/31/21 1331)     LOS: 0 days    Time spent: [place], MD [facility]  If 7PM-7AM, please contact night-coverage  

## 2021-03-31 NOTE — ED Notes (Signed)
ED TO INPATIENT HANDOFF REPORT  Name/Age/Gender William Buchanan Last 87 y.o. male  Code Status    Code Status Orders  (From admission, onward)         Start     Ordered   03/30/21 2131  Full code  Continuous        03/30/21 2130        Code Status History    This patient has a current code status but no historical code status.      Home/SNF/Other Nursing Home  Chief Complaint Abdominal pain [R10.9]  Level of Care/Admitting Diagnosis ED Disposition    ED Disposition  Admit   Condition  --   Comment  Hospital Area: [facility] [id number]  Level of Care: Med-Surg [16]  May place patient in observation at [facility] or [facility] if equivalent level of care is available:: Yes  Covid Evaluation: Asymptomatic Screening Protocol (No Symptoms)  Diagnosis: Abdominal pain [id number]  Admitting Physician: Eben Burow [id number]  Attending Physician: Eben Burow [id number]         Medical History Past Medical History:  Diagnosis Date  . Arthritis   . Cancer ([place])   . Complication of anesthesia   . Diabetes mellitus without complication ([place])   . GERD (gastroesophageal reflux disease)   . Hypertension   . Myocardial infarction ([place])   . Peripheral vascular disease (HCC)     Allergies Allergies  Allergen Reactions  . Anesthetics, Amide     Made me crazy and hard to handle.  . Doxycycline Shortness Of Breath  . Zolpidem     Other reaction(s): Mental Status Changes (intolerance) "combative"  . Amoxicillin-Pot Clavulanate Rash    Unknown Unknown     IV Location/Drains/Wounds Patient Lines/Drains/Airways Status    Active Line/Drains/Airways    Name Placement date Placement time Site Days   Peripheral IV 03/30/21 20 G 1.88" Right;Lateral Forearm 03/30/21  1743  Forearm  1          Labs/Imaging Results for orders placed or performed during the hospital encounter of 03/30/21 (from the past 48 hour(s))  CBC with  Differential     Status: Abnormal   Collection Time: 03/30/21  4:26 PM  Result Value Ref Range   WBC 17.9 (H) 4.0 - 10.5 K/uL   RBC 4.19 (L) 4.22 - 5.81 MIL/uL   Hemoglobin 13.5 13.0 - 17.0 g/dL   HCT 43.4 39.0 - 52.0 %   MCV 103.6 (H) 80.0 - 100.0 fL   MCH 32.2 26.0 - 34.0 pg   MCHC 31.1 30.0 - 36.0 g/dL   RDW 18.6 (H) 11.5 - 15.5 %   Platelets 279 150 - 400 K/uL   nRBC 0.0 0.0 - 0.2 %   Neutrophils Relative % 90 %   Neutro Abs 15.8 (H) 1.7 - 7.7 K/uL   Lymphocytes Relative 5 %   Lymphs Abs 1.0 0.7 - 4.0 K/uL   Monocytes Relative 5 %   Monocytes Absolute 0.9 0.1 - 1.0 K/uL   Eosinophils Relative 0 %   Eosinophils Absolute 0.0 0.0 - 0.5 K/uL   Basophils Relative 0 %   Basophils Absolute 0.0 0.0 - 0.1 K/uL   Immature Granulocytes 0 %   Abs Immature Granulocytes 0.08 (H) 0.00 - 0.07 K/uL    Comment: Performed at [facility], [place] [address], [place], [place] [address]  Comprehensive metabolic panel     Status: Abnormal   Collection Time: 03/30/21  5:40  PM  Result Value Ref Range   Sodium 136 135 - 145 mmol/L   Potassium 5.6 (H) 3.5 - 5.1 mmol/L   Chloride 108 98 - 111 mmol/L   CO2 20 (L) 22 - 32 mmol/L   Glucose, Bld 132 (H) 70 - 99 mg/dL    Comment: Glucose reference range applies only to samples taken after fasting for at least 8 hours.   BUN 13 8 - 23 mg/dL   Creatinine, Ser 0.84 0.61 - 1.24 mg/dL   Calcium 9.4 8.9 - 10.3 mg/dL   Total Protein 6.2 (L) 6.5 - 8.1 g/dL   Albumin 2.9 (L) 3.5 - 5.0 g/dL   AST 54 (H) 15 - 41 U/L   ALT 21 0 - 44 U/L   Alkaline Phosphatase 130 (H) 38 - 126 U/L   Total Bilirubin 1.5 (H) 0.3 - 1.2 mg/dL   GFR, Estimated >60 >60 mL/min    Comment: (NOTE) Calculated using the CKD-EPI Creatinine Equation (2021)    Anion gap 8 5 - 15    Comment: Performed at [facility], [place] [address] [place], [place] [address]  Lipase, blood     Status: None   Collection Time: 03/30/21  5:40 PM  Result Value Ref Range    Lipase 39 11 - 51 U/L    Comment: Performed at [facility], [place] [address], [place], [place] [address]   CT ABDOMEN PELVIS WO CONTRAST  Result Date: 03/30/2021 CLINICAL DATA:  Status post cholecystectomy 3 weeks ago with subsequent removal of biliary tube by the patient which was placed at the time of surgery. EXAM: CT ABDOMEN AND PELVIS WITHOUT CONTRAST TECHNIQUE: Multidetector CT imaging of the abdomen and pelvis was performed following the standard protocol without IV contrast. COMPARISON:  February 22, 2021 FINDINGS: Lower chest: Marked severity chronic interstitial lung disease and peripheral pulmonary fibrosis is noted. There is a small right pleural effusion. Hepatobiliary: No focal liver abnormality is seen. A percutaneous biliary drainage catheter is seen with its distal end noted within the right lobe of the liver, adjacent to the gallbladder fossa. Status post cholecystectomy. No biliary dilatation. Pancreas: Unremarkable. No pancreatic ductal dilatation or surrounding inflammatory changes. Spleen: Normal in size without focal abnormality. Adrenals/Urinary Tract: Adrenal glands are unremarkable. Kidneys are normal, without obstructing renal calculi, focal lesion, or hydronephrosis. A 4 mm calcification is seen along the medial aspect of the mid left kidney and may be vascular in origin. Bladder is unremarkable. Stomach/Bowel: Stomach is within normal limits. Appendix appears normal. Stool is seen throughout the colon. No evidence of bowel wall thickening, distention, or inflammatory changes. Noninflamed diverticula are seen throughout the large bowel. Vascular/Lymphatic: Aortic atherosclerosis. No enlarged abdominal or pelvic lymph nodes. Reproductive: There is mild to moderate severity prostate gland enlargement. Other: No abdominal wall hernia or abnormality. No abdominopelvic ascites. Musculoskeletal: There is grade 1 anterolisthesis of the L5 vertebral body on S1 with marked  severity multilevel degenerative changes. IMPRESSION: 1. Percutaneous biliary drainage catheter positioning, as described above. 2. Small right pleural effusion. 3. Marked severity chronic interstitial lung disease and peripheral pulmonary fibrosis. 4. Colonic diverticulosis. 5. Enlarged prostate gland. 6. Grade 1 anterolisthesis of the L5 vertebral body on S1 with marked severity multilevel degenerative changes. 7. Aortic atherosclerosis. Aortic Atherosclerosis (ICD10-I70.0). Electronically Signed   By: Virgina Norfolk M.D.   On: 03/30/2021 18:32    Pending Labs [employer] (From admission, onward)    Start     Ordered  03/31/21 [id number]  Basic metabolic panel  Tomorrow morning,   R        03/30/21 2130   03/31/21 0500  CBC  Tomorrow morning,   R        03/30/21 2130   03/30/21 2045  SARS CORONAVIRUS 2 (TAT 6-24 HRS) Nasopharyngeal Nasopharyngeal Swab  (Tier 3 - Symptomatic/asymptomatic)  Once,   STAT       Question Answer Comment  Is this test for diagnosis or screening Screening   Symptomatic for COVID-19 as defined by CDC No   Hospitalized for COVID-19 No   Admitted to ICU for COVID-19 No   Previously tested for COVID-19 No   Resident in a congregate (group) care setting Yes   Employed in healthcare setting No   Has patient completed COVID vaccination(s) (2 doses of Pfizer/Moderna 1 dose of [employer]) Unknown      03/30/21 2044          Vitals/Pain Today's Vitals   03/30/21 2200 03/30/21 2330 03/30/21 2348 03/31/21 0130  BP: 97/63 (!) 100/54  107/72  Pulse: (!) 117 97  93  Resp:  19  16  Temp:      TempSrc:      SpO2: 100% 99%  100%  Weight:      Height:      PainSc:   3      Isolation Precautions No active isolations  Medications Medications  lisinopril-hydrochlorothiazide (ZESTORETIC) 20-25 MG per tablet 1 tablet (has no administration in time range)  pioglitazone (ACTOS) tablet 30 mg (has no administration in time range)  tamsulosin (FLOMAX) capsule  0.4 mg (has no administration in time range)  pantoprazole (PROTONIX) EC tablet 40 mg (has no administration in time range)  lactated ringers infusion ( Intravenous New Bag/Given 03/31/21 0140)  acetaminophen (TYLENOL) tablet 650 mg (has no administration in time range)    Or  acetaminophen (TYLENOL) suppository 650 mg (has no administration in time range)  HYDROmorphone (DILAUDID) injection 0.5 mg (0.5 mg Intravenous Given 03/30/21 2200)  ondansetron (ZOFRAN) tablet 4 mg ( Oral See Alternative 03/30/21 2159)    Or  ondansetron (ZOFRAN) injection 4 mg (4 mg Intravenous Given 03/30/21 2159)  senna-docusate (Senokot-S) tablet 1 tablet (has no administration in time range)  ipratropium-albuterol (DUONEB) 0.5-2.5 (3) MG/3ML nebulizer solution 3 mL (has no administration in time range)  piperacillin-tazobactam (ZOSYN) IVPB 3.375 g (has no administration in time range)  sodium zirconium cyclosilicate (LOKELMA) packet 5 g (0 g Oral Hold 03/31/21 0000)  ondansetron (ZOFRAN) injection 4 mg (4 mg Intravenous Given 03/30/21 1837)  fentaNYL (SUBLIMAZE) injection 50 mcg (50 mcg Intravenous Given 03/30/21 1839)  lactated ringers bolus 1,000 mL (0 mLs Intravenous Stopped 03/30/21 2311)  piperacillin-tazobactam (ZOSYN) IVPB 3.375 g (0 g Intravenous Stopped 03/30/21 2137)    Mobility non-ambulatory

## 2021-03-31 NOTE — ED Notes (Signed)
Pt refused hospital provided gown requesting to remain in own clothes. Repositioned and given fresh warm blankets.

## 2021-03-31 NOTE — Procedures (Signed)
Interventional Radiology Procedure Note  Procedure: Successful rescue of partially displaced biliary drain with placement of a new 59F internal/external PBD.    Complications: None  Estimated Blood Loss: None  Recommendations: - Drain to bag overnight.  - Consider capping drain tomorrow   Signed,  Criselda Peaches, MD

## 2021-03-31 NOTE — Progress Notes (Signed)
Initial Nutrition Assessment  DOCUMENTATION CODES:  Not applicable  INTERVENTION:  Advance diet as medically able.  Add Boost Breeze po TID, each supplement provides 250 kcal and 9 grams of protein.  Add 30 ml ProSource Plus po BID, each supplement provides 100 kcal and 15 grams of protein.    Add MVI with minerals daily.  NUTRITION DIAGNOSIS:  Inadequate oral intake related to decreased appetite as evidenced by per patient/family report.  GOAL:  Patient will meet greater than or equal to 90% of their needs  MONITOR:  Diet advancement, PO intake, Supplement acceptance, Labs, TF tolerance  REASON FOR ASSESSMENT:  Malnutrition Screening Tool    ASSESSMENT:  87 yo male with a PMH of PMHs of HTN, DMT2, COPD, MI, PVD on Plavix and aspirin, s/p left AKA who was recently admitted to [facility] for severe sepsis and gangrenous cholecystitis s/p cholecystectomy on [id number], complicated by bile leak s/p percutaneous int/ext biliary drain placement with [facility] IR on 02/24/2021, who was brought from nursing home to [facility] on 03/30/2021 due to dislodged biliary drain.  Pt in significant personal distress today. RD to follow up at a later date.  Pt admitted with abdominal pain, likely causing poor PO and weight loss with combination of bile leak.  No recent weight history in Epic. Per Care Everywhere, pt weighed 79.4 kg on 02/04/21. On admission, pt weighed 65.8 kg.   Pt has lost ~30 lbs (17%) in the last almost 2 months, which is significant and severe for the time frame.  Recommend adding Boost Breeze TID and ProSource Plus BID, as well as MVI with minerals daily.  Medications: reviewed; Protonix, LR @ 75 ml/hr, Dilaudid PRN (given 3 times today)  Labs: reviewed; Glucose 114 (H)  NUTRITION - FOCUSED PHYSICAL EXAM: Unable to perform  Diet Order:   Diet Order             Diet NPO time specified  Diet effective midnight                  EDUCATION  NEEDS:  Not appropriate for education at this time  Skin:  Skin Assessment: Reviewed RN Assessment  Last BM:  03/30/21  Height:  Ht Readings from Last 1 Encounters:  03/30/21 '5\' 9"'$  (1.753 m)   Weight:  Wt Readings from Last 1 Encounters:  03/30/21 65.8 kg   BMI:  Body mass index is 21.41 kg/m.  Estimated Nutritional Needs:  Kcal:  2000-2200 Protein:  100-115 grams Fluid:  >2 L  Derrel Nip, RD, LDN (she/her/hers) Registered Dietitian I After-Hours/Weekend Pager # in [place]

## 2021-04-01 LAB — CBC
HCT: 34.7 % — ABNORMAL LOW (ref 39.0–52.0)
Hemoglobin: 11.1 g/dL — ABNORMAL LOW (ref 13.0–17.0)
MCH: 31.7 pg (ref 26.0–34.0)
MCHC: 32 g/dL (ref 30.0–36.0)
MCV: 99.1 fL (ref 80.0–100.0)
Platelets: 251 10*3/uL (ref 150–400)
RBC: 3.5 MIL/uL — ABNORMAL LOW (ref 4.22–5.81)
RDW: 18.6 % — ABNORMAL HIGH (ref 11.5–15.5)
WBC: 15.3 10*3/uL — ABNORMAL HIGH (ref 4.0–10.5)
nRBC: 0 % (ref 0.0–0.2)

## 2021-04-01 MED ORDER — HYDROMORPHONE HCL 1 MG/ML IJ SOLN
0.5000 mg | INTRAMUSCULAR | Status: AC | PRN
  Administered 2021-04-01 – 2021-04-02 (×2): 0.5 mg via INTRAVENOUS
  Filled 2021-04-01 (×2): qty 0.5

## 2021-04-01 MED ORDER — MELATONIN 3 MG PO TABS
3.0000 mg | ORAL_TABLET | Freq: Once | ORAL | Status: AC
  Administered 2021-04-01: 3 mg via ORAL
  Filled 2021-04-01: qty 1

## 2021-04-01 MED ORDER — HYDROMORPHONE HCL 1 MG/ML IJ SOLN
0.5000 mg | INTRAMUSCULAR | Status: AC | PRN
  Administered 2021-04-01 (×2): 0.5 mg via INTRAVENOUS
  Filled 2021-04-01 (×2): qty 0.5

## 2021-04-01 NOTE — Progress Notes (Signed)
Upon assessment patient reveals he has been in pain  most of the day. Pt reports his sx site is also very painful and the phantom pain from his AKA periodically flares up and that pain seems more intense with the current pain. Off going RN reports she has had no success reaching house coverage to get patient pain coverage. Hence patients pain has gone unmanaged  during the day hours. Text message placed requesting pain management assistance. On going assessment. Diversional activites and positional changes made.

## 2021-04-01 NOTE — Progress Notes (Addendum)
PROGRESS NOTE    William Buchanan  [id number] DOB: Mar 13, 1934 DOA: 03/30/2021 PCP: Leota Jacobsen, MD    Brief Narrative:  This 87 years old male with PMH significant for hypertension, DM type II with peripheral neuropathy and vascular disease, COPD, BPH, s/p left AKA who was recently admitted to [facility] for severe sepsis and gangrenous cholecystitis.  Patient underwent  cholecystostomy and had a percutaneous biliary drain placed and he was discharged to skilled nursing facility.  He was in his wheelchair,  he bumped something and lost his balance and he fell and he did land on his abdomen and percutaneous biliary drain became dislodged and pulled out part away.  Patient has developed abdominal pain and was sent in the ED.  CT scan revealed biliary drain is dislodged and now in the right lobe of liver and not in the gallbladder fossa where it was initially placed.  Patient denies any head injury or loss of consciousness.  Patient has chronic pain in the left AKA stump which is unchanged. Patient is admitted for dislodged biliary drain.  Patient underwent successful rescue of dislodged biliary drain 8/31.  Assessment & Plan:   Principal Problem:   Abdominal pain Active Problems:   Essential hypertension   Type 2 diabetes mellitus with diabetic polyneuropathy, without long-term current use of insulin (HCC)   Migration of percutaneous transhepatic biliary drain catheter   S/P AKA (above knee amputation) unilateral, left (HCC)   Fall from wheelchair   Leukocytosis   COPD (chronic obstructive pulmonary disease) ([place])   Hyperkalemia  Abdominal pain: Patient has fallen from wheelchair at skilled nursing facility and has dislodged his percutaneous biliary catheter. Continue adequate pain control with Dilaudid. CT abdomen shows no fractures of the ribs or injuries to the internal organs, no free air or ascites. Continue IV hydration with Ringer's lactate. IR consulted for  replacement of percutaneous biliary drain. Continue IV Zosyn for now given leukocytosis. Patient underwent successful rescue of the partially dislodged biliary drain 8/31. Plan is for capping drain tomorrow.  Essential hypertension: Continue lisinopril.  Type 2 diabetes with diabetic polyneuropathy: Continue Actos, continue sliding scale.  Leukocytosis: WBC 17> 15, continue empiric IV Zosyn. Need to monitor WBC trend.  Hyperkalemia :  Patient was given Lokelma once,  potassium is improved.  COPD: Not in any acute exacerbation. Continue DuoNeb as needed,  continue supplemental oxygen to keep saturation above 92 to 96%.  Left AKA: Stable. continue pain medications.   DVT prophylaxis:  SCDs Code Status full code Family Communication: No family at bedside Disposition Plan:   Status is: Inpatient  Remains inpatient appropriate because:Inpatient level of care appropriate due to severity of illness  Dispo: The patient is from: SNF              Anticipated d/c is to: SNF              Patient currently is not medically stable to d/c.   Difficult to place patient No  Consultants:  IR.  Procedures: Rescue of partially dislodged biliary drain. Antimicrobials:   Anti-infectives (From admission, onward)    Start     Dose/Rate Route Frequency Ordered Stop   03/31/21 0400  piperacillin-tazobactam (ZOSYN) IVPB 3.375 g        3.375 g 12.5 mL/hr over 240 Minutes Intravenous Every 8 hours 03/30/21 2130     03/30/21 2100  piperacillin-tazobactam (ZOSYN) IVPB 3.375 g        3.375 g 100 mL/hr over  30 Minutes Intravenous  Once 03/30/21 2053 03/30/21 2137       Subjective: Patient was seen and examined at bedside.  Overnight events noted.   Patient underwent rescue of partially dislodged biliary drain yesterday.  Biliary drain output noted. Patient reports abdominal pain has improved.  Objective: Vitals:   03/31/21 1640 03/31/21 1745 03/31/21 2018 04/01/21 0621  BP: 121/69 (!)  105/53 109/61 125/73  Pulse: 100 96 92 100  Resp: '18 18 19 16  '$ Temp:  98.1 F (36.7 C) 98.7 F (37.1 C) 97.9 F (36.6 C)  TempSrc:  Oral Oral Oral  SpO2: 94% 97% 100% 90%  Weight:      Height:        Intake/Output Summary (Last 24 hours) at 04/01/2021 1545 Last data filed at 04/01/2021 0935 Gross per 24 hour  Intake 1073.86 ml  Output 1325 ml  Net -251.14 ml   Filed Weights   03/30/21 1530  Weight: 65.8 kg    Examination:  General exam: Appears comfortable, deconditioned, not in any acute distress. Respiratory system: Clear to auscultation. Respiratory effort normal. Cardiovascular system: S1 & S2 heard, RRR. No JVD, murmurs, rubs, gallops or clicks. No pedal edema. Gastrointestinal system: Abdomen is soft, nondistended, nontender, biliary drain with output noted. Central nervous system: Alert and oriented. No focal neurological deficits. Extremities: Left AKA, tenderness+ Skin: No rashes, lesions or ulcers Psychiatry: Judgement and insight appear normal. Mood & affect appropriate.     Data Reviewed: I have personally reviewed following labs and imaging studies  CBC: Recent Labs  Lab 03/30/21 1626 03/31/21 0557 04/01/21 0621  WBC 17.9* 18.7* 15.3*  NEUTROABS 15.8*  --   --   HGB 13.5 11.9* 11.1*  HCT 43.4 36.3* 34.7*  MCV 103.6* 98.4 99.1  PLT 279 255 [id number]   Basic Metabolic Panel: Recent Labs  Lab 03/30/21 1740 03/31/21 0557  NA 136 135  K 5.6* 4.9  CL 108 106  CO2 20* 22  GLUCOSE 132* 114*  BUN 13 12  CREATININE 0.84 0.75  CALCIUM 9.4 9.5   GFR: Estimated Creatinine Clearance: 64 mL/min (by C-G formula based on SCr of 0.75 mg/dL). Liver Function Tests: Recent Labs  Lab 03/30/21 1740  AST 54*  ALT 21  ALKPHOS 130*  BILITOT 1.5*  PROT 6.2*  ALBUMIN 2.9*   Recent Labs  Lab 03/30/21 1740  LIPASE 39   No results for input(s): AMMONIA in the last 168 hours. Coagulation Profile: No results for input(s): INR, PROTIME in the last 168  hours. Cardiac Enzymes: No results for input(s): CKTOTAL, CKMB, CKMBINDEX, TROPONINI in the last 168 hours. BNP (last 3 results) No results for input(s): PROBNP in the last 8760 hours. HbA1C: No results for input(s): HGBA1C in the last 72 hours. CBG: No results for input(s): GLUCAP in the last 168 hours. Lipid Profile: No results for input(s): CHOL, HDL, LDLCALC, TRIG, CHOLHDL, LDLDIRECT in the last 72 hours. Thyroid Function Tests: No results for input(s): TSH, T4TOTAL, FREET4, T3FREE, THYROIDAB in the last 72 hours. Anemia Panel: No results for input(s): VITAMINB12, FOLATE, FERRITIN, TIBC, IRON, RETICCTPCT in the last 72 hours. Sepsis Labs: No results for input(s): PROCALCITON, LATICACIDVEN in the last 168 hours.  Recent Results (from the past 240 hour(s))  SARS CORONAVIRUS 2 (TAT 6-24 HRS) Nasopharyngeal Nasopharyngeal Swab     Status: None   Collection Time: 03/30/21  8:55 PM   Specimen: Nasopharyngeal Swab  Result Value Ref Range Status   SARS Coronavirus 2 NEGATIVE NEGATIVE  Final    Comment: (NOTE) SARS-CoV-2 target nucleic acids are NOT DETECTED.  The SARS-CoV-2 RNA is generally detectable in upper and lower respiratory specimens during the acute phase of infection. Negative results do not preclude SARS-CoV-2 infection, do not rule out co-infections with other pathogens, and should not be used as the sole basis for treatment or other patient management decisions. Negative results must be combined with clinical observations, patient history, and epidemiological information. The expected result is Negative.  Fact Sheet for Patients: SugarRoll.be  Fact Sheet for Healthcare Providers: https://www.woods-mathews.com/  This test is not yet approved or cleared by the [place] FDA and  has been authorized for detection and/or diagnosis of SARS-CoV-2 by FDA under an Emergency Use Authorization (EUA). This EUA will remain  in  effect (meaning this test can be used) for the duration of the COVID-19 declaration under Se ction 564(b)(1) of the Act, 21 U.S.C. section 360bbb-3(b)(1), unless the authorization is terminated or revoked sooner.  Performed at [facility], [place] [address] [place], [place] [address]   MRSA Next Gen by PCR, Nasal     Status: None   Collection Time: 03/31/21  9:30 PM   Specimen: Nasal Mucosa; Nasal Swab  Result Value Ref Range Status   MRSA by PCR Next Gen NOT DETECTED NOT DETECTED Final    Comment: (NOTE) The GeneXpert MRSA Assay (FDA approved for NASAL specimens only), is one component of a comprehensive MRSA colonization surveillance program. It is not intended to diagnose MRSA infection nor to guide or monitor treatment for MRSA infections. Test performance is not FDA approved in patients less than 76 years old. Performed at [facility], [place] [address] [place], [place] [address]     Radiology Studies: CT ABDOMEN PELVIS WO CONTRAST  Result Date: 03/30/2021 CLINICAL DATA:  Status post cholecystectomy 3 weeks ago with subsequent removal of biliary tube by the patient which was placed at the time of surgery. EXAM: CT ABDOMEN AND PELVIS WITHOUT CONTRAST TECHNIQUE: Multidetector CT imaging of the abdomen and pelvis was performed following the standard protocol without IV contrast. COMPARISON:  February 22, 2021 FINDINGS: Lower chest: Marked severity chronic interstitial lung disease and peripheral pulmonary fibrosis is noted. There is a small right pleural effusion. Hepatobiliary: No focal liver abnormality is seen. A percutaneous biliary drainage catheter is seen with its distal end noted within the right lobe of the liver, adjacent to the gallbladder fossa. Status post cholecystectomy. No biliary dilatation. Pancreas: Unremarkable. No pancreatic ductal dilatation or surrounding inflammatory changes. Spleen: Normal in size without focal abnormality. Adrenals/Urinary  Tract: Adrenal glands are unremarkable. Kidneys are normal, without obstructing renal calculi, focal lesion, or hydronephrosis. A 4 mm calcification is seen along the medial aspect of the mid left kidney and may be vascular in origin. Bladder is unremarkable. Stomach/Bowel: Stomach is within normal limits. Appendix appears normal. Stool is seen throughout the colon. No evidence of bowel wall thickening, distention, or inflammatory changes. Noninflamed diverticula are seen throughout the large bowel. Vascular/Lymphatic: Aortic atherosclerosis. No enlarged abdominal or pelvic lymph nodes. Reproductive: There is mild to moderate severity prostate gland enlargement. Other: No abdominal wall hernia or abnormality. No abdominopelvic ascites. Musculoskeletal: There is grade 1 anterolisthesis of the L5 vertebral body on S1 with marked severity multilevel degenerative changes. IMPRESSION: 1. Percutaneous biliary drainage catheter positioning, as described above. 2. Small right pleural effusion. 3. Marked severity chronic interstitial lung disease and peripheral pulmonary fibrosis. 4. Colonic diverticulosis. 5. Enlarged prostate gland.  6. Grade 1 anterolisthesis of the L5 vertebral body on S1 with marked severity multilevel degenerative changes. 7. Aortic atherosclerosis. Aortic Atherosclerosis (ICD10-I70.0). Electronically Signed   By: Virgina Norfolk M.D.   On: 03/30/2021 18:32   IR EXCHANGE BILIARY DRAIN  Result Date: 03/31/2021 INDICATION: 87 year old male with a post cholecystectomy bile leak currently treated with an internal/external biliary drainage catheter. The patient fell and partially dislodged tooth catheter. He presents for catheter rescue and exchange. EXAM: Biliary catheter exchange MEDICATIONS: None. ANESTHESIA/SEDATION: Moderate (conscious) sedation was employed during this procedure. A total of Versed 1 mg and Fentanyl 50 mcg was administered intravenously. Moderate Sedation Time: 30 minutes. The  patient's level of consciousness and vital signs were monitored continuously by radiology nursing throughout the procedure under my direct supervision. FLUOROSCOPY TIME:  Fluoroscopy Time: 5 minutes 30 seconds (156 mGy). COMPLICATIONS: None immediate. PROCEDURE: Informed written consent was obtained from the patient after a thorough discussion of the procedural risks, benefits and alternatives. All questions were addressed. Maximal Sterile Barrier Technique was utilized including caps, mask, sterile gowns, sterile gloves, sterile drape, hand hygiene and skin antiseptic. A timeout was performed prior to the initiation of the procedure. Initial fluoroscopic spot imaging demonstrates a biliary drain pulled back into the periphery of the liver. The drainage catheter appears to be kinked on itself. The most proximal sideholes are noted to be external to the body. Contrast injection through the catheter was unsuccessful as the contrast exits the sideholes outside the liver in the peritoneal space. Therefore, the catheter was transected. A wire was passed through the catheter but could not be advanced through the kinked portion. Therefore, an 0 Prolene suture was passed through the external portion of the drainage catheter. A 9 French 35 cm vascular sheath was then modified by removing the hemostatic valve. The sheath was then advanced over the wire and suture combination and over the existing 8.5 [place] biliary drainage catheter. Utilizing the Prolene suture to create tension on the tube, the 9 French sheath was successfully advanced over the tube and into the hepatic parenchyma up to the kinked portion of the drainage catheter. The catheter was then un kinked allowing passage of the wire through the remaining catheter and into what appeared to represent the central biliary tree. The catheter was then further removed off the wire and through the sheath. A 5 French angled catheter was then advanced over the wire. Contrast  injection was performed confirming that the catheter was located in the duodenum. A Bentson wire was then advanced into the duodenum. The catheter and sheath were removed. A Cook 10.2 [place] biliary drainage catheter was then advanced over the wire and the distal loop formed in the duodenum. A contrast injection was then performed demonstrating patency of the tube which crosses the biliary system and enters the duodenum. The tube was gently flushed and secured to the skin with a combination of 0 Prolene suture and an adhesive fixation device. IMPRESSION: Successful rescue of transhepatic percutaneous biliary access with removal of the partially displaced 8.5 [place] biliary drainage catheter and placement of a new 10.2 French internal/external biliary drain. Electronically Signed   By: Jacqulynn Cadet M.D.   On: 03/31/2021 17:02     Scheduled Meds:  (feeding supplement) PROSource Plus  30 mL Oral BID BM   feeding supplement  1 Container Oral TID BM   gabapentin  100 mg Oral BID   multivitamin with minerals  1 tablet Oral Daily   pantoprazole  40 mg  Oral Daily   sodium zirconium cyclosilicate  5 g Oral Once   tamsulosin  0.4 mg Oral Daily   Continuous Infusions:  sodium chloride Stopped (03/31/21 2120)   lactated ringers 75 mL/hr at 04/01/21 0615   piperacillin-tazobactam (ZOSYN)  IV 3.375 g (04/01/21 1430)     LOS: 1 day    Time spent: [place], MD [facility]   If 7PM-7AM, please contact night-coverage

## 2021-04-02 ENCOUNTER — Inpatient Hospital Stay (HOSPITAL_COMMUNITY): Payer: Medicare (Managed Care)

## 2021-04-02 DIAGNOSIS — I10 Essential (primary) hypertension: Secondary | ICD-10-CM

## 2021-04-02 DIAGNOSIS — Z89612 Acquired absence of left leg above knee: Secondary | ICD-10-CM

## 2021-04-02 DIAGNOSIS — E875 Hyperkalemia: Secondary | ICD-10-CM

## 2021-04-02 DIAGNOSIS — T85520A Displacement of bile duct prosthesis, initial encounter: Secondary | ICD-10-CM

## 2021-04-02 DIAGNOSIS — I2699 Other pulmonary embolism without acute cor pulmonale: Secondary | ICD-10-CM

## 2021-04-02 LAB — CBC
HCT: 32.1 % — ABNORMAL LOW (ref 39.0–52.0)
Hemoglobin: 10.1 g/dL — ABNORMAL LOW (ref 13.0–17.0)
MCH: 31.5 pg (ref 26.0–34.0)
MCHC: 31.5 g/dL (ref 30.0–36.0)
MCV: 100 fL (ref 80.0–100.0)
Platelets: 250 10*3/uL (ref 150–400)
RBC: 3.21 MIL/uL — ABNORMAL LOW (ref 4.22–5.81)
RDW: 18 % — ABNORMAL HIGH (ref 11.5–15.5)
WBC: 11.7 10*3/uL — ABNORMAL HIGH (ref 4.0–10.5)
nRBC: 0 % (ref 0.0–0.2)

## 2021-04-02 LAB — GLUCOSE, CAPILLARY
Glucose-Capillary: 231 mg/dL — ABNORMAL HIGH (ref 70–99)
Glucose-Capillary: 119 mg/dL — ABNORMAL HIGH (ref 70–99)

## 2021-04-02 LAB — HEMOGLOBIN A1C
Hgb A1c MFr Bld: 7.5 % — ABNORMAL HIGH (ref 4.8–5.6)
Mean Plasma Glucose: 168.55 mg/dL

## 2021-04-02 MED ORDER — HYDROMORPHONE HCL 1 MG/ML IJ SOLN
1.0000 mg | INTRAMUSCULAR | Status: DC | PRN
  Administered 2021-04-02 – 2021-04-03 (×6): 1 mg via INTRAVENOUS
  Filled 2021-04-02 (×6): qty 1

## 2021-04-02 MED ORDER — INSULIN ASPART 100 UNIT/ML IJ SOLN
0.0000 [IU] | Freq: Three times a day (TID) | INTRAMUSCULAR | Status: DC
Start: 2021-04-02 — End: 2021-04-05
  Administered 2021-04-02: 3 [IU] via SUBCUTANEOUS
  Administered 2021-04-03: 1 [IU] via SUBCUTANEOUS
  Administered 2021-04-03: 5 [IU] via SUBCUTANEOUS
  Administered 2021-04-04 – 2021-04-05 (×4): 2 [IU] via SUBCUTANEOUS
  Administered 2021-04-05: 3 [IU] via SUBCUTANEOUS

## 2021-04-02 MED ORDER — APIXABAN 5 MG PO TABS
5.0000 mg | ORAL_TABLET | Freq: Two times a day (BID) | ORAL | Status: DC
  Administered 2021-04-02 – 2021-04-08 (×13): 5 mg via ORAL
  Filled 2021-04-02 (×13): qty 1

## 2021-04-02 MED ORDER — IOHEXOL 9 MG/ML PO SOLN: 500.0000 mL | ORAL | Status: AC

## 2021-04-02 MED ORDER — MORPHINE SULFATE (PF) 2 MG/ML IV SOLN: 2.0000 mg | INTRAVENOUS | Status: DC | PRN

## 2021-04-02 MED ORDER — MELATONIN 3 MG PO TABS
3.0000 mg | ORAL_TABLET | Freq: Every evening | ORAL | Status: DC | PRN
  Administered 2021-04-03 – 2021-04-07 (×6): 3 mg via ORAL
  Filled 2021-04-02 (×6): qty 1

## 2021-04-02 NOTE — Progress Notes (Signed)
[place] [address] [facility]) Hospital Liaison note:  This patient is currently enrolled in [facility] outpatient-based Palliative Care. Will continue to follow for disposition.  Please call with any outpatient palliative questions or concerns.  Thank you, Lorelee Market, LPN [facility] Liaison [phone]

## 2021-04-02 NOTE — Progress Notes (Addendum)
PROGRESS NOTE    William Buchanan  [id number] DOB: [birth year] DOA: 03/30/2021 PCP: Leota Jacobsen, MD    Brief Narrative:  Mr. William Buchanan was admitted to the hospital with the working diagnosis of dislodged percutaneous biliary cathter.   87 year old male past medical history for hypertension, type 2 diabetes mellitus, peripheral vascular disease, COPD, BPH, status post left AKA with recent hospitalization [name]) for severe sepsis and gangrenous cholecystitis.  He is s/p cholecystostomy and percutaneous biliary drain placement, he was discharged to a skilled nursing facility.  While in the skilled nursing facility patient lost his balance and fell from his wheelchair, dislodging and pulling out his biliary drain.  Patient complained of severe abdominal pain and was transported to the emergency department.  On his initial physical examination blood pressure 118/72, heart rate 116, respiratory rate 18, oxygen saturation 94%, his lungs had diffuse rales no wheezing, heart S1-S2, present, rhythmic, abdomen tender to palpation in the upper zones, nondistended, no rebound, no lower extremity edema.  Sodium 136, potassium 5.6, chloride 108, bicarb 20, glucose 132, BUN 13, creatinine 0.84, lipase 39, AST 54, ALT 21, total bilirubin is 1.5, white count 17.9, hemoglobin 13.5, hematocrit 43.4, platelets 279. SARS COVID-19 negative.  CT of the abdomen and pelvis with percutaneous biliary drain catheter positioning within the right lobe of the liver, adjacent to the gallbladder fossa.  No biliary dilatation.  Patient was placed on analgesics and antibiotics.  IR performed a successful rescue of transhepatic percutaneous biliary access with removal of the partially displaced 8.5 [place] biliary drainage catheter and placement of a new 10.2 French internal/external biliary drain.  Assessment & Plan:   Principal Problem:   Abdominal pain Active Problems:   Essential hypertension   Severe  protein-calorie malnutrition (HCC)   Type 2 diabetes mellitus with diabetic polyneuropathy, without long-term current use of insulin (HCC)   Migration of percutaneous transhepatic biliary drain catheter   S/P AKA (above knee amputation) unilateral, left ([place])   Fall from wheelchair   Leukocytosis   COPD (chronic obstructive pulmonary disease) ([place])   Hyperkalemia   Pulmonary embolism (HCC)   Acute peritonitis due to dislodged biliary drain/ complicated with sepsis (present on admission). Patient had his biliary drain replaced with good toleration. Drain output 350 ml.  Patient with severe and persistent abdominal pain, diffuse. Wbc is down to 11,7.   Plan to continue supportive medical therapy with IV Zoysn and add hydromorphone as needed for pain control.  Because worsening pain will repeat CT abdomen and pelvis. Continue with IV fluids for volume repletion.   2. HTN blood pressure systolic 99 to [id number] mmHg, will discontinue lisinopril and continue IV fluids. Close blood pressure monitoring.   3. T2DM add insulin sliding scale for glucose cover and monitoring. Patient on a soft diet.   4. Hyperkalemia his renal function has been stable, with serum cr at 0,75 and K down to 4,9. Follow renal function and electrolytes in am, avoid hypotension and nephrotoxic medications.   5. COPD/ Pulmonary embolism (right lower lobe segmental branches).  no acute COPD exacerbation Continue anticoagulation with apixaban.   6. Ambulatory dysfunction due to AKA. Severe protein calorie malnutrition.Plan to return to SNF when medically stable.  Consult nutrition for supplements.    Patient continue to be at high risk for worsening sepsis   Status is: Inpatient  Remains inpatient appropriate because:Inpatient level of care appropriate due to severity of illness  Dispo: The patient is from: SNF  Anticipated d/c is to: SNF              Patient currently is not medically stable to d/c.    Difficult to place patient No   DVT prophylaxis: Apixaban   Code Status:    full  Family Communication:   No family at the bedside      Nutrition Status: Nutrition Problem: Inadequate oral intake Etiology: decreased appetite Signs/Symptoms: per patient/family report Interventions: Boost Breeze, Prostat, MVI     Consultants:  IR   Procedures:  Replacement of biliary drain   Antimicrobials:  Zosyn    Subjective: Patient with sever abdominal pain that has been severe in intensity, improved only mildly with analgesics, no nausea or vomiting.   Objective: Vitals:   04/01/21 0621 04/01/21 1627 04/01/21 2034 04/02/21 0519  BP: 125/73 (!) 101/51 (!) 103/55 99/62  Pulse: 100 68 68 93  Resp: '16 16 18 16  '$ Temp: 97.9 F (36.6 C) 97.8 F (36.6 C) 98.3 F (36.8 C) 97.9 F (36.6 C)  TempSrc: Oral Oral Oral Oral  SpO2: 90% 93% 100% 99%  Weight:      Height:        Intake/Output Summary (Last 24 hours) at 04/02/2021 1106 Last data filed at 04/02/2021 1030 Gross per 24 hour  Intake 2556.7 ml  Output 1620 ml  Net 936.7 ml   Filed Weights   03/30/21 1530  Weight: 65.8 kg    Examination:   General: Deconditioned and in pain.  Neurology: Awake and alert, non focal  E ENT: no pallor, no icterus, oral mucosa moist Cardiovascular: No JVD. S1-S2 present, rhythmic, no gallops, rubs, or murmurs. No lower extremity edema. Pulmonary: positive breath sounds bilaterally,with no wheezing, rhonchi or rales. Gastrointestinal. Abdomen not distended, biliary drain in place, tender to superficial palpation, positive guarding and rebound.  Skin. No rashes Musculoskeletal: no joint deformities     Data Reviewed: I have personally reviewed following labs and imaging studies  CBC: Recent Labs  Lab 03/30/21 1626 03/31/21 0557 04/01/21 0621 04/02/21 0549  WBC 17.9* 18.7* 15.3* 11.7*  NEUTROABS 15.8*  --   --   --   HGB 13.5 11.9* 11.1* 10.1*  HCT 43.4 36.3* 34.7* 32.1*  MCV  103.6* 98.4 99.1 100.0  PLT 279 255 251 [id number]   Basic Metabolic Panel: Recent Labs  Lab 03/30/21 1740 03/31/21 0557  NA 136 135  K 5.6* 4.9  CL 108 106  CO2 20* 22  GLUCOSE 132* 114*  BUN 13 12  CREATININE 0.84 0.75  CALCIUM 9.4 9.5   GFR: Estimated Creatinine Clearance: 64 mL/min (by C-G formula based on SCr of 0.75 mg/dL). Liver Function Tests: Recent Labs  Lab 03/30/21 1740  AST 54*  ALT 21  ALKPHOS 130*  BILITOT 1.5*  PROT 6.2*  ALBUMIN 2.9*   Recent Labs  Lab 03/30/21 1740  LIPASE 39   No results for input(s): AMMONIA in the last 168 hours. Coagulation Profile: No results for input(s): INR, PROTIME in the last 168 hours. Cardiac Enzymes: No results for input(s): CKTOTAL, CKMB, CKMBINDEX, TROPONINI in the last 168 hours. BNP (last 3 results) No results for input(s): PROBNP in the last 8760 hours. HbA1C: No results for input(s): HGBA1C in the last 72 hours. CBG: No results for input(s): GLUCAP in the last 168 hours. Lipid Profile: No results for input(s): CHOL, HDL, LDLCALC, TRIG, CHOLHDL, LDLDIRECT in the last 72 hours. Thyroid Function Tests: No results for input(s): TSH, T4TOTAL, FREET4, T3FREE, THYROIDAB  in the last 72 hours. Anemia Panel: No results for input(s): VITAMINB12, FOLATE, FERRITIN, TIBC, IRON, RETICCTPCT in the last 72 hours.    Radiology Studies: I have reviewed all of the imaging during this hospital visit personally     Scheduled Meds:  (feeding supplement) PROSource Plus  30 mL Oral BID BM   apixaban  5 mg Oral BID   feeding supplement  1 Container Oral TID BM   multivitamin with minerals  1 tablet Oral Daily   pantoprazole  40 mg Oral Daily   sodium zirconium cyclosilicate  5 g Oral Once   tamsulosin  0.4 mg Oral Daily   Continuous Infusions:  sodium chloride Stopped (03/31/21 2120)   lactated ringers 75 mL/hr at 04/02/21 0600   piperacillin-tazobactam (ZOSYN)  IV 3.375 g (04/02/21 [id number])     LOS: 2 days         Luverne Zerkle Gerome Apley, MD

## 2021-04-02 NOTE — Progress Notes (Signed)
Nutrition Follow-up  DOCUMENTATION CODES:   Not applicable  INTERVENTION:  - continue Boost Breeze TID and 30 ml Prosource Plus BID.  - complete NFPE when feasible.  - weigh patient today.   NUTRITION DIAGNOSIS:   Inadequate oral intake related to decreased appetite as evidenced by per patient/family report. -improving  GOAL:   Patient will meet greater than or equal to 90% of their needs -minimally met on average  MONITOR:   PO intake, Supplement acceptance, Labs, Weight trends  REASON FOR ASSESSMENT:   Consult Assessment of nutrition requirement/status  ASSESSMENT:   87 yo male with a PMH of PMHs of HTN, DMT2, COPD, MI, PVD on Plavix and aspirin, s/p left AKA who was recently admitted to [facility] for severe sepsis and gangrenous cholecystitis s/p cholecystectomy on 9/67/8938, complicated by bile leak s/p percutaneous int/ext biliary drain placement with [facility] IR on 02/24/2021, who was brought from nursing home to [facility] on 03/30/2021 due to dislodged biliary drain.  Patient needed assistance from RN while RD was on the floor earlier. He is now out of the room to IR.   Recent meal intakes: 100% of dinner on 8/31 (542 kcal and 29 grams protein); 100% of breakfast on 9/1 (847 kcal and 24 grams protein); 100% of breakfast today (755 kcal and 31 grams protein).  Review of orders indicates he has been accepting Boost Breeze 90% of the time offered and Prosource Plus 100% of the time offered.   The only weight recorded from this admission was admission weight, which appears to be a stated weight. No information documented in the edema section of flow sheet this admission.     Labs reviewed. Medications reviewed; sliding scale novolog, 1 tablet multivitamin with minerals/day, 40 mg oral protonix/day, 5 g lokelma x1 dose 8/30. IVF; LR @ 75 ml/hr.     NUTRITION - FOCUSED PHYSICAL EXAM:  Unable to complete d/t patient out of the room.  Diet  Order:   Diet Order             DIET SOFT Room service appropriate? Yes; Fluid consistency: Thin  Diet effective now                   EDUCATION NEEDS:   Not appropriate for education at this time  Skin:  Skin Assessment: Reviewed RN Assessment  Last BM:  03/30/21  Height:   Ht Readings from Last 1 Encounters:  03/30/21 5' 9"  (1.753 m)    Weight:   Wt Readings from Last 1 Encounters:  03/30/21 65.8 kg     Estimated Nutritional Needs:  Kcal:  2000-2200 Protein:  100-115 grams Fluid:  >2 L      Jarome Matin, MS, RD, LDN, CNSC Inpatient Clinical Dietitian RD pager # available in [facility]  After hours/weekend pager # available in [facility]

## 2021-04-03 LAB — CBC WITH DIFFERENTIAL/PLATELET
Abs Immature Granulocytes: 0.09 10*3/uL — ABNORMAL HIGH (ref 0.00–0.07)
Basophils Absolute: 0.1 10*3/uL (ref 0.0–0.1)
Basophils Relative: 0 %
Eosinophils Absolute: 0.2 10*3/uL (ref 0.0–0.5)
Eosinophils Relative: 2 %
HCT: 35.8 % — ABNORMAL LOW (ref 39.0–52.0)
Hemoglobin: 11.5 g/dL — ABNORMAL LOW (ref 13.0–17.0)
Immature Granulocytes: 1 %
Lymphocytes Relative: 18 %
Lymphs Abs: 2.2 10*3/uL (ref 0.7–4.0)
MCH: 31.6 pg (ref 26.0–34.0)
MCHC: 32.1 g/dL (ref 30.0–36.0)
MCV: 98.4 fL (ref 80.0–100.0)
Monocytes Absolute: 1.7 10*3/uL — ABNORMAL HIGH (ref 0.1–1.0)
Monocytes Relative: 13 %
Neutro Abs: 8.4 10*3/uL — ABNORMAL HIGH (ref 1.7–7.7)
Neutrophils Relative %: 66 %
Platelets: 316 10*3/uL (ref 150–400)
RBC: 3.64 MIL/uL — ABNORMAL LOW (ref 4.22–5.81)
RDW: 17.6 % — ABNORMAL HIGH (ref 11.5–15.5)
WBC: 12.7 10*3/uL — ABNORMAL HIGH (ref 4.0–10.5)
nRBC: 0 % (ref 0.0–0.2)

## 2021-04-03 LAB — BASIC METABOLIC PANEL
Anion gap: 6 (ref 5–15)
BUN: 11 mg/dL (ref 8–23)
CO2: 25 mmol/L (ref 22–32)
Calcium: 9.6 mg/dL (ref 8.9–10.3)
Chloride: 103 mmol/L (ref 98–111)
Creatinine, Ser: 0.94 mg/dL (ref 0.61–1.24)
GFR, Estimated: >60 mL/min (ref >60)
Glucose, Bld: 153 mg/dL — ABNORMAL HIGH (ref 70–99)
Potassium: 4.1 mmol/L (ref 3.5–5.1)
Sodium: 134 mmol/L — ABNORMAL LOW (ref 135–145)

## 2021-04-03 LAB — GLUCOSE, CAPILLARY
Glucose-Capillary: 144 mg/dL — ABNORMAL HIGH (ref 70–99)
Glucose-Capillary: 168 mg/dL — ABNORMAL HIGH (ref 70–99)
Glucose-Capillary: 291 mg/dL — ABNORMAL HIGH (ref 70–99)
Glucose-Capillary: 125 mg/dL — ABNORMAL HIGH (ref 70–99)

## 2021-04-03 LAB — URINALYSIS, ROUTINE W REFLEX MICROSCOPIC
Bacteria, UA: NONE SEEN
Bilirubin Urine: NEGATIVE
Glucose, UA: NEGATIVE mg/dL
Hgb urine dipstick: NEGATIVE
Ketones, ur: 40 mg/dL — AB
Leukocytes,Ua: NEGATIVE
Nitrite: NEGATIVE
Specific Gravity, Urine: 1.025 (ref 1.005–1.030)
pH: 6 (ref 5.0–8.0)

## 2021-04-03 MED ORDER — PHENAZOPYRIDINE HCL 100 MG PO TABS
100.0000 mg | ORAL_TABLET | Freq: Three times a day (TID) | ORAL | Status: DC
  Administered 2021-04-03 – 2021-04-05 (×7): 100 mg via ORAL
  Filled 2021-04-03 (×8): qty 1

## 2021-04-03 MED ORDER — OXYCODONE-ACETAMINOPHEN 5-325 MG PO TABS
1.0000 | ORAL_TABLET | Freq: Three times a day (TID) | ORAL | Status: DC | PRN
Start: 2021-04-03 — End: 2021-04-04
  Administered 2021-04-03 – 2021-04-04 (×3): 1 via ORAL
  Filled 2021-04-03 (×3): qty 1

## 2021-04-03 NOTE — Progress Notes (Signed)
Pt screaming out in pain, RN to room with PRN med and scheduled meds. Pt seems more confused today and is c/o burning when urinating and having frequency. RN notified MD of increasing WBC, dysuria and increasing confusion, See new orders.

## 2021-04-03 NOTE — Progress Notes (Signed)
Patient ID: William Buchanan, male   DOB: May 03, 1934, 87 y.o.   MRN: [id number] Patient lying in bed and pulling at clothing and bandage around the IV site; appears confused;per nursing also with dysuria; biliary drain is intact and irrigates without difficulty.  Output yesterday 675 cc, today 150 cc bile ; no hemobilia ;afebrile; WBC 12.7 up slightly from 11.7, hemoglobin stable, creat nl; follow-up CT abdomen pelvis yesterday revealed:  IMPRESSION: 1. Postsurgical change of cholecystectomy with similar stranding in the surgical bed. No walled off fluid collections. 2. Central area of high density with surrounding soft tissue and mild inflammation measuring 11 mm along the inferior margin of the right lobe of the liver, slightly more prominent than on prior CT and new from CT February 18, 2021, possibly reflecting inflammation about a dropped gallstone. 3. No significant abdominopelvic ascites or peritoneal thickening visualized. 4. Similar positioning of the percutaneous biliary drainage catheter with pigtail coiled in the duodenum. 5. Small right greater than left pleural effusions with a right basilar consolidation possibly reflecting infection or atelectasis. 6. Colonic diverticulosis without findings of acute diverticulitis. 7.  Aortic Atherosclerosis  Patient with prior history of cholecystectomy and subsequent postop bile leak ;status post successful rescue of transhepatic percutaneous biliary access with removal of partially displaced biliary drain and placement of new 10.2 French internal and external biliary drain(originally placed at [facility]) on 8/31; cont current tx; maintain f/u with [facility] team

## 2021-04-03 NOTE — Progress Notes (Signed)
PROGRESS NOTE    William Buchanan  [id number] DOB: May 27, 1934 DOA: 03/30/2021 PCP: Leota Jacobsen, MD    Brief Narrative:  This 87 years old male with PMH significant for hypertension, DM type II with peripheral neuropathy and vascular disease, COPD, BPH, s/p left AKA who was recently admitted to [facility] for severe sepsis and gangrenous cholecystitis.  Patient underwent  cholecystostomy and had a percutaneous biliary drain placed and he was discharged to skilled nursing facility.  He was in his wheelchair,  he bumped something and lost his balance and he fell and he did land on his abdomen and percutaneous biliary drain became dislodged and pulled out part away.  Patient has developed abdominal pain and was sent in the ED.  CT scan revealed biliary drain is dislodged and now in the right lobe of liver and not in the gallbladder fossa where it was initially placed.  Patient denies any head injury or loss of consciousness.  Patient has chronic pain in the left AKA stump which is unchanged. Patient is admitted for dislodged biliary drain.  Patient underwent successful rescue of dislodged biliary drain with removal of partially displaced 8.5 [place] biliary drainage catheter and placement of a new 10.2 French internal/external biliary drain on 8/31.  Assessment & Plan:   Principal Problem:   Abdominal pain Active Problems:   Essential hypertension   Severe protein-calorie malnutrition (HCC)   Type 2 diabetes mellitus with diabetic polyneuropathy, without long-term current use of insulin (HCC)   Migration of percutaneous transhepatic biliary drain catheter   S/P AKA (above knee amputation) unilateral, left ([place])   Fall from wheelchair   Leukocytosis   COPD (chronic obstructive pulmonary disease) ([place])   Hyperkalemia   Pulmonary embolism (HCC)  Abdominal pain /sepsis secondary to peritonitis from dislodged biliary drain.: Patient has fallen from wheelchair at skilled nursing  facility and has dislodged his percutaneous biliary catheter. Continue adequate pain control with Dilaudid. CT abdomen shows no fractures of the ribs or injuries to the internal organs, no free air or ascites. Patient presented with  tachycardia, tachypnea, leukocytosis, hypotension, lactic acid normal.. Continue IV hydration with Ringer's lactate.  Continue IV Zosyn. IR consulted for replacement of percutaneous biliary drain. Patient underwent successful rescue of the partially dislodged biliary drain 8/31. Continue current management and follow-up [facility] medical team.  Essential hypertension: Lisinopril is on hold due to hypotension.   Resume when blood pressure improves.  Type 2 diabetes with diabetic polyneuropathy: Continue Actos, continue sliding scale.  Hyperkalemia :  Patient was given Lokelma once,  potassium is improved.  COPD / Hx. of PE:  Not in any acute exacerbation. Continue DuoNeb as needed,   Continue supplemental oxygen to keep saturation above 92 to 96%. Continue Eliquis for PE.  Left AKA /Ambulatory dysfunction: Stable. continue pain medications. Plan to return to SNF when medically stable.   DVT prophylaxis:  SCDs Code Status full code Family Communication: No family at bedside Disposition Plan:   Status is: Inpatient  Remains inpatient appropriate because:Inpatient level of care appropriate due to severity of illness  Dispo: The patient is from: SNF              Anticipated d/c is to: SNF              Patient currently is not medically stable to d/c.   Difficult to place patient No  Consultants:  IR.  Procedures: Rescue of partially dislodged biliary drain. Antimicrobials:   Anti-infectives (  From admission, onward)    Start     Dose/Rate Route Frequency Ordered Stop   03/31/21 0400  piperacillin-tazobactam (ZOSYN) IVPB 3.375 g        3.375 g 12.5 mL/hr over 240 Minutes Intravenous Every 8 hours 03/30/21 2130     03/30/21 2100   piperacillin-tazobactam (ZOSYN) IVPB 3.375 g        3.375 g 100 mL/hr over 30 Minutes Intravenous  Once 03/30/21 2053 03/30/21 2137       Subjective: Patient was seen and examined at bedside.  No overnight events. Patient underwent successful rescue of partially dislodged biliary drain.  Biliary drain is improved. Patient reports having abdominal pain, improving better than before.  Objective: Vitals:   04/02/21 2054 04/03/21 0623 04/03/21 1428 04/03/21 1531  BP: (!) 154/93 128/71 123/77   Pulse: (!) 110 (!) 103 (!) 115 (!) 101  Resp: '20 20 18   '$ Temp: 99.3 F (37.4 C) 98.9 F (37.2 C) 98 F (36.7 C)   TempSrc: Oral Oral Oral   SpO2: 91% 91% 96%   Weight:      Height:        Intake/Output Summary (Last 24 hours) at 04/03/2021 1552 Last data filed at 04/03/2021 1200 Gross per 24 hour  Intake 360 ml  Output 625 ml  Net -265 ml   Filed Weights   03/30/21 1530  Weight: 65.8 kg    Examination:  General exam: Appears comfortable, not in any acute distress. Respiratory system: Clear to auscultation bilaterally, respiratory effort normal. Cardiovascular system: S1 & S2 heard, RRR. No JVD, murmurs, rubs, gallops or clicks. No pedal edema. Gastrointestinal system: Abdomen is soft, nontender nondistended, biliary drain with good output noted.  Central nervous system: Alert and oriented x 2. No focal neurological deficits. Extremities: Left AKA, tenderness-, no signs of infection. Skin: No rashes, lesions or ulcers Psychiatry: Judgement and insight appear normal. Mood & affect appropriate.     Data Reviewed: I have personally reviewed following labs and imaging studies  CBC: Recent Labs  Lab 03/30/21 1626 03/31/21 0557 04/01/21 0621 04/02/21 0549 04/03/21 0640  WBC 17.9* 18.7* 15.3* 11.7* 12.7*  NEUTROABS 15.8*  --   --   --  8.4*  HGB 13.5 11.9* 11.1* 10.1* 11.5*  HCT 43.4 36.3* 34.7* 32.1* 35.8*  MCV 103.6* 98.4 99.1 100.0 98.4  PLT 279 255 251 250 [id number]   Basic  Metabolic Panel: Recent Labs  Lab 03/30/21 1740 03/31/21 0557 04/03/21 0640  NA 136 135 134*  K 5.6* 4.9 4.1  CL 108 106 103  CO2 20* 22 25  GLUCOSE 132* 114* 153*  BUN '13 12 11  '$ CREATININE 0.84 0.75 0.94  CALCIUM 9.4 9.5 9.6   GFR: Estimated Creatinine Clearance: 54.4 mL/min (by C-G formula based on SCr of 0.94 mg/dL). Liver Function Tests: Recent Labs  Lab 03/30/21 1740  AST 54*  ALT 21  ALKPHOS 130*  BILITOT 1.5*  PROT 6.2*  ALBUMIN 2.9*   Recent Labs  Lab 03/30/21 1740  LIPASE 39   No results for input(s): AMMONIA in the last 168 hours. Coagulation Profile: No results for input(s): INR, PROTIME in the last 168 hours. Cardiac Enzymes: No results for input(s): CKTOTAL, CKMB, CKMBINDEX, TROPONINI in the last 168 hours. BNP (last 3 results) No results for input(s): PROBNP in the last 8760 hours. HbA1C: Recent Labs    04/02/21 1200  HGBA1C 7.5*   CBG: Recent Labs  Lab 04/02/21 1636 04/02/21 2056 04/03/21 0729 04/03/21  1213  GLUCAP 231* 119* 144* 168*   Lipid Profile: No results for input(s): CHOL, HDL, LDLCALC, TRIG, CHOLHDL, LDLDIRECT in the last 72 hours. Thyroid Function Tests: No results for input(s): TSH, T4TOTAL, FREET4, T3FREE, THYROIDAB in the last 72 hours. Anemia Panel: No results for input(s): VITAMINB12, FOLATE, FERRITIN, TIBC, IRON, RETICCTPCT in the last 72 hours. Sepsis Labs: No results for input(s): PROCALCITON, LATICACIDVEN in the last 168 hours.  Recent Results (from the past 240 hour(s))  SARS CORONAVIRUS 2 (TAT 6-24 HRS) Nasopharyngeal Nasopharyngeal Swab     Status: None   Collection Time: 03/30/21  8:55 PM   Specimen: Nasopharyngeal Swab  Result Value Ref Range Status   SARS Coronavirus 2 NEGATIVE NEGATIVE Final    Comment: (NOTE) SARS-CoV-2 target nucleic acids are NOT DETECTED.  The SARS-CoV-2 RNA is generally detectable in upper and lower respiratory specimens during the acute phase of infection. Negative results do  not preclude SARS-CoV-2 infection, do not rule out co-infections with other pathogens, and should not be used as the sole basis for treatment or other patient management decisions. Negative results must be combined with clinical observations, patient history, and epidemiological information. The expected result is Negative.  Fact Sheet for Patients: SugarRoll.be  Fact Sheet for Healthcare Providers: https://www.woods-mathews.com/  This test is not yet approved or cleared by the [place] FDA and  has been authorized for detection and/or diagnosis of SARS-CoV-2 by FDA under an Emergency Use Authorization (EUA). This EUA will remain  in effect (meaning this test can be used) for the duration of the COVID-19 declaration under Se ction 564(b)(1) of the Act, 21 U.S.C. section 360bbb-3(b)(1), unless the authorization is terminated or revoked sooner.  Performed at [facility], [place] [address], [place], [place] [address]   MRSA Next Gen by PCR, Nasal     Status: None   Collection Time: 03/31/21  9:30 PM   Specimen: Nasal Mucosa; Nasal Swab  Result Value Ref Range Status   MRSA by PCR Next Gen NOT DETECTED NOT DETECTED Final    Comment: (NOTE) The GeneXpert MRSA Assay (FDA approved for NASAL specimens only), is one component of a comprehensive MRSA colonization surveillance program. It is not intended to diagnose MRSA infection nor to guide or monitor treatment for MRSA infections. Test performance is not FDA approved in patients less than 71 years old. Performed at [facility], [place] [address], [place], [place] [address]     Radiology Studies: CT ABDOMEN PELVIS WO CONTRAST  Result Date: 04/02/2021 CLINICAL DATA:  Abdominal pain acute peritonitis due to dislodged biliary drain. EXAM: CT ABDOMEN AND PELVIS WITHOUT CONTRAST TECHNIQUE: Multidetector CT imaging of the abdomen and pelvis was performed following the  standard protocol without IV contrast. COMPARISON:  March 30, 2021 FINDINGS: Lower chest: Right basilar consolidation with small right pleural effusion. Tiny left pleural effusion. Bibasilar fibrotic lung changes. Aortic atherosclerosis. Coronary artery calcifications. Mitral annulus calcifications. Hepatobiliary: Unremarkable noncontrast appearance of the hepatic parenchyma. Pneumobilia with percutaneous biliary drainage catheter and pigtail coiled in the duodenum. Postsurgical change of cholecystectomy with small volume of fluid in the surgical bed. Central area of high density on image with surrounding soft tissue and mild inflammation measuring 11 mm along the inferior margin of the right lobe of the liver 32/2, slightly more prominent than on prior CT and new from CT February 18, 2021, possibly reflecting inflammation about a dropped gallstone. No walled off fluid collections. Pancreas: Scattered dystrophic pancreatic calcifications sequela of chronic  pancreatitis. No pancreatic ductal dilation. Spleen: Within normal limits. Adrenals/Urinary Tract: Bilateral adrenal glands are unremarkable. No hydronephrosis. Calcifications in the bilateral renal hila are favored to be vascular in nature. Urinary bladder is unremarkable for degree of distension. Stomach/Bowel: Stomach is unremarkable for degree of distension. No pathologic dilation of small or large bowel. The appendix and terminal ileum appear normal. Colonic diverticulosis without findings of acute diverticulitis. Vascular/Lymphatic: Severe aortic and branch vessel atherosclerosis without abdominal aortic aneurysm. No pathologically enlarged abdominal or pelvic lymph nodes. Reproductive: Dystrophic prostatic calcifications within an enlarged prostate gland. Other: No significant abdominopelvic ascites or peritoneal thickening. No pneumoperitoneum. Musculoskeletal: Severe multilevel degenerative changes spine. Chronic bilateral L5 pars defects with grade 1 L5 on  S1 anterolisthesis. Degenerative changes bilateral hips. Degenerative changes bilateral SI joints. No acute osseous abnormality. IMPRESSION: 1. Postsurgical change of cholecystectomy with similar stranding in the surgical bed. No walled off fluid collections. 2. Central area of high density with surrounding soft tissue and mild inflammation measuring 11 mm along the inferior margin of the right lobe of the liver, slightly more prominent than on prior CT and new from CT February 18, 2021, possibly reflecting inflammation about a dropped gallstone. 3. No significant abdominopelvic ascites or peritoneal thickening visualized. 4. Similar positioning of the percutaneous biliary drainage catheter with pigtail coiled in the duodenum. 5. Small right greater than left pleural effusions with a right basilar consolidation possibly reflecting infection or atelectasis. 6. Colonic diverticulosis without findings of acute diverticulitis. 7.  Aortic Atherosclerosis (ICD10-I70.0). Electronically Signed   By: Dahlia Bailiff M.D.   On: 04/02/2021 18:00     Scheduled Meds:  (feeding supplement) PROSource Plus  30 mL Oral BID BM   apixaban  5 mg Oral BID   feeding supplement  1 Container Oral TID BM   insulin aspart  0-9 Units Subcutaneous TID WC   multivitamin with minerals  1 tablet Oral Daily   pantoprazole  40 mg Oral Daily   phenazopyridine  100 mg Oral TID WC   tamsulosin  0.4 mg Oral Daily   Continuous Infusions:  sodium chloride Stopped (03/31/21 2120)   lactated ringers 75 mL/hr at 04/03/21 1155   piperacillin-tazobactam (ZOSYN)  IV 3.375 g (04/03/21 1252)     LOS: 3 days    Time spent: [place], MD [facility]   If 7PM-7AM, please contact night-coverage

## 2021-04-04 LAB — HEMOGLOBIN AND HEMATOCRIT, BLOOD
HCT: 35.5 % — ABNORMAL LOW (ref 39.0–52.0)
Hemoglobin: 11.4 g/dL — ABNORMAL LOW (ref 13.0–17.0)

## 2021-04-04 LAB — CBC
HCT: 28.7 % — ABNORMAL LOW (ref 39.0–52.0)
Hemoglobin: 9.3 g/dL — ABNORMAL LOW (ref 13.0–17.0)
MCH: 31.8 pg (ref 26.0–34.0)
MCHC: 32.4 g/dL (ref 30.0–36.0)
MCV: 98.3 fL (ref 80.0–100.0)
Platelets: 317 10*3/uL (ref 150–400)
RBC: 2.92 MIL/uL — ABNORMAL LOW (ref 4.22–5.81)
RDW: 17.2 % — ABNORMAL HIGH (ref 11.5–15.5)
WBC: 11.1 10*3/uL — ABNORMAL HIGH (ref 4.0–10.5)
nRBC: 0 % (ref 0.0–0.2)

## 2021-04-04 LAB — GLUCOSE, CAPILLARY
Glucose-Capillary: 166 mg/dL — ABNORMAL HIGH (ref 70–99)
Glucose-Capillary: 191 mg/dL — ABNORMAL HIGH (ref 70–99)
Glucose-Capillary: 194 mg/dL — ABNORMAL HIGH (ref 70–99)
Glucose-Capillary: 246 mg/dL — ABNORMAL HIGH (ref 70–99)

## 2021-04-04 MED ORDER — OXYCODONE-ACETAMINOPHEN 5-325 MG PO TABS
1.0000 | ORAL_TABLET | Freq: Four times a day (QID) | ORAL | Status: DC | PRN
  Administered 2021-04-04: 1 via ORAL
  Filled 2021-04-04: qty 1

## 2021-04-04 NOTE — Progress Notes (Signed)
PROGRESS NOTE    William Buchanan  [id number] DOB: 08/29/1933 DOA: 03/30/2021 PCP: Leota Jacobsen, MD    Brief Narrative:  This 87 years old male with PMH significant for hypertension, DM type II with peripheral neuropathy and vascular disease, COPD, BPH, s/p left AKA who was recently admitted to [facility] for severe sepsis and gangrenous cholecystitis.  Patient underwent  cholecystostomy and had a percutaneous biliary drain placed and he was discharged to skilled nursing facility.  He was in his wheelchair,  he bumped something and lost his balance and he fell and he did land on his abdomen and percutaneous biliary drain became dislodged and pulled out part away.  Patient has developed abdominal pain and was sent in the ED.  CT scan revealed biliary drain is dislodged and now in the right lobe of liver and not in the gallbladder fossa where it was initially placed.  Patient denies any head injury or loss of consciousness.  Patient has chronic pain in the left AKA stump which is unchanged. Patient is admitted for dislodged biliary drain.  Patient underwent successful rescue of dislodged biliary drain with removal of partially displaced 8.5 [place] biliary drainage catheter and placement of a new 10.2 [place] internal/external biliary drain on 8/31.  Assessment & Plan:   Principal Problem:   Abdominal pain Active Problems:   Essential hypertension   Severe protein-calorie malnutrition (HCC)   Type 2 diabetes mellitus with diabetic polyneuropathy, without long-term current use of insulin (HCC)   Migration of percutaneous transhepatic biliary drain catheter   S/P AKA (above knee amputation) unilateral, left ([place])   Fall from wheelchair   Leukocytosis   COPD (chronic obstructive pulmonary disease) ([place])   Hyperkalemia   Pulmonary embolism (HCC)  Abdominal pain /sepsis secondary to peritonitis from dislodged biliary drain.: Patient has fallen from wheelchair at skilled nursing  facility and has dislodged his percutaneous biliary catheter. Continue adequate pain control with Dilaudid. CT abdomen shows no fractures of the ribs or injuries to the internal organs, no free air or ascites. Patient presented with  tachycardia, tachypnea, leukocytosis, hypotension, lactic acid normal.. Continue IV hydration with Ringer's lactate.  Continue IV Zosyn. IR consulted for replacement of percutaneous biliary drain. Patient underwent successful rescue of the partially dislodged biliary drain 8/31. Continue current management and follow-up with [facility] medical team.  Essential hypertension: Lisinopril is on hold due to hypotension.   Resume when blood pressure improves.  Type 2 diabetes with diabetic polyneuropathy: Continue Actos, continue sliding scale.  Hyperkalemia :  Patient was given Lokelma once,  potassium is improved.  COPD / Hx. of PE:  Not in any acute exacerbation. Continue DuoNeb as needed,   Continue supplemental oxygen to keep saturation above 92 to 96%. Continue Eliquis for PE.  Left AKA /Ambulatory dysfunction: Stable. continue pain medications. Plan to return to SNF when medically stable.   DVT prophylaxis:  SCDs Code Status full code Family Communication: No family at bedside Disposition Plan:   Status is: Inpatient  Remains inpatient appropriate because:Inpatient level of care appropriate due to severity of illness  Dispo: The patient is from: SNF              Anticipated d/c is to: SNF              Patient currently is not medically stable to d/c.   Difficult to place patient No  Consultants:  IR.  Procedures: Rescue of partially dislodged biliary drain. Antimicrobials:  Anti-infectives (From admission, onward)    Start     Dose/Rate Route Frequency Ordered Stop   03/31/21 0400  piperacillin-tazobactam (ZOSYN) IVPB 3.375 g        3.375 g 12.5 mL/hr over 240 Minutes Intravenous Every 8 hours 03/30/21 2130     03/30/21  2100  piperacillin-tazobactam (ZOSYN) IVPB 3.375 g        3.375 g 100 mL/hr over 30 Minutes Intravenous  Once 03/30/21 2053 03/30/21 2137       Subjective: Patient was seen and examined at bedside. No overnight events. Patient underwent successful rescue of partially dislodged biliary drain.  Biliary drain is improved. Patient reports abdominal pain has completely resolved.  Objective: Vitals:   04/03/21 1531 04/03/21 1700 04/03/21 2034 04/04/21 0438  BP:   (!) 118/95 114/78  Pulse: (!) 101  (!) 48 81  Resp:   14 16  Temp:   98.1 F (36.7 C) 98.2 F (36.8 C)  TempSrc:   Oral Oral  SpO2:   99% 100%  Weight:  72 kg    Height:        Intake/Output Summary (Last 24 hours) at 04/04/2021 1424 Last data filed at 04/04/2021 0934 Gross per 24 hour  Intake 240 ml  Output 450 ml  Net -210 ml   Filed Weights   03/30/21 1530 04/03/21 1700  Weight: 65.8 kg 72 kg    Examination:  General exam: Appears comfortable, not in any acute distress. Respiratory system: Clear to auscultation bilaterally, respiratory effort normal. Cardiovascular system: S1-S2 heard, regular rate and rhythm, no murmur. Gastrointestinal system: Abdomen is soft, non tender,  nondistended, biliary drain with good output noted.   Central nervous system: Alert and oriented x 2. No focal neurological deficits. Extremities: Left AKA, tenderness-, no signs of infection. Skin: No rashes, lesions or ulcers Psychiatry: Judgement and insight appear normal. Mood & affect appropriate.     Data Reviewed: I have personally reviewed following labs and imaging studies  CBC: Recent Labs  Lab 03/30/21 1626 03/31/21 0557 04/01/21 0621 04/02/21 0549 04/03/21 0640 04/04/21 0612  WBC 17.9* 18.7* 15.3* 11.7* 12.7* 11.1*  NEUTROABS 15.8*  --   --   --  8.4*  --   HGB 13.5 11.9* 11.1* 10.1* 11.5* 9.3*  HCT 43.4 36.3* 34.7* 32.1* 35.8* 28.7*  MCV 103.6* 98.4 99.1 100.0 98.4 98.3  PLT 279 255 251 250 316 [id number]   Basic  Metabolic Panel: Recent Labs  Lab 03/30/21 1740 03/31/21 0557 04/03/21 0640  NA 136 135 134*  K 5.6* 4.9 4.1  CL 108 106 103  CO2 20* 22 25  GLUCOSE 132* 114* 153*  BUN '13 12 11  '$ CREATININE 0.84 0.75 0.94  CALCIUM 9.4 9.5 9.6   GFR: Estimated Creatinine Clearance: 58.5 mL/min (by C-G formula based on SCr of 0.94 mg/dL). Liver Function Tests: Recent Labs  Lab 03/30/21 1740  AST 54*  ALT 21  ALKPHOS 130*  BILITOT 1.5*  PROT 6.2*  ALBUMIN 2.9*   Recent Labs  Lab 03/30/21 1740  LIPASE 39   No results for input(s): AMMONIA in the last 168 hours. Coagulation Profile: No results for input(s): INR, PROTIME in the last 168 hours. Cardiac Enzymes: No results for input(s): CKTOTAL, CKMB, CKMBINDEX, TROPONINI in the last 168 hours. BNP (last 3 results) No results for input(s): PROBNP in the last 8760 hours. HbA1C: Recent Labs    04/02/21 1200  HGBA1C 7.5*   CBG: Recent Labs  Lab 04/03/21 1213 04/03/21  1733 04/03/21 2036 04/04/21 0735 04/04/21 1155  GLUCAP 168* 291* 125* 166* 191*   Lipid Profile: No results for input(s): CHOL, HDL, LDLCALC, TRIG, CHOLHDL, LDLDIRECT in the last 72 hours. Thyroid Function Tests: No results for input(s): TSH, T4TOTAL, FREET4, T3FREE, THYROIDAB in the last 72 hours. Anemia Panel: No results for input(s): VITAMINB12, FOLATE, FERRITIN, TIBC, IRON, RETICCTPCT in the last 72 hours. Sepsis Labs: No results for input(s): PROCALCITON, LATICACIDVEN in the last 168 hours.  Recent Results (from the past 240 hour(s))  SARS CORONAVIRUS 2 (TAT 6-24 HRS) Nasopharyngeal Nasopharyngeal Swab     Status: None   Collection Time: 03/30/21  8:55 PM   Specimen: Nasopharyngeal Swab  Result Value Ref Range Status   SARS Coronavirus 2 NEGATIVE NEGATIVE Final    Comment: (NOTE) SARS-CoV-2 target nucleic acids are NOT DETECTED.  The SARS-CoV-2 RNA is generally detectable in upper and lower respiratory specimens during the acute phase of infection.  Negative results do not preclude SARS-CoV-2 infection, do not rule out co-infections with other pathogens, and should not be used as the sole basis for treatment or other patient management decisions. Negative results must be combined with clinical observations, patient history, and epidemiological information. The expected result is Negative.  Fact Sheet for Patients: SugarRoll.be  Fact Sheet for Healthcare Providers: https://www.woods-mathews.com/  This test is not yet approved or cleared by the [place] FDA and  has been authorized for detection and/or diagnosis of SARS-CoV-2 by FDA under an Emergency Use Authorization (EUA). This EUA will remain  in effect (meaning this test can be used) for the duration of the COVID-19 declaration under Se ction 564(b)(1) of the Act, 21 U.S.C. section 360bbb-3(b)(1), unless the authorization is terminated or revoked sooner.  Performed at [facility], [place] [address] [place], [place] [address]   MRSA Next Gen by PCR, Nasal     Status: None   Collection Time: 03/31/21  9:30 PM   Specimen: Nasal Mucosa; Nasal Swab  Result Value Ref Range Status   MRSA by PCR Next Gen NOT DETECTED NOT DETECTED Final    Comment: (NOTE) The GeneXpert MRSA Assay (FDA approved for NASAL specimens only), is one component of a comprehensive MRSA colonization surveillance program. It is not intended to diagnose MRSA infection nor to guide or monitor treatment for MRSA infections. Test performance is not FDA approved in patients less than 57 years old. Performed at [facility], [place] [address], [place], [place] [address]     Radiology Studies: CT ABDOMEN PELVIS WO CONTRAST  Result Date: 04/02/2021 CLINICAL DATA:  Abdominal pain acute peritonitis due to dislodged biliary drain. EXAM: CT ABDOMEN AND PELVIS WITHOUT CONTRAST TECHNIQUE: Multidetector CT imaging of the abdomen and pelvis was performed  following the standard protocol without IV contrast. COMPARISON:  March 30, 2021 FINDINGS: Lower chest: Right basilar consolidation with small right pleural effusion. Tiny left pleural effusion. Bibasilar fibrotic lung changes. Aortic atherosclerosis. Coronary artery calcifications. Mitral annulus calcifications. Hepatobiliary: Unremarkable noncontrast appearance of the hepatic parenchyma. Pneumobilia with percutaneous biliary drainage catheter and pigtail coiled in the duodenum. Postsurgical change of cholecystectomy with small volume of fluid in the surgical bed. Central area of high density on image with surrounding soft tissue and mild inflammation measuring 11 mm along the inferior margin of the right lobe of the liver 32/2, slightly more prominent than on prior CT and new from CT February 18, 2021, possibly reflecting inflammation about a dropped gallstone. No walled off fluid collections. Pancreas:  Scattered dystrophic pancreatic calcifications sequela of chronic pancreatitis. No pancreatic ductal dilation. Spleen: Within normal limits. Adrenals/Urinary Tract: Bilateral adrenal glands are unremarkable. No hydronephrosis. Calcifications in the bilateral renal hila are favored to be vascular in nature. Urinary bladder is unremarkable for degree of distension. Stomach/Bowel: Stomach is unremarkable for degree of distension. No pathologic dilation of small or large bowel. The appendix and terminal ileum appear normal. Colonic diverticulosis without findings of acute diverticulitis. Vascular/Lymphatic: Severe aortic and branch vessel atherosclerosis without abdominal aortic aneurysm. No pathologically enlarged abdominal or pelvic lymph nodes. Reproductive: Dystrophic prostatic calcifications within an enlarged prostate gland. Other: No significant abdominopelvic ascites or peritoneal thickening. No pneumoperitoneum. Musculoskeletal: Severe multilevel degenerative changes spine. Chronic bilateral L5 pars defects with  grade 1 L5 on S1 anterolisthesis. Degenerative changes bilateral hips. Degenerative changes bilateral SI joints. No acute osseous abnormality. IMPRESSION: 1. Postsurgical change of cholecystectomy with similar stranding in the surgical bed. No walled off fluid collections. 2. Central area of high density with surrounding soft tissue and mild inflammation measuring 11 mm along the inferior margin of the right lobe of the liver, slightly more prominent than on prior CT and new from CT February 18, 2021, possibly reflecting inflammation about a dropped gallstone. 3. No significant abdominopelvic ascites or peritoneal thickening visualized. 4. Similar positioning of the percutaneous biliary drainage catheter with pigtail coiled in the duodenum. 5. Small right greater than left pleural effusions with a right basilar consolidation possibly reflecting infection or atelectasis. 6. Colonic diverticulosis without findings of acute diverticulitis. 7.  Aortic Atherosclerosis (ICD10-I70.0). Electronically Signed   By: Dahlia Bailiff M.D.   On: 04/02/2021 18:00     Scheduled Meds:  (feeding supplement) PROSource Plus  30 mL Oral BID BM   apixaban  5 mg Oral BID   feeding supplement  1 Container Oral TID BM   insulin aspart  0-9 Units Subcutaneous TID WC   multivitamin with minerals  1 tablet Oral Daily   pantoprazole  40 mg Oral Daily   phenazopyridine  100 mg Oral TID WC   tamsulosin  0.4 mg Oral Daily   Continuous Infusions:  sodium chloride Stopped (03/31/21 2120)   lactated ringers 75 mL/hr at 04/03/21 1155   piperacillin-tazobactam (ZOSYN)  IV 3.375 g (04/04/21 0555)     LOS: 4 days    Time spent: [place], MD [facility]   If 7PM-7AM, please contact night-coverage

## 2021-04-05 DIAGNOSIS — W050XXA Fall from non-moving wheelchair, initial encounter: Secondary | ICD-10-CM

## 2021-04-05 DIAGNOSIS — I2782 Chronic pulmonary embolism: Secondary | ICD-10-CM

## 2021-04-05 DIAGNOSIS — E1142 Type 2 diabetes mellitus with diabetic polyneuropathy: Secondary | ICD-10-CM

## 2021-04-05 DIAGNOSIS — J449 Chronic obstructive pulmonary disease, unspecified: Secondary | ICD-10-CM

## 2021-04-05 DIAGNOSIS — A419 Sepsis, unspecified organism: Secondary | ICD-10-CM

## 2021-04-05 DIAGNOSIS — E43 Unspecified severe protein-calorie malnutrition: Secondary | ICD-10-CM

## 2021-04-05 DIAGNOSIS — D72825 Bandemia: Secondary | ICD-10-CM

## 2021-04-05 LAB — CBC
HCT: 31.7 % — ABNORMAL LOW (ref 39.0–52.0)
Hemoglobin: 10.4 g/dL — ABNORMAL LOW (ref 13.0–17.0)
MCH: 31.6 pg (ref 26.0–34.0)
MCHC: 32.8 g/dL (ref 30.0–36.0)
MCV: 96.4 fL (ref 80.0–100.0)
Platelets: 400 10*3/uL (ref 150–400)
RBC: 3.29 MIL/uL — ABNORMAL LOW (ref 4.22–5.81)
RDW: 17.2 % — ABNORMAL HIGH (ref 11.5–15.5)
WBC: 11 10*3/uL — ABNORMAL HIGH (ref 4.0–10.5)
nRBC: 0 % (ref 0.0–0.2)

## 2021-04-05 LAB — BASIC METABOLIC PANEL
Anion gap: 7 (ref 5–15)
BUN: 9 mg/dL (ref 8–23)
CO2: 26 mmol/L (ref 22–32)
Calcium: 9.3 mg/dL (ref 8.9–10.3)
Chloride: 103 mmol/L (ref 98–111)
Creatinine, Ser: 0.85 mg/dL (ref 0.61–1.24)
GFR, Estimated: >60 mL/min (ref >60)
Glucose, Bld: 232 mg/dL — ABNORMAL HIGH (ref 70–99)
Potassium: 3.2 mmol/L — ABNORMAL LOW (ref 3.5–5.1)
Sodium: 136 mmol/L (ref 135–145)

## 2021-04-05 LAB — GLUCOSE, CAPILLARY
Glucose-Capillary: 200 mg/dL — ABNORMAL HIGH (ref 70–99)
Glucose-Capillary: 233 mg/dL — ABNORMAL HIGH (ref 70–99)
Glucose-Capillary: 204 mg/dL — ABNORMAL HIGH (ref 70–99)
Glucose-Capillary: 78 mg/dL (ref 70–99)

## 2021-04-05 LAB — SARS CORONAVIRUS 2 (TAT 6-24 HRS): SARS Coronavirus 2: NEGATIVE

## 2021-04-05 LAB — MAGNESIUM: Magnesium: 1.6 mg/dL — ABNORMAL LOW (ref 1.7–2.4)

## 2021-04-05 MED ORDER — INSULIN ASPART 100 UNIT/ML IJ SOLN
0.0000 [IU] | Freq: Three times a day (TID) | INTRAMUSCULAR | Status: DC
  Administered 2021-04-05: 5 [IU] via SUBCUTANEOUS
  Administered 2021-04-06 – 2021-04-07 (×2): 3 [IU] via SUBCUTANEOUS
  Administered 2021-04-07: 2 [IU] via SUBCUTANEOUS
  Administered 2021-04-08: 3 [IU] via SUBCUTANEOUS
  Administered 2021-04-08: 2 [IU] via SUBCUTANEOUS

## 2021-04-05 MED ORDER — INSULIN GLARGINE-YFGN 100 UNIT/ML ~~LOC~~ SOLN
5.0000 [IU] | Freq: Two times a day (BID) | SUBCUTANEOUS | Status: DC
  Administered 2021-04-05 – 2021-04-08 (×7): 5 [IU] via SUBCUTANEOUS
  Filled 2021-04-05 (×7): qty 0.05

## 2021-04-05 MED ORDER — SODIUM CHLORIDE 0.9 % IV SOLN
3.0000 g | Freq: Four times a day (QID) | INTRAVENOUS | Status: DC
  Administered 2021-04-05 – 2021-04-06 (×4): 3 g via INTRAVENOUS
  Filled 2021-04-05: qty 8
  Filled 2021-04-05 (×2): qty 3
  Filled 2021-04-05: qty 8
  Filled 2021-04-05: qty 3

## 2021-04-05 MED ORDER — HYDROXYZINE HCL 25 MG PO TABS
25.0000 mg | ORAL_TABLET | Freq: Once | ORAL | Status: AC
  Administered 2021-04-05: 25 mg via ORAL
  Filled 2021-04-05: qty 1

## 2021-04-05 MED ORDER — INSULIN ASPART 100 UNIT/ML IJ SOLN: 0.0000 [IU] | Freq: Every day | INTRAMUSCULAR | Status: DC

## 2021-04-05 MED ORDER — FLUTICASONE FUROATE-VILANTEROL 100-25 MCG/INH IN AEPB
1.0000 | INHALATION_SPRAY | Freq: Every day | RESPIRATORY_TRACT | Status: DC
  Administered 2021-04-05: 1 via RESPIRATORY_TRACT
  Filled 2021-04-05: qty 28

## 2021-04-05 MED ORDER — FLUTICASONE FUROATE-VILANTEROL 100-25 MCG/INH IN AEPB
1.0000 | INHALATION_SPRAY | Freq: Every day | RESPIRATORY_TRACT | Status: DC
  Administered 2021-04-06 – 2021-04-08 (×3): 1 via RESPIRATORY_TRACT

## 2021-04-05 MED ORDER — OXYCODONE-ACETAMINOPHEN 5-325 MG PO TABS
1.0000 | ORAL_TABLET | Freq: Three times a day (TID) | ORAL | Status: DC | PRN
Start: 2021-04-05 — End: 2021-04-08
  Administered 2021-04-05 – 2021-04-08 (×9): 1 via ORAL
  Filled 2021-04-05 (×11): qty 1

## 2021-04-05 MED ORDER — HYDROMORPHONE HCL 1 MG/ML IJ SOLN
0.5000 mg | INTRAMUSCULAR | Status: DC | PRN
  Administered 2021-04-08: 0.5 mg via INTRAVENOUS
  Filled 2021-04-05: qty 0.5

## 2021-04-05 NOTE — Progress Notes (Signed)
PROGRESS NOTE  William Buchanan [id number] DOB: 06-May-1934   PCP: Leota Jacobsen, MD  Patient is from: SNF.  Left AKA.  Wheelchair-bound.  DOA: 03/30/2021 LOS: 5  Chief complaints:  Chief Complaint  Patient presents with   pulled billary tube out of right side     Brief Narrative / Interim history: 87 year old M with PMH of COPD and chronic hypoxic RF on 2 L at night, recent PE on Eliquis, DM-2 with neuropathy, PVD, left AKA, HTN and recent hospitalization at [facility] from 7/26-8/10  for severe sepsis due to gangrenous cholecystitis s/p PERC drain placement brought to ED with abdominal pain and drain dislodgment after "he bumped something and fell off his wheelchair".  Denies head injury or loss of consciousness.  CT revealed drain dislodgment with tip and right hepatic lobe.  He underwent successful replacement of biliary drain on 8/31.  He was started on IV Zosyn out of concern for peritonitis.  Of note, biliary culture at [facility] with pansensitive E. coli.  Does not seem he was discharged  Subjective: Seen and examined earlier this morning.  He is complaining of difficulty falling asleep and not getting his breathing treatments.  He reports shortness of breath although he does not seem to have work of breathing or respiratory distress on exam.  He denies chest pain or abdominal pain.  Denies UTI symptoms.  Looks anxious.  Objective: Vitals:   04/04/21 0438 04/04/21 1629 04/04/21 2038 04/05/21 0439  BP: 114/78 117/80 118/76 131/68  Pulse: 81 97 92 85  Resp: '16 18 18 18  '$ Temp: 98.2 F (36.8 C) 97.9 F (36.6 C) 99.1 F (37.3 C) 98.3 F (36.8 C)  TempSrc: Oral Oral Oral Oral  SpO2: 100% 93% 97% 95%  Weight:      Height:        Intake/Output Summary (Last 24 hours) at 04/05/2021 1322 Last data filed at 04/05/2021 1134 Gross per 24 hour  Intake 5130.64 ml  Output 2090 ml  Net 3040.64 ml   Filed Weights   03/30/21 1530 04/03/21 1700  Weight: 65.8 kg 72 kg     Examination:  GENERAL: No apparent distress.  Nontoxic. HEENT: MMM.  Vision and hearing grossly intact.  NECK: Supple.  No apparent JVD.  RESP: 95% on 2 L.  No IWOB.  Fair aeration bilaterally. CVS:  RRR. Heart sounds normal.  ABD/GI/GU: BS+. Abd soft, NTND.  Percutaneous drain with bilious output MSK/EXT:  Moves extremities. No apparent deformity.  Left AKA SKIN: no apparent skin lesion or wound NEURO: Awake, alert and oriented appropriately.  No apparent focal neuro deficit. PSYCH: Somewhat anxious.  Procedures:  8/31-biliary percutaneous drain replacement  Microbiology summarized: COVID-19 and influenza PCR nonreactive.   Assessment & Plan: Sepsis due to possible peritonitis/cholecystitis: POA.  Had leukocytosis, tachycardia and mild temp on presentation.  Patient with history of gangrenous cholecystitis status post percutaneous drainage at [facility].  Culture at [facility] with pansensitive E. coli.  He is admitted here with abdominal pain and drain dislodgment after accidental fall off his wheelchair. -Drain replaced by IR on 8/31. -IV Zosyn 8/30-9/5>>> IV Unasyn -We will discharge on p.o. Augmentin if continues to do well on IV Unasyn. -Outpatient follow-up with [facility] medical team on discharge  Abdominal pain: Due to the above.  Resolved.  Essential hypertension: Normotensive off home antihypertensive meds. -Resume home meds  Uncontrolled NIDDM-2 with hyperglycemia: On glipizide and Actos at home. Recent Labs  Lab 04/04/21 1155 04/04/21 1657  04/04/21 2040 04/05/21 0756 04/05/21 1218  GLUCAP 191* 194* 246* 200* 233*  -Increase SSI to moderate -Added basal insulin at 5 units twice daily  Subacute PE-recent diagnosis at [facility] on 7/21 -Continue home Eliquis  Chronic COPD-seems a stable. -Start Breo Ellipta -As needed DuoNeb   Hyperkalemia : Resolved  Hypokalemia: K3.2. -Replenish and recheck -Check Mg   Left AKA /Ambulatory dysfunction: Uses  wheelchair at baseline. -PT/OT eval to assess ability to transfer  BPH without LUTS -Continue home Flomax   Increased nutrition need Body mass index is 23.44 kg/m. Nutrition Problem: Inadequate oral intake Etiology: decreased appetite Signs/Symptoms: per patient/family report Interventions: Boost Breeze, Prostat, MVI   DVT prophylaxis:  SCDs Start: 03/30/21 2131 apixaban (ELIQUIS) tablet 5 mg  Code Status: Full code Family Communication: Patient and/or RN. Available if any question.  Level of care: Telemetry Status is: Inpatient  Remains inpatient appropriate because:Persistent severe electrolyte disturbances, Unsafe d/c plan, IV treatments appropriate due to intensity of illness or inability to take PO, and Inpatient level of care appropriate due to severity of illness  Dispo: The patient is from: SNF              Anticipated d/c is to: SNF              Patient currently is not medically stable to d/c.   Difficult to place patient No       Consultants:  IR   Sch Meds:  Scheduled Meds:  (feeding supplement) PROSource Plus  30 mL Oral BID BM   apixaban  5 mg Oral BID   feeding supplement  1 Container Oral TID BM   insulin aspart  0-15 Units Subcutaneous TID WC   insulin aspart  0-5 Units Subcutaneous QHS   insulin glargine-yfgn  5 Units Subcutaneous BID   multivitamin with minerals  1 tablet Oral Daily   pantoprazole  40 mg Oral Daily   tamsulosin  0.4 mg Oral Daily   Continuous Infusions:  sodium chloride Stopped (03/31/21 2120)   ampicillin-sulbactam (UNASYN) IV 3 g (04/05/21 1237)   PRN Meds:.sodium chloride, acetaminophen **OR** acetaminophen, HYDROmorphone (DILAUDID) injection, ipratropium-albuterol, melatonin, ondansetron **OR** ondansetron (ZOFRAN) IV, oxyCODONE-acetaminophen, senna-docusate  Antimicrobials: Anti-infectives (From admission, onward)    Start     Dose/Rate Route Frequency Ordered Stop   04/05/21 1200  Ampicillin-Sulbactam (UNASYN) 3 g  in sodium chloride 0.9 % 100 mL IVPB        3 g 200 mL/hr over 30 Minutes Intravenous Every 6 hours 04/05/21 0820 04/10/21 1159   03/31/21 0400  piperacillin-tazobactam (ZOSYN) IVPB 3.375 g  Status:  Discontinued        3.375 g 12.5 mL/hr over 240 Minutes Intravenous Every 8 hours 03/30/21 2130 04/05/21 0820   03/30/21 2100  piperacillin-tazobactam (ZOSYN) IVPB 3.375 g        3.375 g 100 mL/hr over 30 Minutes Intravenous  Once 03/30/21 2053 03/30/21 2137        I have personally reviewed the following labs and images: CBC: Recent Labs  Lab 03/30/21 1626 03/31/21 0557 04/01/21 0621 04/02/21 0549 04/03/21 0640 04/04/21 0612 04/04/21 1531 04/05/21 0454  WBC 17.9*   < > 15.3* 11.7* 12.7* 11.1*  --  11.0*  NEUTROABS 15.8*  --   --   --  8.4*  --   --   --   HGB 13.5   < > 11.1* 10.1* 11.5* 9.3* 11.4* 10.4*  HCT 43.4   < > 34.7* 32.1* 35.8* 28.7*  35.5* 31.7*  MCV 103.6*   < > 99.1 100.0 98.4 98.3  --  96.4  PLT 279   < > 251 250 316 317  --  400   < > = values in this interval not displayed.   BMP &GFR Recent Labs  Lab 03/30/21 1740 03/31/21 0557 04/03/21 0640 04/05/21 0454  NA 136 135 134* 136  K 5.6* 4.9 4.1 3.2*  CL 108 106 103 103  CO2 20* '22 25 26  '$ GLUCOSE 132* 114* 153* 232*  BUN '13 12 11 9  '$ CREATININE 0.84 0.75 0.94 0.85  CALCIUM 9.4 9.5 9.6 9.3   Estimated Creatinine Clearance: 64.7 mL/min (by C-G formula based on SCr of 0.85 mg/dL). Liver & Pancreas: Recent Labs  Lab 03/30/21 1740  AST 54*  ALT 21  ALKPHOS 130*  BILITOT 1.5*  PROT 6.2*  ALBUMIN 2.9*   Recent Labs  Lab 03/30/21 1740  LIPASE 39   No results for input(s): AMMONIA in the last 168 hours. Diabetic: No results for input(s): HGBA1C in the last 72 hours. Recent Labs  Lab 04/04/21 1155 04/04/21 1657 04/04/21 2040 04/05/21 0756 04/05/21 1218  GLUCAP 191* 194* 246* 200* 233*   Cardiac Enzymes: No results for input(s): CKTOTAL, CKMB, CKMBINDEX, TROPONINI in the last 168 hours. No  results for input(s): PROBNP in the last 8760 hours. Coagulation Profile: No results for input(s): INR, PROTIME in the last 168 hours. Thyroid Function Tests: No results for input(s): TSH, T4TOTAL, FREET4, T3FREE, THYROIDAB in the last 72 hours. Lipid Profile: No results for input(s): CHOL, HDL, LDLCALC, TRIG, CHOLHDL, LDLDIRECT in the last 72 hours. Anemia Panel: No results for input(s): VITAMINB12, FOLATE, FERRITIN, TIBC, IRON, RETICCTPCT in the last 72 hours. Urine analysis:    Component Value Date/Time   COLORURINE YELLOW (A) 04/03/2021 1545   APPEARANCEUR CLEAR (A) 04/03/2021 1545   LABSPEC 1.025 04/03/2021 1545   PHURINE 6.0 04/03/2021 1545   GLUCOSEU NEGATIVE 04/03/2021 1545   HGBUR NEGATIVE 04/03/2021 1545   BILIRUBINUR NEGATIVE 04/03/2021 1545   KETONESUR 40 (A) 04/03/2021 1545   PROTEINUR TRACE (A) 04/03/2021 1545   NITRITE NEGATIVE 04/03/2021 1545   LEUKOCYTESUR NEGATIVE 04/03/2021 1545   Sepsis Labs: Invalid input(s): PROCALCITONIN, [place]  Microbiology: Recent Results (from the past 240 hour(s))  SARS CORONAVIRUS 2 (TAT 6-24 HRS) Nasopharyngeal Nasopharyngeal Swab     Status: None   Collection Time: 03/30/21  8:55 PM   Specimen: Nasopharyngeal Swab  Result Value Ref Range Status   SARS Coronavirus 2 NEGATIVE NEGATIVE Final    Comment: (NOTE) SARS-CoV-2 target nucleic acids are NOT DETECTED.  The SARS-CoV-2 RNA is generally detectable in upper and lower respiratory specimens during the acute phase of infection. Negative results do not preclude SARS-CoV-2 infection, do not rule out co-infections with other pathogens, and should not be used as the sole basis for treatment or other patient management decisions. Negative results must be combined with clinical observations, patient history, and epidemiological information. The expected result is Negative.  Fact Sheet for Patients: SugarRoll.be  Fact Sheet for Healthcare  Providers: https://www.woods-mathews.com/  This test is not yet approved or cleared by the [place] FDA and  has been authorized for detection and/or diagnosis of SARS-CoV-2 by FDA under an Emergency Use Authorization (EUA). This EUA will remain  in effect (meaning this test can be used) for the duration of the COVID-19 declaration under Se ction 564(b)(1) of the Act, 21 U.S.C. section 360bbb-3(b)(1), unless the authorization is terminated or revoked  sooner.  Performed at [facility], [place] [address], [place], [place] [address]   MRSA Next Gen by PCR, Nasal     Status: None   Collection Time: 03/31/21  9:30 PM   Specimen: Nasal Mucosa; Nasal Swab  Result Value Ref Range Status   MRSA by PCR Next Gen NOT DETECTED NOT DETECTED Final    Comment: (NOTE) The GeneXpert MRSA Assay (FDA approved for NASAL specimens only), is one component of a comprehensive MRSA colonization surveillance program. It is not intended to diagnose MRSA infection nor to guide or monitor treatment for MRSA infections. Test performance is not FDA approved in patients less than 38 years old. Performed at [facility], [place] [address], [place], [place] [address]     Radiology Studies: No results found.   Cortni Tays T. [place]  If 7PM-7AM, please contact night-coverage [email or web address] 04/05/2021, 1:22 PM

## 2021-04-05 NOTE — Progress Notes (Signed)
COVID test completed and taken to lab.

## 2021-04-05 NOTE — Care Management Important Message (Signed)
Important Message  Patient Details IM Letter placed in the Patient's room. Name: William Buchanan MRN: [id number] Date of Birth: [birth year]   Medicare Important Message Given:  Yes     Kerin Salen 04/05/2021, 11:26 AM

## 2021-04-06 DIAGNOSIS — E876 Hypokalemia: Secondary | ICD-10-CM

## 2021-04-06 LAB — GLUCOSE, CAPILLARY
Glucose-Capillary: 70 mg/dL (ref 70–99)
Glucose-Capillary: 129 mg/dL — ABNORMAL HIGH (ref 70–99)
Glucose-Capillary: 119 mg/dL — ABNORMAL HIGH (ref 70–99)
Glucose-Capillary: 168 mg/dL — ABNORMAL HIGH (ref 70–99)
Glucose-Capillary: 139 mg/dL — ABNORMAL HIGH (ref 70–99)
Glucose-Capillary: 57 mg/dL — ABNORMAL LOW (ref 70–99)
Glucose-Capillary: 62 mg/dL — ABNORMAL LOW (ref 70–99)
Glucose-Capillary: 154 mg/dL — ABNORMAL HIGH (ref 70–99)

## 2021-04-06 LAB — CBC WITH DIFFERENTIAL/PLATELET
Abs Immature Granulocytes: 0.14 10*3/uL — ABNORMAL HIGH (ref 0.00–0.07)
Basophils Absolute: 0.1 10*3/uL (ref 0.0–0.1)
Basophils Relative: 1 %
Eosinophils Absolute: 0.4 10*3/uL (ref 0.0–0.5)
Eosinophils Relative: 4 %
HCT: 32.5 % — ABNORMAL LOW (ref 39.0–52.0)
Hemoglobin: 10.5 g/dL — ABNORMAL LOW (ref 13.0–17.0)
Immature Granulocytes: 1 %
Lymphocytes Relative: 22 %
Lymphs Abs: 2.4 10*3/uL (ref 0.7–4.0)
MCH: 31.2 pg (ref 26.0–34.0)
MCHC: 32.3 g/dL (ref 30.0–36.0)
MCV: 96.4 fL (ref 80.0–100.0)
Monocytes Absolute: 1.3 10*3/uL — ABNORMAL HIGH (ref 0.1–1.0)
Monocytes Relative: 12 %
Neutro Abs: 6.6 10*3/uL (ref 1.7–7.7)
Neutrophils Relative %: 60 %
Platelets: 495 10*3/uL — ABNORMAL HIGH (ref 150–400)
RBC: 3.37 MIL/uL — ABNORMAL LOW (ref 4.22–5.81)
RDW: 17.2 % — ABNORMAL HIGH (ref 11.5–15.5)
WBC: 10.9 10*3/uL — ABNORMAL HIGH (ref 4.0–10.5)
nRBC: 0 % (ref 0.0–0.2)

## 2021-04-06 LAB — RENAL FUNCTION PANEL
Albumin: 2 g/dL — ABNORMAL LOW (ref 3.5–5.0)
Anion gap: 9 (ref 5–15)
BUN: 7 mg/dL — ABNORMAL LOW (ref 8–23)
CO2: 27 mmol/L (ref 22–32)
Calcium: 9.1 mg/dL (ref 8.9–10.3)
Chloride: 101 mmol/L (ref 98–111)
Creatinine, Ser: 0.71 mg/dL (ref 0.61–1.24)
GFR, Estimated: >60 mL/min (ref >60)
Glucose, Bld: 140 mg/dL — ABNORMAL HIGH (ref 70–99)
Phosphorus: 2.3 mg/dL — ABNORMAL LOW (ref 2.5–4.6)
Potassium: 3.1 mmol/L — ABNORMAL LOW (ref 3.5–5.1)
Sodium: 137 mmol/L (ref 135–145)

## 2021-04-06 LAB — MAGNESIUM: Magnesium: 1.6 mg/dL — ABNORMAL LOW (ref 1.7–2.4)

## 2021-04-06 MED ORDER — DEXTROSE 50 % IV SOLN
INTRAVENOUS | Status: AC
  Administered 2021-04-06: 50 mL
  Filled 2021-04-06: qty 50

## 2021-04-06 MED ORDER — AMOXICILLIN-POT CLAVULANATE 875-125 MG PO TABS
1.0000 | ORAL_TABLET | Freq: Two times a day (BID) | ORAL | Status: DC
  Administered 2021-04-06 – 2021-04-08 (×5): 1 via ORAL
  Filled 2021-04-06 (×5): qty 1

## 2021-04-06 MED ORDER — AMOXICILLIN-POT CLAVULANATE 875-125 MG PO TABS: 1.0000 | ORAL_TABLET | Freq: Two times a day (BID) | ORAL | 0 refills | Status: AC

## 2021-04-06 MED ORDER — POTASSIUM CHLORIDE CRYS ER 20 MEQ PO TBCR
40.0000 meq | EXTENDED_RELEASE_TABLET | ORAL | Status: AC
  Administered 2021-04-06 (×2): 40 meq via ORAL
  Filled 2021-04-06 (×2): qty 2

## 2021-04-06 MED ORDER — TRELEGY ELLIPTA 100-62.5-25 MCG/INH IN AEPB: 1.0000 | INHALATION_SPRAY | Freq: Every day | RESPIRATORY_TRACT | Status: DC

## 2021-04-06 MED ORDER — IPRATROPIUM-ALBUTEROL 0.5-2.5 (3) MG/3ML IN SOLN: 3.0000 mL | Freq: Four times a day (QID) | RESPIRATORY_TRACT | Status: AC | PRN

## 2021-04-06 MED ORDER — HYDROXYZINE HCL 25 MG PO TABS
25.0000 mg | ORAL_TABLET | Freq: Once | ORAL | Status: AC
  Administered 2021-04-06: 25 mg via ORAL
  Filled 2021-04-06: qty 1

## 2021-04-06 MED ORDER — MAGNESIUM SULFATE 2 GM/50ML IV SOLN
2.0000 g | Freq: Once | INTRAVENOUS | Status: AC
  Administered 2021-04-06: 2 g via INTRAVENOUS
  Filled 2021-04-06: qty 50

## 2021-04-06 NOTE — Progress Notes (Signed)
Occupational Therapy Evaluation   Per chart review patient came from [facility] for rehab "have been there all summer." Unsure when patient was last home however he states "I don't have anyone that can help me." Patient presents with generalized weakness, decreased activity tolerance and zero standing balance needing max x2 to stand from edge of bed with walker. Attempted pivot transfer to bedside commode however patient fatigues too quickly and has to sit back onto bed. Patient needing max x2 with bed height elevated to stand and utilize sara stedy to transfer to bedside commode. Patient unable to have bowel movement and needing max x2 to stand with stedy and transfer to recliner with hoyer lift pad placed in chair. Recommend return to rehab as patient needing extensive assist with all ADLs and functional transfers, acute OT to follow.    04/06/21 1200  OT Visit Information  Last OT Received On 04/06/21  Assistance Needed +2  PT/OT/SLP Co-Evaluation/Treatment Yes  Reason for Co-Treatment For patient/therapist safety;To address functional/ADL transfers  PT goals addressed during session Mobility/safety with mobility  OT goals addressed during session ADL's and self-care  History of Present Illness 87 year old M with recent hospitalization at [facility] from 7/26-8/10  for severe sepsis due to gangrenous cholecystitis s/p PERC drain placement brought to ED with abdominal pain and drain dislodgment after "he bumped something and fell off his wheelchair".  Denies head injury or loss of consciousness.  CT revealed drain dislodgment with tip and right hepatic lobe.  He underwent successful replacement of biliary drain on 8/31. Dx of sepsis 2* cholecystitis.  with PMH of COPD and chronic hypoxic RF on 2 L at night, recent PE on Eliquis, DM-2 with neuropathy, PVD, left AKA  Precautions  Precautions Fall  Precaution Comments per chart fell out of WC prior to admission, pt didn't recall  this happening; R chole drain  Restrictions  Weight Bearing Restrictions No  Home Living  Family/patient expects to be discharged to: Skilled nursing facility  Prior Function  Level of [place] Needs assistance  Gait / Transfers Assistance Needed assist for [facility] transfers, could self propel WC, hasn't used prosthesis since beginning of summer, from SNF  ADL's / [place] assist needed  Comments patient states he was living at home alone "nobody to help me" however unsure how long it has been since he has been home. came from skilled nursing home facility  Communication  Communication No difficulties  Pain Assessment  Pain Assessment Faces  Faces Pain Scale 2  Pain Location all over  Pain Descriptors / Indicators Aching  Pain Intervention(s) Monitored during session  Cognition  Arousal/Alertness Awake/alert  Behavior During Therapy [facility] for tasks assessed/performed  Overall Cognitive Status No family/caregiver present to determine baseline cognitive functioning  General Comments patient first stating staff at nursing home was assisting him to toilet then next sentence states "I haven't stood all summer"  Upper Extremity Assessment  Upper Extremity Assessment Generalized weakness  Lower Extremity Assessment  Lower Extremity Assessment Defer to PT evaluation  ADL  Overall ADL's  Needs assistance/impaired  Eating/Feeding Independent;Bed level  Grooming Set up;Sitting  Upper Body Bathing Minimal assistance;Sitting  Lower Body Bathing Sitting/lateral leans;Sit to/from stand;Maximal assistance  Upper Body Dressing  Minimal assistance;Sitting  Lower Body Dressing Maximal assistance;Sitting/lateral leans;Sit to/from Personal assistant for physical assistance;+2 for safety/equipment;BSC  Toilet Transfer Details (indicate cue type and reason) max x2 with bed height elevated to stand. total A with use of stedy  to transfer to bedside commode after  attempts to stand pivot with walker  Toileting- Clothing Manipulation and Hygiene Total assistance;+2 for physical assistance;Sit to/from stand  Functional mobility during ADLs Maximal assistance;Total assistance;+2 for physical assistance;+2 for safety/equipment (stedy)  General ADL Comments patient requiring significant assistance for mobility due to global weakness, impaired activity tolerance, balance  Bed Mobility  Overal bed mobility Needs Assistance  Bed Mobility Supine to Sit  Supine to sit Supervision;HOB elevated  General bed mobility comments used rail, increased time but no physical assist  Transfers  Overall transfer level Needs assistance  Equipment used Rolling walker (2 wheeled)  Transfer via Actuary  Transfers Sit to/from Stand  Sit to Stand +2 physical assistance;Max assist;From elevated surface  Stand pivot transfers +2 physical assistance;Total assist;From elevated surface  General transfer comment see toilet transfer in ADL section  Balance  Overall balance assessment Needs assistance  Sitting-balance support Feet supported;No upper extremity supported  Sitting balance-Leahy Scale Fair  Standing balance support Bilateral upper extremity supported  Standing balance-Leahy Scale Zero  Standing balance comment relies on BUE support; can only stand for ~20 seconds with max x2 then fatigues  General Comments  General comments (skin integrity, edema, etc.) HR up to 120-135 with activty  OT - End of Session  Equipment Utilized During Treatment Gait belt;Rolling walker;Other (comment) (stedy)  Activity Tolerance Patient limited by fatigue  Patient left in chair;with call bell/phone within reach;with chair alarm set;Other (comment) (lift pad in chair, informed RN use maxi move)  Nurse Communication Mobility status  OT Assessment  OT Recommendation/Assessment Patient needs continued OT Services  OT Visit Diagnosis Muscle weakness (generalized)  (M62.81);History of falling (Z91.81)  OT Problem List Impaired balance (sitting and/or standing);Decreased activity tolerance;Decreased strength;Decreased safety awareness  OT Plan  OT Frequency (ACUTE ONLY) Min 2X/week  OT Treatment/Interventions (ACUTE ONLY) Self-care/ADL training;Therapeutic exercise;Balance training;Patient/family education;Therapeutic activities  AM-PAC OT "6 Clicks" Daily Activity Outcome Measure (Version 2)  Help from another person eating meals? 4  Help from another person taking care of personal grooming? 3  Help from another person toileting, which includes using toliet, bedpan, or urinal? 1  Help from another person bathing (including washing, rinsing, drying)? 2  Help from another person to put on and taking off regular upper body clothing? 3  Help from another person to put on and taking off regular lower body clothing? 2  6 Click Score 15  Progressive Mobility  What is the highest level of mobility based on the progressive mobility assessment? Level 2 (Chairfast) - Balance while sitting on edge of bed and cannot stand  Mobility Out of bed to chair with meals  OT Recommendation  Follow Up Recommendations SNF  OT Equipment None recommended by OT  Individuals Consulted  Consulted and Agree with Results and Recommendations Patient  Acute Rehab OT Goals  Patient Stated Goal to get strength back  OT Goal Formulation With patient  Time For Goal Achievement 04/20/21  Potential to Achieve Goals Fair  OT Time Calculation  OT Start Time (ACUTE ONLY) 0946  OT Stop Time (ACUTE ONLY) 1019  OT Time Calculation (min) 33 min  OT General Charges  $OT Visit 1 Visit  OT Evaluation  $OT Eval Moderate Complexity 1 Mod  Written Expression  Dominant Hand  (did not specify)   Delbert Phenix OT OT pager: [phone]

## 2021-04-06 NOTE — Discharge Summary (Signed)
Physician Discharge Summary  William Buchanan [id number] DOB: [birth year] DOA: 03/30/2021  PCP: Leota Jacobsen, MD  Admit date: 03/30/2021 Discharge date: 04/06/2021  Admitted From: SNF Disposition: SNF  Recommendations for Outpatient Follow-up:  Follow ups as below. 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  Denies head injury or loss of consciousness.  CT revealed drain dislodgment with tip and right hepatic lobe.  He had leukocytosis with left shift, tachycardia and mild temp on presentation.  He was started on IV Zosyn for possible sepsis due to possible peritonitis/cholecystitis.  He underwent successful replacement of biliary drain on 8/31.  Of note, biliary culture at [facility] with pansensitive E. coli.  Does not seem he was discharged on antibiotics at that time.  Patient remained afebrile and stable.  Leukocytosis improved.  Abdominal pain resolved.  Antibiotics de-escalated to IV Unasyn and he continued to do well.  He is discharged on p.o. Augmentin for 5 more days.  Patient to follow-up with his surgeons and interventional radiology at [facility] as previously planned.  See individual problem list below for more on hospital course.  Discharge Diagnoses:  Sepsis due to possible peritonitis/cholecystitis: POA.  Had leukocytosis, tachycardia and mild temp on presentation.  Patient with history of gangrenous  cholecystitis status post percutaneous drainage at [facility].  Culture at [facility] with pansensitive E. coli.  He is admitted here with abdominal pain and drain dislodgment after accidental fall off his wheelchair. -Drain replaced by IR on 8/31. -Continue biliary drain care -IV Zosyn 8/30-9/5>>> IV Unasyn 90/5-9/6>> p.o. Augmentin for 5 more days -Outpatient follow-up with [facility] medical team on discharge -Check CBC in about 1 week   Abdominal pain: Due to the above.  Resolved.   Essential hypertension: Normotensive off home antihypertensive meds. -Discontinued home lisinopril/HCTZ   Uncontrolled NIDDM-2 with hyperglycemia: On glipizide and Actos at home. Recent Labs  Lab 04/05/21 1736 04/05/21 2210 04/06/21 0223 04/06/21 0523 04/06/21 0747  GLUCAP 204* 78 70 129* 119*  -Discharged on home glipizide and Actos -Continue home statin   Subacute PE-recent diagnosis at [facility] on 7/21 -Continue home Eliquis   Chronic COPD-seems a stable. -Changed Symbicort to Trelegy Ellipta -DuoNeb as needed   Hyperkalemia : Resolved   Hypokalemia: K3.1.  Mg 1.6. -IV magnesium sulfate 2 g x 1 prior to discharge -P.o. KCl 40 mEq x 2 prior to discharge   Left AKA /Ambulatory dysfunction: Uses wheelchair at baseline. -PT/OT eval to assess ability to transfer   BPH without LUTS -Continue home Flomax     Increased nutrition need Body mass index is 23.44 kg/m. Nutrition Problem: Inadequate oral intake Etiology: decreased appetite Signs/Symptoms: per patient/family report Interventions: Boost Breeze, Prostat, MVI      Discharge Exam: Vitals:   04/05/21 2202 04/06/21 0521  BP: 107/62 112/71  Pulse: (!) 110 88  Resp: 18 19  Temp: 97.7 F (36.5 C) 97.7 F (36.5 C)  SpO2: 95% 97%  GENERAL: No apparent distress.  Nontoxic. HEENT: MMM.  Vision and hearing grossly intact.  NECK: Supple.  No apparent JVD.  RESP: 97% on 2 L.  No IWOB.  Fair aeration bilaterally. CVS:  RRR.  Heart sounds normal.  ABD/GI/GU: Bowel sounds present. Soft. Non tender.  PERC drain in place. MSK/EXT: Left AKA.  No edema. SKIN: no apparent skin lesion or wound NEURO: Awake and alert.  Fairly oriented.  No apparent focal neuro deficit. PSYCH: Calm. Normal affect.   Discharge Instructions   Allergies as of 04/06/2021       Reactions   Anesthetics, Amide    Made me crazy and hard to handle.   Doxycycline Shortness Of Breath   Zolpidem    Other reaction(s): Mental Status Changes (intolerance) "combative"   Amoxicillin-pot Clavulanate Rash   Unknown Unknown        Medication List     STOP taking these medications    albuterol 108 (90 Base) MCG/ACT inhaler Commonly known as: VENTOLIN HFA   aspirin EC 81 MG tablet   budesonide-formoterol 160-4.5 MCG/ACT inhaler Commonly known as: SYMBICORT   clopidogrel 75 MG tablet Commonly known as: PLAVIX   diclofenac sodium 1 % Gel Commonly known as: VOLTAREN   lisinopril-hydrochlorothiazide 20-25 MG tablet Commonly known as: ZESTORETIC   polyethylene glycol 17 g packet Commonly known as: MIRALAX / GLYCOLAX       TAKE these medications    acetaminophen 500 MG tablet Commonly known as: TYLENOL Take 500 mg by mouth every 6 (six) hours as needed for moderate pain.   amoxicillin-clavulanate 875-125 MG tablet Commonly known as: AUGMENTIN Take 1 tablet by mouth 2 (two) times daily for 5 days.   apixaban 5 MG Tabs tablet Commonly known as: ELIQUIS Take 1 tablet by mouth in the morning and at bedtime. MD Ordered 1 tablet BID / But according to [facility] , patient seems to only been taking, 1 TABLET DAILY.   cetirizine 10 MG tablet Commonly known as: ZYRTEC Take 1 tablet by mouth daily.   Cholecalciferol 25 MCG (1000 UT) tablet Take 1 tablet by mouth daily.   gabapentin 300 MG capsule Commonly known as: NEURONTIN Take 100 mg by mouth 2 (two) times daily.   glipiZIDE 5 MG tablet Commonly known as: GLUCOTROL Take 5 mg  by mouth 2 (two) times daily.   ipratropium-albuterol 0.5-2.5 (3) MG/3ML Soln Commonly known as: DUONEB Take 3 mLs by nebulization every 6 (six) hours as needed (Shortness of breath, cough and/or wheeze).   memantine 5 MG tablet Commonly known as: NAMENDA Take 5 mg by mouth 2 (two) times daily.   multivitamin with minerals tablet Take 1 tablet by mouth daily.   mupirocin ointment 2 % Commonly known as: BACTROBAN APPLY THIN COAT TWICE DAILY   pantoprazole 40 MG tablet Commonly known as: PROTONIX Take 40 mg by mouth daily.   pioglitazone 30 MG tablet Commonly known as: ACTOS   PRESCRIPTION MEDICATION Take 120 mLs by mouth 2 (two) times daily. MED PASS Supplement, may substitute equal amounts of Health Shake if Med Pass isn't available   simvastatin 20 MG tablet Commonly known as: ZOCOR Take 20 mg by mouth at bedtime.   tamsulosin 0.4 MG Caps capsule Commonly known as: FLOMAX Take 0.4 mg by mouth daily.   Trelegy Ellipta 100-62.5-25 MCG/INH Aepb Generic drug: Fluticasone-Umeclidin-Vilant Inhale 1 puff into the lungs daily.   vitamin B-12 250 MCG tablet Commonly known as: CYANOCOBALAMIN Take 250 mcg by mouth daily.  vitamin C 500 MG tablet Commonly known as: ASCORBIC ACID Take 500 mg by mouth daily.        Consultations: Interventional radiology  Procedures/Studies: Biliary percutaneous drain replacement by interventional radiology on 8/31   CT ABDOMEN PELVIS WO CONTRAST  Result Date: 04/02/2021 CLINICAL DATA:  Abdominal pain acute peritonitis due to dislodged biliary drain. EXAM: CT ABDOMEN AND PELVIS WITHOUT CONTRAST TECHNIQUE: Multidetector CT imaging of the abdomen and pelvis was performed following the standard protocol without IV contrast. COMPARISON:  March 30, 2021 FINDINGS: Lower chest: Right basilar consolidation with small right pleural effusion. Tiny left pleural effusion. Bibasilar fibrotic lung changes. Aortic atherosclerosis. Coronary artery  calcifications. Mitral annulus calcifications. Hepatobiliary: Unremarkable noncontrast appearance of the hepatic parenchyma. Pneumobilia with percutaneous biliary drainage catheter and pigtail coiled in the duodenum. Postsurgical change of cholecystectomy with small volume of fluid in the surgical bed. Central area of high density on image with surrounding soft tissue and mild inflammation measuring 11 mm along the inferior margin of the right lobe of the liver 32/2, slightly more prominent than on prior CT and new from CT February 18, 2021, possibly reflecting inflammation about a dropped gallstone. No walled off fluid collections. Pancreas: Scattered dystrophic pancreatic calcifications sequela of chronic pancreatitis. No pancreatic ductal dilation. Spleen: Within normal limits. Adrenals/Urinary Tract: Bilateral adrenal glands are unremarkable. No hydronephrosis. Calcifications in the bilateral renal hila are favored to be vascular in nature. Urinary bladder is unremarkable for degree of distension. Stomach/Bowel: Stomach is unremarkable for degree of distension. No pathologic dilation of small or large bowel. The appendix and terminal ileum appear normal. Colonic diverticulosis without findings of acute diverticulitis. Vascular/Lymphatic: Severe aortic and branch vessel atherosclerosis without abdominal aortic aneurysm. No pathologically enlarged abdominal or pelvic lymph nodes. Reproductive: Dystrophic prostatic calcifications within an enlarged prostate gland. Other: No significant abdominopelvic ascites or peritoneal thickening. No pneumoperitoneum. Musculoskeletal: Severe multilevel degenerative changes spine. Chronic bilateral L5 pars defects with grade 1 L5 on S1 anterolisthesis. Degenerative changes bilateral hips. Degenerative changes bilateral SI joints. No acute osseous abnormality. IMPRESSION: 1. Postsurgical change of cholecystectomy with similar stranding in the surgical bed. No walled off fluid  collections. 2. Central area of high density with surrounding soft tissue and mild inflammation measuring 11 mm along the inferior margin of the right lobe of the liver, slightly more prominent than on prior CT and new from CT February 18, 2021, possibly reflecting inflammation about a dropped gallstone. 3. No significant abdominopelvic ascites or peritoneal thickening visualized. 4. Similar positioning of the percutaneous biliary drainage catheter with pigtail coiled in the duodenum. 5. Small right greater than left pleural effusions with a right basilar consolidation possibly reflecting infection or atelectasis. 6. Colonic diverticulosis without findings of acute diverticulitis. 7.  Aortic Atherosclerosis (ICD10-I70.0). Electronically Signed   By: Dahlia Bailiff M.D.   On: 04/02/2021 18:00   CT ABDOMEN PELVIS WO CONTRAST  Result Date: 03/30/2021 CLINICAL DATA:  Status post cholecystectomy 3 weeks ago with subsequent removal of biliary tube by the patient which was placed at the time of surgery. EXAM: CT ABDOMEN AND PELVIS WITHOUT CONTRAST TECHNIQUE: Multidetector CT imaging of the abdomen and pelvis was performed following the standard protocol without IV contrast. COMPARISON:  February 22, 2021 FINDINGS: Lower chest: Marked severity chronic interstitial lung disease and peripheral pulmonary fibrosis is noted. There is a small right pleural effusion. Hepatobiliary: No focal liver abnormality is seen. A percutaneous biliary drainage catheter is seen with its distal end noted within the right lobe  of the liver, adjacent to the gallbladder fossa. Status post cholecystectomy. No biliary dilatation. Pancreas: Unremarkable. No pancreatic ductal dilatation or surrounding inflammatory changes. Spleen: Normal in size without focal abnormality. Adrenals/Urinary Tract: Adrenal glands are unremarkable. Kidneys are normal, without obstructing renal calculi, focal lesion, or hydronephrosis. A 4 mm calcification is seen along the  medial aspect of the mid left kidney and may be vascular in origin. Bladder is unremarkable. Stomach/Bowel: Stomach is within normal limits. Appendix appears normal. Stool is seen throughout the colon. No evidence of bowel wall thickening, distention, or inflammatory changes. Noninflamed diverticula are seen throughout the large bowel. Vascular/Lymphatic: Aortic atherosclerosis. No enlarged abdominal or pelvic lymph nodes. Reproductive: There is mild to moderate severity prostate gland enlargement. Other: No abdominal wall hernia or abnormality. No abdominopelvic ascites. Musculoskeletal: There is grade 1 anterolisthesis of the L5 vertebral body on S1 with marked severity multilevel degenerative changes. IMPRESSION: 1. Percutaneous biliary drainage catheter positioning, as described above. 2. Small right pleural effusion. 3. Marked severity chronic interstitial lung disease and peripheral pulmonary fibrosis. 4. Colonic diverticulosis. 5. Enlarged prostate gland. 6. Grade 1 anterolisthesis of the L5 vertebral body on S1 with marked severity multilevel degenerative changes. 7. Aortic atherosclerosis. Aortic Atherosclerosis (ICD10-I70.0). Electronically Signed   By: Virgina Norfolk M.D.   On: 03/30/2021 18:32   IR EXCHANGE BILIARY DRAIN  Result Date: 03/31/2021 INDICATION: 87 year old male with a post cholecystectomy bile leak currently treated with an internal/external biliary drainage catheter. The patient fell and partially dislodged tooth catheter. He presents for catheter rescue and exchange. EXAM: Biliary catheter exchange MEDICATIONS: None. ANESTHESIA/SEDATION: Moderate (conscious) sedation was employed during this procedure. A total of Versed 1 mg and Fentanyl 50 mcg was administered intravenously. Moderate Sedation Time: 30 minutes. The patient's level of consciousness and vital signs were monitored continuously by radiology nursing throughout the procedure under my direct supervision. FLUOROSCOPY TIME:   Fluoroscopy Time: 5 minutes 30 seconds (156 mGy). COMPLICATIONS: None immediate. PROCEDURE: Informed written consent was obtained from the patient after a thorough discussion of the procedural risks, benefits and alternatives. All questions were addressed. Maximal Sterile Barrier Technique was utilized including caps, mask, sterile gowns, sterile gloves, sterile drape, hand hygiene and skin antiseptic. A timeout was performed prior to the initiation of the procedure. Initial fluoroscopic spot imaging demonstrates a biliary drain pulled back into the periphery of the liver. The drainage catheter appears to be kinked on itself. The most proximal sideholes are noted to be external to the body. Contrast injection through the catheter was unsuccessful as the contrast exits the sideholes outside the liver in the peritoneal space. Therefore, the catheter was transected. A wire was passed through the catheter but could not be advanced through the kinked portion. Therefore, an 0 Prolene suture was passed through the external portion of the drainage catheter. A 9 French 35 cm vascular sheath was then modified by removing the hemostatic valve. The sheath was then advanced over the wire and suture combination and over the existing 8.5 [place] biliary drainage catheter. Utilizing the Prolene suture to create tension on the tube, the 9 French sheath was successfully advanced over the tube and into the hepatic parenchyma up to the kinked portion of the drainage catheter. The catheter was then un kinked allowing passage of the wire through the remaining catheter and into what appeared to represent the central biliary tree. The catheter was then further removed off the wire and through the sheath. A 5 French angled catheter was then advanced over  the wire. Contrast injection was performed confirming that the catheter was located in the duodenum. A Bentson wire was then advanced into the duodenum. The catheter and sheath were removed.  A Cook 10.2 [place] biliary drainage catheter was then advanced over the wire and the distal loop formed in the duodenum. A contrast injection was then performed demonstrating patency of the tube which crosses the biliary system and enters the duodenum. The tube was gently flushed and secured to the skin with a combination of 0 Prolene suture and an adhesive fixation device. IMPRESSION: Successful rescue of transhepatic percutaneous biliary access with removal of the partially displaced 8.5 [place] biliary drainage catheter and placement of a new 10.2 French internal/external biliary drain. Electronically Signed   By: Jacqulynn Cadet M.D.   On: 03/31/2021 17:02       The results of significant diagnostics from this hospitalization (including imaging, microbiology, ancillary and laboratory) are listed below for reference.     Microbiology: Recent Results (from the past 240 hour(s))  SARS CORONAVIRUS 2 (TAT 6-24 HRS) Nasopharyngeal Nasopharyngeal Swab     Status: None   Collection Time: 03/30/21  8:55 PM   Specimen: Nasopharyngeal Swab  Result Value Ref Range Status   SARS Coronavirus 2 NEGATIVE NEGATIVE Final    Comment: (NOTE) SARS-CoV-2 target nucleic acids are NOT DETECTED.  The SARS-CoV-2 RNA is generally detectable in upper and lower respiratory specimens during the acute phase of infection. Negative results do not preclude SARS-CoV-2 infection, do not rule out co-infections with other pathogens, and should not be used as the sole basis for treatment or other patient management decisions. Negative results must be combined with clinical observations, patient history, and epidemiological information. The expected result is Negative.  Fact Sheet for Patients: SugarRoll.be  Fact Sheet for Healthcare Providers: https://www.woods-mathews.com/  This test is not yet approved or cleared by the [place] FDA and  has been authorized for  detection and/or diagnosis of SARS-CoV-2 by FDA under an Emergency Use Authorization (EUA). This EUA will remain  in effect (meaning this test can be used) for the duration of the COVID-19 declaration under Se ction 564(b)(1) of the Act, 21 U.S.C. section 360bbb-3(b)(1), unless the authorization is terminated or revoked sooner.  Performed at [facility], [place] [address] [place], [place] [address]   MRSA Next Gen by PCR, Nasal     Status: None   Collection Time: 03/31/21  9:30 PM   Specimen: Nasal Mucosa; Nasal Swab  Result Value Ref Range Status   MRSA by PCR Next Gen NOT DETECTED NOT DETECTED Final    Comment: (NOTE) The GeneXpert MRSA Assay (FDA approved for NASAL specimens only), is one component of a comprehensive MRSA colonization surveillance program. It is not intended to diagnose MRSA infection nor to guide or monitor treatment for MRSA infections. Test performance is not FDA approved in patients less than 85 years old. Performed at [facility], [place] [address] [place], [place] [address]   SARS CORONAVIRUS 2 (TAT 6-24 HRS) Nasopharyngeal Nasopharyngeal Swab     Status: None   Collection Time: 04/05/21  4:24 PM   Specimen: Nasopharyngeal Swab  Result Value Ref Range Status   SARS Coronavirus 2 NEGATIVE NEGATIVE Final    Comment: (NOTE) SARS-CoV-2 target nucleic acids are NOT DETECTED.  The SARS-CoV-2 RNA is generally detectable in upper and lower respiratory specimens during the acute phase of infection. Negative results do not preclude SARS-CoV-2 infection, do not rule out co-infections with  other pathogens, and should not be used as the sole basis for treatment or other patient management decisions. Negative results must be combined with clinical observations, patient history, and epidemiological information. The expected result is Negative.  Fact Sheet for Patients: SugarRoll.be  Fact Sheet for Healthcare  Providers: https://www.woods-mathews.com/  This test is not yet approved or cleared by the [place] FDA and  has been authorized for detection and/or diagnosis of SARS-CoV-2 by FDA under an Emergency Use Authorization (EUA). This EUA will remain  in effect (meaning this test can be used) for the duration of the COVID-19 declaration under Se ction 564(b)(1) of the Act, 21 U.S.C. section 360bbb-3(b)(1), unless the authorization is terminated or revoked sooner.  Performed at [facility], [place] [address] [place], [place] [address]      Labs:  CBC: Recent Labs  Lab 03/30/21 1626 03/31/21 0557 04/02/21 0549 04/03/21 0640 04/04/21 0612 04/04/21 1531 04/05/21 0454 04/06/21 0505  WBC 17.9*   < > 11.7* 12.7* 11.1*  --  11.0* 10.9*  NEUTROABS 15.8*  --   --  8.4*  --   --   --  6.6  HGB 13.5   < > 10.1* 11.5* 9.3* 11.4* 10.4* 10.5*  HCT 43.4   < > 32.1* 35.8* 28.7* 35.5* 31.7* 32.5*  MCV 103.6*   < > 100.0 98.4 98.3  --  96.4 96.4  PLT 279   < > 250 316 317  --  400 495*   < > = values in this interval not displayed.   BMP &GFR Recent Labs  Lab 03/30/21 1740 03/31/21 0557 04/03/21 0640 04/05/21 0454 04/06/21 0505  NA 136 135 134* 136 137  K 5.6* 4.9 4.1 3.2* 3.1*  CL 108 106 103 103 101  CO2 20* '22 25 26 27  '$ GLUCOSE 132* 114* 153* 232* 140*  BUN '13 12 11 9 '$ 7*  CREATININE 0.84 0.75 0.94 0.85 0.71  CALCIUM 9.4 9.5 9.6 9.3 9.1  MG  --   --   --  1.6* 1.6*  PHOS  --   --   --   --  2.3*   Estimated Creatinine Clearance: 68.7 mL/min (by C-G formula based on SCr of 0.71 mg/dL). Liver & Pancreas: Recent Labs  Lab 03/30/21 1740 04/06/21 0505  AST 54*  --   ALT 21  --   ALKPHOS 130*  --   BILITOT 1.5*  --   PROT 6.2*  --   ALBUMIN 2.9* 2.0*   Recent Labs  Lab 03/30/21 1740  LIPASE 39   No results for input(s): AMMONIA in the last 168 hours. Diabetic: No results for input(s): HGBA1C in the last 72 hours. Recent Labs  Lab 04/05/21 1736  04/05/21 2210 04/06/21 0223 04/06/21 0523 04/06/21 0747  GLUCAP 204* 78 70 129* 119*   Cardiac Enzymes: No results for input(s): CKTOTAL, CKMB, CKMBINDEX, TROPONINI in the last 168 hours. No results for input(s): PROBNP in the last 8760 hours. Coagulation Profile: No results for input(s): INR, PROTIME in the last 168 hours. Thyroid Function Tests: No results for input(s): TSH, T4TOTAL, FREET4, T3FREE, THYROIDAB in the last 72 hours. Lipid Profile: No results for input(s): CHOL, HDL, LDLCALC, TRIG, CHOLHDL, LDLDIRECT in the last 72 hours. Anemia Panel: No results for input(s): VITAMINB12, FOLATE, FERRITIN, TIBC, IRON, RETICCTPCT in the last 72 hours. Urine analysis:    Component Value Date/Time   COLORURINE YELLOW (A) 04/03/2021 1545   APPEARANCEUR CLEAR (A) 04/03/2021 1545  LABSPEC 1.025 04/03/2021 1545   PHURINE 6.0 04/03/2021 1545   GLUCOSEU NEGATIVE 04/03/2021 1545   HGBUR NEGATIVE 04/03/2021 1545   BILIRUBINUR NEGATIVE 04/03/2021 1545   KETONESUR 40 (A) 04/03/2021 1545   PROTEINUR TRACE (A) 04/03/2021 1545   NITRITE NEGATIVE 04/03/2021 1545   LEUKOCYTESUR NEGATIVE 04/03/2021 1545   Sepsis Labs: Invalid input(s): PROCALCITONIN, LACTICIDVEN   Time coordinating discharge: 45 minutes  SIGNED:  Mercy Riding, MD  [facility] 04/06/2021, 11:44 AM  If 7PM-7AM, please contact night-coverage [email or web address]

## 2021-04-06 NOTE — NC FL2 (Signed)
[facility] LEVEL OF CARE SCREENING TOOL     IDENTIFICATION  Patient Name: William Buchanan Birthdate: [birth year] Sex: male Admission Date (Current Location): 03/30/2021  [facility] and [place] Number:  Herbalist and Address:  [facility],  [place] [address] [place]      Provider Number: [id number]  Attending Physician Name and Address:  Mercy Riding, MD  Relative Name and Phone Number:       Current Level of Care: Hospital Recommended Level of Care: [place] Prior Approval Number:    Date Approved/Denied:   PASRR Number: [id number]  Discharge Plan: SNF    Current Diagnoses: Patient Active Problem List   Diagnosis Date Noted   Pulmonary embolism ([place]) 04/02/2021   Abdominal pain 03/30/2021   Migration of percutaneous transhepatic biliary drain catheter 03/30/2021   S/P AKA (above knee amputation) unilateral, left ([place]) 03/30/2021   Fall from wheelchair 03/30/2021   Leukocytosis 03/30/2021   COPD (chronic obstructive pulmonary disease) ([place]) 03/30/2021   Hyperkalemia 03/30/2021   Critical lower limb ischemia ([place]) 06/07/2017   Severe protein-calorie malnutrition ([place]) 06/07/2017   Diverticulitis 02/18/2017   Arcus senilis, bilateral 01/10/2017   History of non-ST elevation myocardial infarction (NSTEMI) 12/26/2016   Bilateral carotid artery stenosis 08/25/2016   Benign neoplasm of colon 07/07/2015   Inflammatory polyarthritis ([place]) 07/07/2015   Type 2 diabetes mellitus with diabetic polyneuropathy, without long-term current use of insulin (HCC) 07/07/2015   Carotid atherosclerosis 07/07/2015   Local infection of skin and subcutaneous tissue 09/15/2014   Bunion 09/15/2014   Ulcer of heel and midfoot ([place]) 09/15/2014   Essential hypertension 04/16/2014    Orientation RESPIRATION BLADDER Height & Weight     Self, Place, Situation  Normal Continent Weight: 72 kg Height:  '5\' 9"'$  (175.3 cm)  BEHAVIORAL  SYMPTOMS/MOOD NEUROLOGICAL BOWEL NUTRITION STATUS      Continent Diet  AMBULATORY STATUS COMMUNICATION OF NEEDS Skin   Total Care Verbally Normal                       Personal Care Assistance Level of Assistance  Bathing, Feeding, Dressing, Total care Bathing Assistance: Maximum assistance Feeding assistance: Independent Dressing Assistance: Maximum assistance Total Care Assistance: Maximum assistance   Functional Limitations Info             SPECIAL CARE FACTORS FREQUENCY  PT (By licensed PT), OT (By licensed OT)     PT Frequency: 5x weekly OT Frequency: 5x weekly            Contractures Contractures Info: Not present    Additional Factors Info  Code Status, Allergies Code Status Info: Full Allergies Info: Anesthetics, Amide, Doxycycline, Zolpidem, Amoxicillin-pot Clavulanate           Current Medications (04/06/2021):  This is the current hospital active medication list Current Facility-Administered Medications  Medication Dose Route Frequency Provider Last Rate Last Admin   (feeding supplement) PROSource Plus liquid 30 mL  30 mL Oral BID BM Shawna Clamp, MD   30 mL at 04/06/21 1021   0.9 %  sodium chloride infusion   Intravenous PRN Chotiner, Yevonne Aline, MD   Stopped at 03/31/21 2120   acetaminophen (TYLENOL) tablet 650 mg  650 mg Oral Q6H PRN Chotiner, Yevonne Aline, MD   650 mg at 04/06/21 1022   Or   acetaminophen (TYLENOL) suppository 650 mg  650 mg Rectal Q6H PRN Chotiner, Yevonne Aline, MD  amoxicillin-clavulanate (AUGMENTIN) 875-125 MG per tablet 1 tablet  1 tablet Oral Q12H Wendee Beavers T, MD   1 tablet at 04/06/21 1226   apixaban (ELIQUIS) tablet 5 mg  5 mg Oral BID Arrien, Jimmy Picket, MD   5 mg at 04/06/21 1022   feeding supplement (BOOST / RESOURCE BREEZE) liquid 1 Container  1 Container Oral TID BM Shawna Clamp, MD   1 Container at 04/06/21 1023   fluticasone furoate-vilanterol (BREO ELLIPTA) 100-25 MCG/INH 1 puff  1 puff Inhalation Daily  Gonfa, Bretta Bang T, MD   1 puff at 04/06/21 1023   HYDROmorphone (DILAUDID) injection 0.5 mg  0.5 mg Intravenous Q3H PRN Gonfa, Taye T, MD       insulin aspart (novoLOG) injection 0-15 Units  0-15 Units Subcutaneous TID WC Gonfa, Taye T, MD   3 Units at 04/06/21 1226   insulin aspart (novoLOG) injection 0-5 Units  0-5 Units Subcutaneous QHS Gonfa, Taye T, MD       insulin glargine-yfgn (SEMGLEE) injection 5 Units  5 Units Subcutaneous BID Wendee Beavers T, MD   5 Units at 04/06/21 1022   ipratropium-albuterol (DUONEB) 0.5-2.5 (3) MG/3ML nebulizer solution 3 mL  3 mL Nebulization Q6H PRN Chotiner, Yevonne Aline, MD   3 mL at 04/05/21 1002   melatonin tablet 3 mg  3 mg Oral QHS PRN Lovey Newcomer T, NP   3 mg at 04/05/21 2137   multivitamin with minerals tablet 1 tablet  1 tablet Oral Daily Shawna Clamp, MD   1 tablet at 04/06/21 1023   ondansetron (ZOFRAN) tablet 4 mg  4 mg Oral Q6H PRN Chotiner, Yevonne Aline, MD       Or   ondansetron [facility]) injection 4 mg  4 mg Intravenous Q6H PRN Chotiner, Yevonne Aline, MD   4 mg at 03/30/21 2159   oxyCODONE-acetaminophen (PERCOCET/ROXICET) 5-325 MG per tablet 1 tablet  1 tablet Oral Q8H PRN Mercy Riding, MD   1 tablet at 04/06/21 1226   pantoprazole (PROTONIX) EC tablet 40 mg  40 mg Oral Daily Chotiner, Yevonne Aline, MD   40 mg at 04/06/21 1022   senna-docusate (Senokot-S) tablet 1 tablet  1 tablet Oral QHS PRN Chotiner, Yevonne Aline, MD       tamsulosin (FLOMAX) capsule 0.4 mg  0.4 mg Oral Daily Chotiner, Yevonne Aline, MD   0.4 mg at 04/06/21 1022     Discharge Medications: Please see discharge summary for a list of discharge medications.  Relevant Imaging Results:  Relevant Lab Results:   Additional Information SSN [id number]  Joaquin Courts, RN

## 2021-04-06 NOTE — Evaluation (Signed)
Physical Therapy Evaluation Patient Details Name: William Buchanan MRN: [id number] DOB: 08-29-1933 Today's Date: 04/06/2021   History of Present Illness  87 year old M with recent hospitalization at [facility] from 7/26-8/10  for severe sepsis due to gangrenous cholecystitis s/p PERC drain placement brought to ED with abdominal pain and drain dislodgment after "he bumped something and fell off his wheelchair".  Denies head injury or loss of consciousness.  CT revealed drain dislodgment with tip and right hepatic lobe.  He underwent successful replacement of biliary drain on 8/31. Dx of sepsis 2* cholecystitis.  with PMH of COPD and chronic hypoxic RF on 2 L at night, recent PE on Eliquis, DM-2 with neuropathy, PVD, left AKA  Clinical Impression  Pt admitted with above diagnosis. Pt reports he's been requiring assist for WC transfers at SNF, prior to this admission. He was able to self propel the WC there. +2 max assist for sit to stand x 6 trials (2 with RW, 4 with Stedy lift equipment). Pt able to stand for ~20 seconds, limited by fatigue, HR 135 max with activity. Wound noted R upper/inner buttock, RN notified and applied dressing during PT session. SNF recommended.  Pt currently with functional limitations due to the deficits listed below (see PT Problem List). Pt will benefit from skilled PT to increase their independence and safety with mobility to allow discharge to the venue listed below.       Follow Up Recommendations SNF;Supervision/Assistance - 24 hour;Supervision for mobility/OOB    Equipment Recommendations  None recommended by PT    Recommendations for Other Services       Precautions / Restrictions Precautions Precautions: Fall Precaution Comments: per chart fell out of WC prior to admission, pt didn't recall this happening; R chole drain Restrictions Weight Bearing Restrictions: No      Mobility  Bed Mobility Overal bed mobility: Needs Assistance Bed Mobility: Supine to Sit      Supine to sit: Supervision;HOB elevated     General bed mobility comments: used rail, increased time but no physical assist    Transfers Overall transfer level: Needs assistance Equipment used: Rolling walker (2 wheeled) Transfers: Sit to/from [facility] Sit to Stand: +2 physical assistance;Max assist;From elevated surface Stand pivot transfers: +2 physical assistance;Total assist;From elevated surface       General transfer comment: sit to stand x 2 with RW, unable to come fully upright, then sit to stand x 4 with stedy; transferred to [facility] then to recliner. Pt only able to tolerate ~20 seconds of standing, limited by fatigue. HR 135 max with activity.  Noted pressure sore lower sacral area, RN notified and applied dressing during PT session.  Ambulation/Gait             General Gait Details: unable, WC bound at baseline  Stairs            Wheelchair Mobility    Modified Rankin (Stroke Patients Only)       Balance Overall balance assessment: Needs assistance Sitting-balance support: Feet supported;No upper extremity supported Sitting balance-Leahy Scale: Fair     Standing balance support: Bilateral upper extremity supported Standing balance-Leahy Scale: Zero Standing balance comment: relies on BUE support; can only stand for ~20 seconds then fatigues                             Pertinent Vitals/Pain Pain Assessment: Faces Faces Pain Scale: Hurts a little bit Pain Location: all over  Pain Descriptors / Indicators: Aching Pain Intervention(s): Limited activity within patient's tolerance;Monitored during session;Repositioned    Home Living Family/patient expects to be discharged to:: Skilled nursing facility                      Prior Function Level of Independence: Needs assistance   Gait / Transfers Assistance Needed: assist for WC transfers, could self propel WC, hasn't used prosthesis since beginning of summer,  from SNF  ADL's / Homemaking Assistance Needed: assist needed        Hand Dominance        Extremity/Trunk Assessment   Upper Extremity Assessment Upper Extremity Assessment: Defer to OT evaluation    Lower Extremity Assessment Lower Extremity Assessment: RLE deficits/detail RLE Deficits / Details: knee ext 4/5, sensation intact to light touch R foot but reports it does get tingly at times RLE Sensation: WNL RLE Coordination: WNL    Cervical / Trunk Assessment Cervical / Trunk Assessment: Normal  Communication   Communication: No difficulties  Cognition Arousal/Alertness: Awake/alert Behavior During Therapy: WFL for tasks assessed/performed Overall Cognitive Status: Within Functional Limits for tasks assessed                                        General Comments      Exercises     Assessment/Plan    PT Assessment Patient needs continued PT services  PT Problem List Decreased strength;Decreased activity tolerance;Decreased balance;Decreased mobility;Pain;Decreased knowledge of use of DME       PT Treatment Interventions Functional mobility training;Therapeutic activities;Balance training;Patient/family education    PT Goals (Current goals can be found in the Care Plan section)  Acute Rehab PT Goals Patient Stated Goal: to get strength back PT Goal Formulation: With patient Time For Goal Achievement: 04/20/21 Potential to Achieve Goals: Fair    Frequency Min 2X/week   Barriers to discharge        Co-evaluation PT/OT/SLP Co-Evaluation/Treatment: Yes Reason for Co-Treatment: Complexity of the patient's impairments (multi-system involvement);For patient/therapist safety;To address functional/ADL transfers PT goals addressed during session: Mobility/safety with mobility;Proper use of DME         AM-PAC PT "6 Clicks" Mobility  Outcome Measure Help needed turning from your back to your side while in a flat bed without using bedrails?:  A Little Help needed moving from lying on your back to sitting on the side of a flat bed without using bedrails?: A Little Help needed moving to and from a bed to a chair (including a wheelchair)?: Total Help needed standing up from a chair using your arms (e.g., wheelchair or bedside chair)?: Total Help needed to walk in hospital room?: Total Help needed climbing 3-5 steps with a railing? : Total 6 Click Score: 10    End of Session Equipment Utilized During Treatment: Gait belt Activity Tolerance: Patient limited by fatigue Patient left: in chair;with call bell/phone within reach;with chair alarm set Nurse Communication: Mobility status;Need for lift equipment PT Visit Diagnosis: Other abnormalities of gait and mobility (R26.89);Pain;Muscle weakness (generalized) (M62.81)    Time: [id number] PT Time Calculation (min) (ACUTE ONLY): 39 min   Charges:   PT Evaluation $PT Eval Moderate Complexity: 1 Mod PT Treatments $Therapeutic Activity: 8-22 mins       Blondell Reveal Kistler PT 04/06/2021  Acute Rehabilitation Services Pager [phone] Office [phone]

## 2021-04-07 LAB — GLUCOSE, CAPILLARY
Glucose-Capillary: 73 mg/dL (ref 70–99)
Glucose-Capillary: 135 mg/dL — ABNORMAL HIGH (ref 70–99)
Glucose-Capillary: 184 mg/dL — ABNORMAL HIGH (ref 70–99)
Glucose-Capillary: 91 mg/dL (ref 70–99)

## 2021-04-07 LAB — SARS CORONAVIRUS 2 (TAT 6-24 HRS): SARS Coronavirus 2: NEGATIVE

## 2021-04-07 NOTE — TOC Progression Note (Addendum)
Transition of Care [facility]) - Progression Note    Patient Details  Name: William Buchanan MRN: [id number] Date of Birth: 06-Jul-1934  Transition of Care [facility]) CM/SW Contact  Natasha Bence, LCSW Phone Number: 04/07/2021, 1:26 PM  Clinical Narrative:    CSW contacted [facility] to inquire about auth. [facility] reported that they are still waiting on authorization. TOC to follow.  Addendum Janie with [employer] reported auth received for discharge tomorrow on 09/08. CSW requested Covid test. TOC to follow.       Expected Discharge Plan and Services           Expected Discharge Date: 04/06/21                                     Social Determinants of Health (SDOH) Interventions    Readmission Risk Interventions No flowsheet data found.

## 2021-04-07 NOTE — Discharge Summary (Signed)
Physician Discharge Summary  William Buchanan [id number] DOB: 1933/09/04 DOA: 03/30/2021  PCP: Leota Jacobsen, MD  Admit date: 03/30/2021 Discharge date: 04/07/2021  Admitted From: SNF Disposition: SNF  Recommendations for Outpatient Follow-up:  Follow ups as below. 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  Denies head injury or loss of consciousness.  CT revealed drain dislodgment with tip and right hepatic lobe.  He had leukocytosis with left shift, tachycardia and mild temp on presentation.  He was started on IV Zosyn for possible sepsis due to possible peritonitis/cholecystitis.  He underwent successful replacement of biliary drain on 8/31.  Of note, biliary culture at [facility] with pansensitive E. coli.  Does not seem he was discharged on antibiotics at that time.  Patient remained afebrile and stable.  Leukocytosis improved.  Abdominal pain resolved.  Antibiotics de-escalated to IV Unasyn and he continued to do well.  He is discharged on p.o. Augmentin for 5 more days.  Patient to follow-up with his surgeons and interventional radiology at [facility] as previously planned.  See individual problem list below for more on hospital course.  Discharge Diagnoses:  Sepsis due to possible peritonitis/cholecystitis: POA.  Had leukocytosis, tachycardia and mild temp on presentation.  Patient with history of gangrenous  cholecystitis status post percutaneous drainage at [facility].  Culture at [facility] with pansensitive E. coli.  He is admitted here with abdominal pain and drain dislodgment after accidental fall off his wheelchair. -Drain replaced by IR on 8/31. -Continue biliary drain care -IV Zosyn 8/30-9/5>>> IV Unasyn 90/5-9/6>> p.o. Augmentin for 5 more days -Outpatient follow-up with [facility] medical team on discharge -Check CBC in about 1 week   Abdominal pain: Due to the above.  Resolved.   Essential hypertension: Normotensive off home antihypertensive meds. -Discontinued home lisinopril/HCTZ   Uncontrolled NIDDM-2 with hyperglycemia: On glipizide and Actos at home. Recent Labs  Lab 04/06/21 1753 04/06/21 1808 04/06/21 2214 04/07/21 0740 04/07/21 1202  GLUCAP 57* 139* 154* 73 135*   -Discharged on home glipizide and Actos -Continue home statin   Subacute PE-recent diagnosis at [facility] on 7/21 -Continue home Eliquis   Chronic COPD-seems a stable. -Changed Symbicort to Trelegy Ellipta -DuoNeb as needed   Hyperkalemia : Resolved   Hypokalemia: K3.1.  Mg 1.6. -IV magnesium sulfate 2 g x 1 prior to discharge -P.o. KCl 40 mEq x 2 prior to discharge   Left AKA /Ambulatory dysfunction: Uses wheelchair at baseline. -PT/OT eval to assess ability to transfer   BPH without LUTS -Continue home Flomax     Increased nutrition need Body mass index is 23.44 kg/m. Nutrition Problem: Inadequate oral intake Etiology: decreased appetite Signs/Symptoms: per patient/family report Interventions: Boost Breeze, Prostat, MVI      Discharge Exam: Vitals:   04/07/21 0420 04/07/21 0727  BP: 110/85   Pulse: 71   Resp: 18   Temp: 98.4 F (36.9 C)   SpO2: 95% 95%  GENERAL: No apparent distress.  Nontoxic. HEENT: MMM.  Vision and hearing grossly intact.  NECK: Supple.  No apparent JVD.  RESP: 97% on 2 L.  No IWOB.  Fair aeration bilaterally. CVS:  RRR. Heart sounds normal.   ABD/GI/GU: Bowel sounds present. Soft. Non tender.  PERC drain in place. MSK/EXT: Left AKA.  No edema. SKIN: no apparent skin lesion or wound NEURO: Awake and alert.  Fairly oriented.  No apparent focal neuro deficit. PSYCH: Calm. Normal affect.   Discharge Instructions   Allergies as of 04/07/2021       Reactions   Anesthetics, Amide    Made me crazy and hard to handle.   Doxycycline Shortness Of Breath   Zolpidem    Other reaction(s): Mental Status Changes (intolerance) "combative"   Amoxicillin-pot Clavulanate Rash   Unknown Unknown        Medication List     STOP taking these medications    albuterol 108 (90 Base) MCG/ACT inhaler Commonly known as: VENTOLIN HFA   aspirin EC 81 MG tablet   budesonide-formoterol 160-4.5 MCG/ACT inhaler Commonly known as: SYMBICORT   clopidogrel 75 MG tablet Commonly known as: PLAVIX   diclofenac sodium 1 % Gel Commonly known as: VOLTAREN   lisinopril-hydrochlorothiazide 20-25 MG tablet Commonly known as: ZESTORETIC   polyethylene glycol 17 g packet Commonly known as: MIRALAX / GLYCOLAX       TAKE these medications    acetaminophen 500 MG tablet Commonly known as: TYLENOL Take 500 mg by mouth every 6 (six) hours as needed for moderate pain.   amoxicillin-clavulanate 875-125 MG tablet Commonly known as: AUGMENTIN Take 1 tablet by mouth 2 (two) times daily for 5 days.   apixaban 5 MG Tabs tablet Commonly known as: ELIQUIS Take 1 tablet by mouth in the morning and at bedtime. MD Ordered 1 tablet BID / But according to [facility] , patient seems to only been taking, 1 TABLET DAILY.   cetirizine 10 MG tablet Commonly known as: ZYRTEC Take 1 tablet by mouth daily.   Cholecalciferol 25 MCG (1000 UT) tablet Take 1 tablet by mouth daily.   gabapentin 300 MG capsule Commonly known as: NEURONTIN Take 100 mg by mouth 2 (two) times daily.   glipiZIDE 5 MG tablet Commonly known as: GLUCOTROL Take 5 mg by mouth 2 (two) times  daily.   ipratropium-albuterol 0.5-2.5 (3) MG/3ML Soln Commonly known as: DUONEB Take 3 mLs by nebulization every 6 (six) hours as needed (Shortness of breath, cough and/or wheeze).   memantine 5 MG tablet Commonly known as: NAMENDA Take 5 mg by mouth 2 (two) times daily.   multivitamin with minerals tablet Take 1 tablet by mouth daily.   mupirocin ointment 2 % Commonly known as: BACTROBAN APPLY THIN COAT TWICE DAILY   pantoprazole 40 MG tablet Commonly known as: PROTONIX Take 40 mg by mouth daily.   pioglitazone 30 MG tablet Commonly known as: ACTOS   PRESCRIPTION MEDICATION Take 120 mLs by mouth 2 (two) times daily. MED PASS Supplement, may substitute equal amounts of Health Shake if Med Pass isn't available   simvastatin 20 MG tablet Commonly known as: ZOCOR Take 20 mg by mouth at bedtime.   tamsulosin 0.4 MG Caps capsule Commonly known as: FLOMAX Take 0.4 mg by mouth daily.   Trelegy Ellipta 100-62.5-25 MCG/INH Aepb Generic drug: Fluticasone-Umeclidin-Vilant Inhale 1 puff into the lungs daily.   vitamin B-12 250 MCG tablet Commonly known as: CYANOCOBALAMIN Take 250 mcg by mouth daily.  vitamin C 500 MG tablet Commonly known as: ASCORBIC ACID Take 500 mg by mouth daily.        Consultations: Interventional radiology  Procedures/Studies: Biliary percutaneous drain replacement by interventional radiology on 8/31   CT ABDOMEN PELVIS WO CONTRAST  Result Date: 04/02/2021 CLINICAL DATA:  Abdominal pain acute peritonitis due to dislodged biliary drain. EXAM: CT ABDOMEN AND PELVIS WITHOUT CONTRAST TECHNIQUE: Multidetector CT imaging of the abdomen and pelvis was performed following the standard protocol without IV contrast. COMPARISON:  March 30, 2021 FINDINGS: Lower chest: Right basilar consolidation with small right pleural effusion. Tiny left pleural effusion. Bibasilar fibrotic lung changes. Aortic atherosclerosis. Coronary artery calcifications. Mitral  annulus calcifications. Hepatobiliary: Unremarkable noncontrast appearance of the hepatic parenchyma. Pneumobilia with percutaneous biliary drainage catheter and pigtail coiled in the duodenum. Postsurgical change of cholecystectomy with small volume of fluid in the surgical bed. Central area of high density on image with surrounding soft tissue and mild inflammation measuring 11 mm along the inferior margin of the right lobe of the liver 32/2, slightly more prominent than on prior CT and new from CT February 18, 2021, possibly reflecting inflammation about a dropped gallstone. No walled off fluid collections. Pancreas: Scattered dystrophic pancreatic calcifications sequela of chronic pancreatitis. No pancreatic ductal dilation. Spleen: Within normal limits. Adrenals/Urinary Tract: Bilateral adrenal glands are unremarkable. No hydronephrosis. Calcifications in the bilateral renal hila are favored to be vascular in nature. Urinary bladder is unremarkable for degree of distension. Stomach/Bowel: Stomach is unremarkable for degree of distension. No pathologic dilation of small or large bowel. The appendix and terminal ileum appear normal. Colonic diverticulosis without findings of acute diverticulitis. Vascular/Lymphatic: Severe aortic and branch vessel atherosclerosis without abdominal aortic aneurysm. No pathologically enlarged abdominal or pelvic lymph nodes. Reproductive: Dystrophic prostatic calcifications within an enlarged prostate gland. Other: No significant abdominopelvic ascites or peritoneal thickening. No pneumoperitoneum. Musculoskeletal: Severe multilevel degenerative changes spine. Chronic bilateral L5 pars defects with grade 1 L5 on S1 anterolisthesis. Degenerative changes bilateral hips. Degenerative changes bilateral SI joints. No acute osseous abnormality. IMPRESSION: 1. Postsurgical change of cholecystectomy with similar stranding in the surgical bed. No walled off fluid collections. 2. Central area of  high density with surrounding soft tissue and mild inflammation measuring 11 mm along the inferior margin of the right lobe of the liver, slightly more prominent than on prior CT and new from CT February 18, 2021, possibly reflecting inflammation about a dropped gallstone. 3. No significant abdominopelvic ascites or peritoneal thickening visualized. 4. Similar positioning of the percutaneous biliary drainage catheter with pigtail coiled in the duodenum. 5. Small right greater than left pleural effusions with a right basilar consolidation possibly reflecting infection or atelectasis. 6. Colonic diverticulosis without findings of acute diverticulitis. 7.  Aortic Atherosclerosis (ICD10-I70.0). Electronically Signed   By: Dahlia Bailiff M.D.   On: 04/02/2021 18:00   CT ABDOMEN PELVIS WO CONTRAST  Result Date: 03/30/2021 CLINICAL DATA:  Status post cholecystectomy 3 weeks ago with subsequent removal of biliary tube by the patient which was placed at the time of surgery. EXAM: CT ABDOMEN AND PELVIS WITHOUT CONTRAST TECHNIQUE: Multidetector CT imaging of the abdomen and pelvis was performed following the standard protocol without IV contrast. COMPARISON:  February 22, 2021 FINDINGS: Lower chest: Marked severity chronic interstitial lung disease and peripheral pulmonary fibrosis is noted. There is a small right pleural effusion. Hepatobiliary: No focal liver abnormality is seen. A percutaneous biliary drainage catheter is seen with its distal end noted within the right lobe  of the liver, adjacent to the gallbladder fossa. Status post cholecystectomy. No biliary dilatation. Pancreas: Unremarkable. No pancreatic ductal dilatation or surrounding inflammatory changes. Spleen: Normal in size without focal abnormality. Adrenals/Urinary Tract: Adrenal glands are unremarkable. Kidneys are normal, without obstructing renal calculi, focal lesion, or hydronephrosis. A 4 mm calcification is seen along the medial aspect of the mid left  kidney and may be vascular in origin. Bladder is unremarkable. Stomach/Bowel: Stomach is within normal limits. Appendix appears normal. Stool is seen throughout the colon. No evidence of bowel wall thickening, distention, or inflammatory changes. Noninflamed diverticula are seen throughout the large bowel. Vascular/Lymphatic: Aortic atherosclerosis. No enlarged abdominal or pelvic lymph nodes. Reproductive: There is mild to moderate severity prostate gland enlargement. Other: No abdominal wall hernia or abnormality. No abdominopelvic ascites. Musculoskeletal: There is grade 1 anterolisthesis of the L5 vertebral body on S1 with marked severity multilevel degenerative changes. IMPRESSION: 1. Percutaneous biliary drainage catheter positioning, as described above. 2. Small right pleural effusion. 3. Marked severity chronic interstitial lung disease and peripheral pulmonary fibrosis. 4. Colonic diverticulosis. 5. Enlarged prostate gland. 6. Grade 1 anterolisthesis of the L5 vertebral body on S1 with marked severity multilevel degenerative changes. 7. Aortic atherosclerosis. Aortic Atherosclerosis (ICD10-I70.0). Electronically Signed   By: Virgina Norfolk M.D.   On: 03/30/2021 18:32   IR EXCHANGE BILIARY DRAIN  Result Date: 03/31/2021 INDICATION: 87 year old male with a post cholecystectomy bile leak currently treated with an internal/external biliary drainage catheter. The patient fell and partially dislodged tooth catheter. He presents for catheter rescue and exchange. EXAM: Biliary catheter exchange MEDICATIONS: None. ANESTHESIA/SEDATION: Moderate (conscious) sedation was employed during this procedure. A total of Versed 1 mg and Fentanyl 50 mcg was administered intravenously. Moderate Sedation Time: 30 minutes. The patient's level of consciousness and vital signs were monitored continuously by radiology nursing throughout the procedure under my direct supervision. FLUOROSCOPY TIME:  Fluoroscopy Time: 5 minutes  30 seconds (156 mGy). COMPLICATIONS: None immediate. PROCEDURE: Informed written consent was obtained from the patient after a thorough discussion of the procedural risks, benefits and alternatives. All questions were addressed. Maximal Sterile Barrier Technique was utilized including caps, mask, sterile gowns, sterile gloves, sterile drape, hand hygiene and skin antiseptic. A timeout was performed prior to the initiation of the procedure. Initial fluoroscopic spot imaging demonstrates a biliary drain pulled back into the periphery of the liver. The drainage catheter appears to be kinked on itself. The most proximal sideholes are noted to be external to the body. Contrast injection through the catheter was unsuccessful as the contrast exits the sideholes outside the liver in the peritoneal space. Therefore, the catheter was transected. A wire was passed through the catheter but could not be advanced through the kinked portion. Therefore, an 0 Prolene suture was passed through the external portion of the drainage catheter. A 9 French 35 cm vascular sheath was then modified by removing the hemostatic valve. The sheath was then advanced over the wire and suture combination and over the existing 8.5 [place] biliary drainage catheter. Utilizing the Prolene suture to create tension on the tube, the 9 French sheath was successfully advanced over the tube and into the hepatic parenchyma up to the kinked portion of the drainage catheter. The catheter was then un kinked allowing passage of the wire through the remaining catheter and into what appeared to represent the central biliary tree. The catheter was then further removed off the wire and through the sheath. A 5 French angled catheter was then advanced over  the wire. Contrast injection was performed confirming that the catheter was located in the duodenum. A Bentson wire was then advanced into the duodenum. The catheter and sheath were removed. A Cook 10.2 [place] biliary  drainage catheter was then advanced over the wire and the distal loop formed in the duodenum. A contrast injection was then performed demonstrating patency of the tube which crosses the biliary system and enters the duodenum. The tube was gently flushed and secured to the skin with a combination of 0 Prolene suture and an adhesive fixation device. IMPRESSION: Successful rescue of transhepatic percutaneous biliary access with removal of the partially displaced 8.5 [place] biliary drainage catheter and placement of a new 10.2 French internal/external biliary drain. Electronically Signed   By: Jacqulynn Cadet M.D.   On: 03/31/2021 17:02       The results of significant diagnostics from this hospitalization (including imaging, microbiology, ancillary and laboratory) are listed below for reference.     Microbiology: Recent Results (from the past 240 hour(s))  SARS CORONAVIRUS 2 (TAT 6-24 HRS) Nasopharyngeal Nasopharyngeal Swab     Status: None   Collection Time: 03/30/21  8:55 PM   Specimen: Nasopharyngeal Swab  Result Value Ref Range Status   SARS Coronavirus 2 NEGATIVE NEGATIVE Final    Comment: (NOTE) SARS-CoV-2 target nucleic acids are NOT DETECTED.  The SARS-CoV-2 RNA is generally detectable in upper and lower respiratory specimens during the acute phase of infection. Negative results do not preclude SARS-CoV-2 infection, do not rule out co-infections with other pathogens, and should not be used as the sole basis for treatment or other patient management decisions. Negative results must be combined with clinical observations, patient history, and epidemiological information. The expected result is Negative.  Fact Sheet for Patients: SugarRoll.be  Fact Sheet for Healthcare Providers: https://www.woods-mathews.com/  This test is not yet approved or cleared by the [place] FDA and  has been authorized for detection and/or diagnosis of  SARS-CoV-2 by FDA under an Emergency Use Authorization (EUA). This EUA will remain  in effect (meaning this test can be used) for the duration of the COVID-19 declaration under Se ction 564(b)(1) of the Act, 21 U.S.C. section 360bbb-3(b)(1), unless the authorization is terminated or revoked sooner.  Performed at [facility], [place] [address] [place], [place] [address]   MRSA Next Gen by PCR, Nasal     Status: None   Collection Time: 03/31/21  9:30 PM   Specimen: Nasal Mucosa; Nasal Swab  Result Value Ref Range Status   MRSA by PCR Next Gen NOT DETECTED NOT DETECTED Final    Comment: (NOTE) The GeneXpert MRSA Assay (FDA approved for NASAL specimens only), is one component of a comprehensive MRSA colonization surveillance program. It is not intended to diagnose MRSA infection nor to guide or monitor treatment for MRSA infections. Test performance is not FDA approved in patients less than 97 years old. Performed at [facility], [place] [address], [place], [place] [address]   SARS CORONAVIRUS 2 (TAT 6-24 HRS) Nasopharyngeal Nasopharyngeal Swab     Status: None   Collection Time: 04/05/21  4:24 PM   Specimen: Nasopharyngeal Swab  Result Value Ref Range Status   SARS Coronavirus 2 NEGATIVE NEGATIVE Final    Comment: (NOTE) SARS-CoV-2 target nucleic acids are NOT DETECTED.  The SARS-CoV-2 RNA is generally detectable in upper and lower respiratory specimens during the acute phase of infection. Negative results do not preclude SARS-CoV-2 infection, do not rule out co-infections with  other pathogens, and should not be used as the sole basis for treatment or other patient management decisions. Negative results must be combined with clinical observations, patient history, and epidemiological information. The expected result is Negative.  Fact Sheet for Patients: SugarRoll.be  Fact Sheet for Healthcare  Providers: https://www.woods-mathews.com/  This test is not yet approved or cleared by the [place] FDA and  has been authorized for detection and/or diagnosis of SARS-CoV-2 by FDA under an Emergency Use Authorization (EUA). This EUA will remain  in effect (meaning this test can be used) for the duration of the COVID-19 declaration under Se ction 564(b)(1) of the Act, 21 U.S.C. section 360bbb-3(b)(1), unless the authorization is terminated or revoked sooner.  Performed at [facility], [place] [address] [place], [place] [address]      Labs:  CBC: Recent Labs  Lab 04/02/21 [phone] 04/03/21 0640 04/04/21 0612 04/04/21 1531 04/05/21 0454 04/06/21 0505  WBC 11.7* 12.7* 11.1*  --  11.0* 10.9*  NEUTROABS  --  8.4*  --   --   --  6.6  HGB 10.1* 11.5* 9.3* 11.4* 10.4* 10.5*  HCT 32.1* 35.8* 28.7* 35.5* 31.7* 32.5*  MCV 100.0 98.4 98.3  --  96.4 96.4  PLT 250 316 317  --  400 495*    BMP &GFR Recent Labs  Lab 04/03/21 0640 04/05/21 0454 04/06/21 0505  NA 134* 136 137  K 4.1 3.2* 3.1*  CL 103 103 101  CO2 '25 26 27  '$ GLUCOSE 153* 232* 140*  BUN 11 9 7*  CREATININE 0.94 0.85 0.71  CALCIUM 9.6 9.3 9.1  MG  --  1.6* 1.6*  PHOS  --   --  2.3*    Estimated Creatinine Clearance: 68.7 mL/min (by C-G formula based on SCr of 0.71 mg/dL). Liver & Pancreas: Recent Labs  Lab 04/06/21 0505  ALBUMIN 2.0*    No results for input(s): LIPASE, AMYLASE in the last 168 hours.  No results for input(s): AMMONIA in the last 168 hours. Diabetic: No results for input(s): HGBA1C in the last 72 hours. Recent Labs  Lab 04/06/21 1753 04/06/21 1808 04/06/21 2214 04/07/21 0740 04/07/21 1202  GLUCAP 57* 139* 154* 73 135*    Cardiac Enzymes: No results for input(s): CKTOTAL, CKMB, CKMBINDEX, TROPONINI in the last 168 hours. No results for input(s): PROBNP in the last 8760 hours. Coagulation Profile: No results for input(s): INR, PROTIME in the last 168  hours. Thyroid Function Tests: No results for input(s): TSH, T4TOTAL, FREET4, T3FREE, THYROIDAB in the last 72 hours. Lipid Profile: No results for input(s): CHOL, HDL, LDLCALC, TRIG, CHOLHDL, LDLDIRECT in the last 72 hours. Anemia Panel: No results for input(s): VITAMINB12, FOLATE, FERRITIN, TIBC, IRON, RETICCTPCT in the last 72 hours. Urine analysis:    Component Value Date/Time   COLORURINE YELLOW (A) 04/03/2021 1545   APPEARANCEUR CLEAR (A) 04/03/2021 1545   LABSPEC 1.025 04/03/2021 1545   PHURINE 6.0 04/03/2021 1545   GLUCOSEU NEGATIVE 04/03/2021 1545   HGBUR NEGATIVE 04/03/2021 1545   BILIRUBINUR NEGATIVE 04/03/2021 1545   KETONESUR 40 (A) 04/03/2021 1545   PROTEINUR TRACE (A) 04/03/2021 1545   NITRITE NEGATIVE 04/03/2021 1545   LEUKOCYTESUR NEGATIVE 04/03/2021 1545   Sepsis Labs: Invalid input(s): PROCALCITONIN, LACTICIDVEN   Time coordinating discharge: 45 minutes  SIGNED:  Hosie Poisson, MD  [facility] 04/07/2021, 1:53 PM  If 7PM-7AM, please contact night-coverage [email or web address]   Pt seen and examined at bedside.  No new changes, currently waiting for bed  at the facility.   Hosie Poisson, MD

## 2021-04-08 LAB — GLUCOSE, CAPILLARY
Glucose-Capillary: 48 mg/dL — ABNORMAL LOW (ref 70–99)
Glucose-Capillary: 55 mg/dL — ABNORMAL LOW (ref 70–99)
Glucose-Capillary: 61 mg/dL — ABNORMAL LOW (ref 70–99)
Glucose-Capillary: 192 mg/dL — ABNORMAL HIGH (ref 70–99)
Glucose-Capillary: 140 mg/dL — ABNORMAL HIGH (ref 70–99)

## 2021-04-08 NOTE — Care Management Important Message (Signed)
Important Message  Patient Details IM Letter placed in Patient's room for Daughter [name]. Name: William Buchanan MRN: [id number] Date of Birth: [birth year]   Medicare Important Message Given:  Yes     Kerin Salen 04/08/2021, 10:04 AM

## 2021-04-08 NOTE — Discharge Summary (Signed)
Physician Discharge Summary  William Buchanan [id number] DOB: 01/02/1934 DOA: 03/30/2021  PCP: Leota Jacobsen, MD  Admit date: 03/30/2021 Discharge date: 04/08/2021  Admitted From: SNF Disposition: SNF  Recommendations for Outpatient Follow-up:  Follow ups as below. 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  Denies head injury or loss of consciousness.  CT revealed drain dislodgment with tip and right hepatic lobe.  He had leukocytosis with left shift, tachycardia and mild temp on presentation.  He was started on IV Zosyn for possible sepsis due to possible peritonitis/cholecystitis.  He underwent successful replacement of biliary drain on 8/31.  Of note, biliary culture at [facility] with pansensitive E. coli.  Does not seem he was discharged on antibiotics at that time.  Patient remained afebrile and stable.  Leukocytosis improved.  Abdominal pain resolved.  Antibiotics de-escalated to IV Unasyn and he continued to do well.  He is discharged on p.o. Augmentin for 5 more days.  Patient to follow-up with his surgeons and interventional radiology at [facility] as previously planned.  See individual problem list below for more on hospital course.  Discharge Diagnoses:  Sepsis due to possible peritonitis/cholecystitis: POA.  Had leukocytosis, tachycardia and mild temp on presentation.  Patient with history of gangrenous  cholecystitis status post percutaneous drainage at [facility].  Culture at [facility] with pansensitive E. coli.  He is admitted here with abdominal pain and drain dislodgment after accidental fall off his wheelchair. -Drain replaced by IR on 8/31. -Continue biliary drain care -IV Zosyn 8/30-9/5>>> IV Unasyn 90/5-9/6>> p.o. Augmentin for 5 more days -Outpatient follow-up with [facility] medical team on discharge -Check CBC in about 1 week   Abdominal pain: Due to the above.  Resolved.   Essential hypertension: Normotensive off home antihypertensive meds. -Discontinued home lisinopril/HCTZ   Uncontrolled NIDDM-2 with hyperglycemia: On glipizide and Actos at home. Recent Labs  Lab 04/07/21 2107 04/08/21 0745 04/08/21 0748 04/08/21 0825 04/08/21 0952  GLUCAP 91 48* 55* 61* 192*   -Discharged on home glipizide and Actos -Continue home statin   Subacute PE-recent diagnosis at [facility] on 7/21 -Continue home Eliquis   Chronic COPD-seems a stable. -Changed Symbicort to Trelegy Ellipta -DuoNeb as needed   Hyperkalemia : Resolved   Hypokalemia: K3.1.  Mg 1.6. -IV magnesium sulfate 2 g x 1 prior to discharge -P.o. KCl 40 mEq x 2 prior to discharge   Left AKA /Ambulatory dysfunction: Uses wheelchair at baseline. -PT/OT eval to assess ability to transfer   BPH without LUTS -Continue home Flomax     Increased nutrition need Body mass index is 23.44 kg/m. Nutrition Problem: Inadequate oral intake Etiology: decreased appetite Signs/Symptoms: per patient/family report Interventions: Boost Breeze, Prostat, MVI      Discharge Exam: Vitals:   04/08/21 0432 04/08/21 0802  BP: 101/84   Pulse: 89   Resp: 16   Temp: 98.3 F (36.8 C)   SpO2: 94% 92%  GENERAL: No apparent distress.  Nontoxic. HEENT: MMM.  Vision and hearing grossly intact.  NECK: Supple.  No apparent JVD.  RESP: 97% on 2 L.  No IWOB.  Fair aeration bilaterally. CVS:  RRR. Heart sounds normal.  ABD/GI/GU:  Bowel sounds present. Soft. Non tender.  PERC drain in place. MSK/EXT: Left AKA.  No edema. SKIN: no apparent skin lesion or wound NEURO: Awake and alert.  Fairly oriented.  No apparent focal neuro deficit. PSYCH: Calm. Normal affect.   Discharge Instructions  Discharge Instructions     Diet - low sodium heart healthy   Complete by: As directed    Increase activity slowly   Complete by: As directed    No wound care   Complete by: As directed       Allergies as of 04/08/2021       Reactions   Anesthetics, Amide    Made me crazy and hard to handle.   Doxycycline Shortness Of Breath   Zolpidem    Other reaction(s): Mental Status Changes (intolerance) "combative"   Amoxicillin-pot Clavulanate Rash   Unknown Unknown        Medication List     STOP taking these medications    albuterol 108 (90 Base) MCG/ACT inhaler Commonly known as: VENTOLIN HFA   aspirin EC 81 MG tablet   budesonide-formoterol 160-4.5 MCG/ACT inhaler Commonly known as: SYMBICORT   clopidogrel 75 MG tablet Commonly known as: PLAVIX   diclofenac sodium 1 % Gel Commonly known as: VOLTAREN   lisinopril-hydrochlorothiazide 20-25 MG tablet Commonly known as: ZESTORETIC   polyethylene glycol 17 g packet Commonly known as: MIRALAX / GLYCOLAX       TAKE these medications    acetaminophen 500 MG tablet Commonly known as: TYLENOL Take 500 mg by mouth every 6 (six) hours as needed for moderate pain.   amoxicillin-clavulanate 875-125 MG tablet Commonly known as: AUGMENTIN Take 1 tablet by mouth 2 (two) times daily for 5 days.   apixaban 5 MG Tabs tablet Commonly known as: ELIQUIS Take 1 tablet by mouth in the morning and at bedtime. MD Ordered 1 tablet BID / But according to [facility] , patient seems to only been taking, 1 TABLET DAILY.   cetirizine 10 MG tablet Commonly known as: ZYRTEC Take 1 tablet by mouth daily.   Cholecalciferol 25 MCG (1000 UT) tablet Take 1 tablet by mouth daily.    gabapentin 300 MG capsule Commonly known as: NEURONTIN Take 100 mg by mouth 2 (two) times daily.   glipiZIDE 5 MG tablet Commonly known as: GLUCOTROL Take 5 mg by mouth 2 (two) times daily.   ipratropium-albuterol 0.5-2.5 (3) MG/3ML Soln Commonly known as: DUONEB Take 3 mLs by nebulization every 6 (six) hours as needed (Shortness of breath, cough and/or wheeze).   memantine 5 MG tablet Commonly known as: NAMENDA Take 5 mg by mouth 2 (two) times daily.   multivitamin with minerals tablet Take 1 tablet by mouth daily.   mupirocin ointment 2 % Commonly known as: BACTROBAN APPLY THIN COAT TWICE DAILY   pantoprazole 40 MG tablet Commonly known as: PROTONIX Take 40 mg by mouth daily.   pioglitazone 30 MG tablet Commonly known as: ACTOS   PRESCRIPTION MEDICATION Take 120 mLs by mouth 2 (two) times daily. MED PASS Supplement, may substitute equal amounts of Health Shake if Med Pass isn't available   simvastatin 20 MG tablet Commonly known as: ZOCOR Take 20 mg by mouth at bedtime.   tamsulosin 0.4 MG Caps  capsule Commonly known as: FLOMAX Take 0.4 mg by mouth daily.   Trelegy Ellipta 100-62.5-25 MCG/INH Aepb Generic drug: Fluticasone-Umeclidin-Vilant Inhale 1 puff into the lungs daily.   vitamin B-12 250 MCG tablet Commonly known as: CYANOCOBALAMIN Take 250 mcg by mouth daily.   vitamin C 500 MG tablet Commonly known as: ASCORBIC ACID Take 500 mg by mouth daily.        Consultations: Interventional radiology  Procedures/Studies: Biliary percutaneous drain replacement by interventional radiology on 8/31   CT ABDOMEN PELVIS WO CONTRAST  Result Date: 04/02/2021 CLINICAL DATA:  Abdominal pain acute peritonitis due to dislodged biliary drain. EXAM: CT ABDOMEN AND PELVIS WITHOUT CONTRAST TECHNIQUE: Multidetector CT imaging of the abdomen and pelvis was performed following the standard protocol without IV contrast. COMPARISON:  March 30, 2021 FINDINGS: Lower chest:  Right basilar consolidation with small right pleural effusion. Tiny left pleural effusion. Bibasilar fibrotic lung changes. Aortic atherosclerosis. Coronary artery calcifications. Mitral annulus calcifications. Hepatobiliary: Unremarkable noncontrast appearance of the hepatic parenchyma. Pneumobilia with percutaneous biliary drainage catheter and pigtail coiled in the duodenum. Postsurgical change of cholecystectomy with small volume of fluid in the surgical bed. Central area of high density on image with surrounding soft tissue and mild inflammation measuring 11 mm along the inferior margin of the right lobe of the liver 32/2, slightly more prominent than on prior CT and new from CT February 18, 2021, possibly reflecting inflammation about a dropped gallstone. No walled off fluid collections. Pancreas: Scattered dystrophic pancreatic calcifications sequela of chronic pancreatitis. No pancreatic ductal dilation. Spleen: Within normal limits. Adrenals/Urinary Tract: Bilateral adrenal glands are unremarkable. No hydronephrosis. Calcifications in the bilateral renal hila are favored to be vascular in nature. Urinary bladder is unremarkable for degree of distension. Stomach/Bowel: Stomach is unremarkable for degree of distension. No pathologic dilation of small or large bowel. The appendix and terminal ileum appear normal. Colonic diverticulosis without findings of acute diverticulitis. Vascular/Lymphatic: Severe aortic and branch vessel atherosclerosis without abdominal aortic aneurysm. No pathologically enlarged abdominal or pelvic lymph nodes. Reproductive: Dystrophic prostatic calcifications within an enlarged prostate gland. Other: No significant abdominopelvic ascites or peritoneal thickening. No pneumoperitoneum. Musculoskeletal: Severe multilevel degenerative changes spine. Chronic bilateral L5 pars defects with grade 1 L5 on S1 anterolisthesis. Degenerative changes bilateral hips. Degenerative changes bilateral SI  joints. No acute osseous abnormality. IMPRESSION: 1. Postsurgical change of cholecystectomy with similar stranding in the surgical bed. No walled off fluid collections. 2. Central area of high density with surrounding soft tissue and mild inflammation measuring 11 mm along the inferior margin of the right lobe of the liver, slightly more prominent than on prior CT and new from CT February 18, 2021, possibly reflecting inflammation about a dropped gallstone. 3. No significant abdominopelvic ascites or peritoneal thickening visualized. 4. Similar positioning of the percutaneous biliary drainage catheter with pigtail coiled in the duodenum. 5. Small right greater than left pleural effusions with a right basilar consolidation possibly reflecting infection or atelectasis. 6. Colonic diverticulosis without findings of acute diverticulitis. 7.  Aortic Atherosclerosis (ICD10-I70.0). Electronically Signed   By: Dahlia Bailiff M.D.   On: 04/02/2021 18:00   CT ABDOMEN PELVIS WO CONTRAST  Result Date: 03/30/2021 CLINICAL DATA:  Status post cholecystectomy 3 weeks ago with subsequent removal of biliary tube by the patient which was placed at the time of surgery. EXAM: CT ABDOMEN AND PELVIS WITHOUT CONTRAST TECHNIQUE: Multidetector CT imaging of the abdomen and pelvis was performed following the standard protocol without IV contrast. COMPARISON:  February 22, 2021 FINDINGS: Lower chest: Marked severity chronic interstitial lung disease and peripheral pulmonary fibrosis is noted. There is a small right pleural effusion. Hepatobiliary: No focal liver abnormality is seen. A percutaneous biliary drainage catheter is seen with its distal end noted within the right lobe of the liver, adjacent to the gallbladder fossa. Status post cholecystectomy. No biliary dilatation. Pancreas: Unremarkable. No pancreatic ductal dilatation or surrounding inflammatory changes. Spleen: Normal in size without focal abnormality. Adrenals/Urinary Tract:  Adrenal glands are unremarkable. Kidneys are normal, without obstructing renal calculi, focal lesion, or hydronephrosis. A 4 mm calcification is seen along the medial aspect of the mid left kidney and may be vascular in origin. Bladder is unremarkable. Stomach/Bowel: Stomach is within normal limits. Appendix appears normal. Stool is seen throughout the colon. No evidence of bowel wall thickening, distention, or inflammatory changes. Noninflamed diverticula are seen throughout the large bowel. Vascular/Lymphatic: Aortic atherosclerosis. No enlarged abdominal or pelvic lymph nodes. Reproductive: There is mild to moderate severity prostate gland enlargement. Other: No abdominal wall hernia or abnormality. No abdominopelvic ascites. Musculoskeletal: There is grade 1 anterolisthesis of the L5 vertebral body on S1 with marked severity multilevel degenerative changes. IMPRESSION: 1. Percutaneous biliary drainage catheter positioning, as described above. 2. Small right pleural effusion. 3. Marked severity chronic interstitial lung disease and peripheral pulmonary fibrosis. 4. Colonic diverticulosis. 5. Enlarged prostate gland. 6. Grade 1 anterolisthesis of the L5 vertebral body on S1 with marked severity multilevel degenerative changes. 7. Aortic atherosclerosis. Aortic Atherosclerosis (ICD10-I70.0). Electronically Signed   By: Virgina Norfolk M.D.   On: 03/30/2021 18:32   IR EXCHANGE BILIARY DRAIN  Result Date: 03/31/2021 INDICATION: 87 year old male with a post cholecystectomy bile leak currently treated with an internal/external biliary drainage catheter. The patient fell and partially dislodged tooth catheter. He presents for catheter rescue and exchange. EXAM: Biliary catheter exchange MEDICATIONS: None. ANESTHESIA/SEDATION: Moderate (conscious) sedation was employed during this procedure. A total of Versed 1 mg and Fentanyl 50 mcg was administered intravenously. Moderate Sedation Time: 30 minutes. The patient's  level of consciousness and vital signs were monitored continuously by radiology nursing throughout the procedure under my direct supervision. FLUOROSCOPY TIME:  Fluoroscopy Time: 5 minutes 30 seconds (156 mGy). COMPLICATIONS: None immediate. PROCEDURE: Informed written consent was obtained from the patient after a thorough discussion of the procedural risks, benefits and alternatives. All questions were addressed. Maximal Sterile Barrier Technique was utilized including caps, mask, sterile gowns, sterile gloves, sterile drape, hand hygiene and skin antiseptic. A timeout was performed prior to the initiation of the procedure. Initial fluoroscopic spot imaging demonstrates a biliary drain pulled back into the periphery of the liver. The drainage catheter appears to be kinked on itself. The most proximal sideholes are noted to be external to the body. Contrast injection through the catheter was unsuccessful as the contrast exits the sideholes outside the liver in the peritoneal space. Therefore, the catheter was transected. A wire was passed through the catheter but could not be advanced through the kinked portion. Therefore, an 0 Prolene suture was passed through the external portion of the drainage catheter. A 9 French 35 cm vascular sheath was then modified by removing the hemostatic valve. The sheath was then advanced over the wire and suture combination and over the existing 8.5 [place] biliary drainage catheter. Utilizing the Prolene suture to create tension on the tube, the 9 French sheath was successfully advanced over the tube and into the hepatic parenchyma up to the kinked portion of the drainage catheter.  The catheter was then un kinked allowing passage of the wire through the remaining catheter and into what appeared to represent the central biliary tree. The catheter was then further removed off the wire and through the sheath. A 5 French angled catheter was then advanced over the wire. Contrast injection  was performed confirming that the catheter was located in the duodenum. A Bentson wire was then advanced into the duodenum. The catheter and sheath were removed. A Cook 10.2 [place] biliary drainage catheter was then advanced over the wire and the distal loop formed in the duodenum. A contrast injection was then performed demonstrating patency of the tube which crosses the biliary system and enters the duodenum. The tube was gently flushed and secured to the skin with a combination of 0 Prolene suture and an adhesive fixation device. IMPRESSION: Successful rescue of transhepatic percutaneous biliary access with removal of the partially displaced 8.5 [place] biliary drainage catheter and placement of a new 10.2 French internal/external biliary drain. Electronically Signed   By: Jacqulynn Cadet M.D.   On: 03/31/2021 17:02       The results of significant diagnostics from this hospitalization (including imaging, microbiology, ancillary and laboratory) are listed below for reference.     Microbiology: Recent Results (from the past 240 hour(s))  SARS CORONAVIRUS 2 (TAT 6-24 HRS) Nasopharyngeal Nasopharyngeal Swab     Status: None   Collection Time: 03/30/21  8:55 PM   Specimen: Nasopharyngeal Swab  Result Value Ref Range Status   SARS Coronavirus 2 NEGATIVE NEGATIVE Final    Comment: (NOTE) SARS-CoV-2 target nucleic acids are NOT DETECTED.  The SARS-CoV-2 RNA is generally detectable in upper and lower respiratory specimens during the acute phase of infection. Negative results do not preclude SARS-CoV-2 infection, do not rule out co-infections with other pathogens, and should not be used as the sole basis for treatment or other patient management decisions. Negative results must be combined with clinical observations, patient history, and epidemiological information. The expected result is Negative.  Fact Sheet for Patients: SugarRoll.be  Fact Sheet for Healthcare  Providers: https://www.woods-mathews.com/  This test is not yet approved or cleared by the [place] FDA and  has been authorized for detection and/or diagnosis of SARS-CoV-2 by FDA under an Emergency Use Authorization (EUA). This EUA will remain  in effect (meaning this test can be used) for the duration of the COVID-19 declaration under Se ction 564(b)(1) of the Act, 21 U.S.C. section 360bbb-3(b)(1), unless the authorization is terminated or revoked sooner.  Performed at [facility], [place] [address], [place], [place] [address]   MRSA Next Gen by PCR, Nasal     Status: None   Collection Time: 03/31/21  9:30 PM   Specimen: Nasal Mucosa; Nasal Swab  Result Value Ref Range Status   MRSA by PCR Next Gen NOT DETECTED NOT DETECTED Final    Comment: (NOTE) The GeneXpert MRSA Assay (FDA approved for NASAL specimens only), is one component of a comprehensive MRSA colonization surveillance program. It is not intended to diagnose MRSA infection nor to guide or monitor treatment for MRSA infections. Test performance is not FDA approved in patients less than 74 years old. Performed at [facility], [place] [address], [place], [place] [address]   SARS CORONAVIRUS 2 (TAT 6-24 HRS) Nasopharyngeal Nasopharyngeal Swab     Status: None   Collection Time: 04/05/21  4:24 PM   Specimen: Nasopharyngeal Swab  Result Value Ref Range Status   SARS Coronavirus 2  NEGATIVE NEGATIVE Final    Comment: (NOTE) SARS-CoV-2 target nucleic acids are NOT DETECTED.  The SARS-CoV-2 RNA is generally detectable in upper and lower respiratory specimens during the acute phase of infection. Negative results do not preclude SARS-CoV-2 infection, do not rule out co-infections with other pathogens, and should not be used as the sole basis for treatment or other patient management decisions. Negative results must be combined with clinical observations, patient history, and  epidemiological information. The expected result is Negative.  Fact Sheet for Patients: SugarRoll.be  Fact Sheet for Healthcare Providers: https://www.woods-mathews.com/  This test is not yet approved or cleared by the [place] FDA and  has been authorized for detection and/or diagnosis of SARS-CoV-2 by FDA under an Emergency Use Authorization (EUA). This EUA will remain  in effect (meaning this test can be used) for the duration of the COVID-19 declaration under Se ction 564(b)(1) of the Act, 21 U.S.C. section 360bbb-3(b)(1), unless the authorization is terminated or revoked sooner.  Performed at [facility], [place] [address] [place], [place] [address]   SARS CORONAVIRUS 2 (TAT 6-24 HRS) Nasopharyngeal Nasopharyngeal Swab     Status: None   Collection Time: 04/07/21  5:49 PM   Specimen: Nasopharyngeal Swab  Result Value Ref Range Status   SARS Coronavirus 2 NEGATIVE NEGATIVE Final    Comment: (NOTE) SARS-CoV-2 target nucleic acids are NOT DETECTED.  The SARS-CoV-2 RNA is generally detectable in upper and lower respiratory specimens during the acute phase of infection. Negative results do not preclude SARS-CoV-2 infection, do not rule out co-infections with other pathogens, and should not be used as the sole basis for treatment or other patient management decisions. Negative results must be combined with clinical observations, patient history, and epidemiological information. The expected result is Negative.  Fact Sheet for Patients: SugarRoll.be  Fact Sheet for Healthcare Providers: https://www.woods-mathews.com/  This test is not yet approved or cleared by the [place] FDA and  has been authorized for detection and/or diagnosis of SARS-CoV-2 by FDA under an Emergency Use Authorization (EUA). This EUA will remain  in effect (meaning this test can be used) for the duration of  the COVID-19 declaration under Se ction 564(b)(1) of the Act, 21 U.S.C. section 360bbb-3(b)(1), unless the authorization is terminated or revoked sooner.  Performed at [facility], [place] [address] [place], [place] [address]      Labs:  CBC: Recent Labs  Lab 04/02/21 [phone] 04/03/21 0640 04/04/21 0612 04/04/21 1531 04/05/21 0454 04/06/21 0505  WBC 11.7* 12.7* 11.1*  --  11.0* 10.9*  NEUTROABS  --  8.4*  --   --   --  6.6  HGB 10.1* 11.5* 9.3* 11.4* 10.4* 10.5*  HCT 32.1* 35.8* 28.7* 35.5* 31.7* 32.5*  MCV 100.0 98.4 98.3  --  96.4 96.4  PLT 250 316 317  --  400 495*    BMP &GFR Recent Labs  Lab 04/03/21 0640 04/05/21 0454 04/06/21 0505  NA 134* 136 137  K 4.1 3.2* 3.1*  CL 103 103 101  CO2 '25 26 27  '$ GLUCOSE 153* 232* 140*  BUN 11 9 7*  CREATININE 0.94 0.85 0.71  CALCIUM 9.6 9.3 9.1  MG  --  1.6* 1.6*  PHOS  --   --  2.3*    Estimated Creatinine Clearance: 68.7 mL/min (by C-G formula based on SCr of 0.71 mg/dL). Liver & Pancreas: Recent Labs  Lab 04/06/21 0505  ALBUMIN 2.0*    No results for input(s):  LIPASE, AMYLASE in the last 168 hours.  No results for input(s): AMMONIA in the last 168 hours. Diabetic: No results for input(s): HGBA1C in the last 72 hours. Recent Labs  Lab 04/07/21 2107 04/08/21 0745 04/08/21 0748 04/08/21 0825 04/08/21 0952  GLUCAP 91 48* 55* 61* 192*    Cardiac Enzymes: No results for input(s): CKTOTAL, CKMB, CKMBINDEX, TROPONINI in the last 168 hours. No results for input(s): PROBNP in the last 8760 hours. Coagulation Profile: No results for input(s): INR, PROTIME in the last 168 hours. Thyroid Function Tests: No results for input(s): TSH, T4TOTAL, FREET4, T3FREE, THYROIDAB in the last 72 hours. Lipid Profile: No results for input(s): CHOL, HDL, LDLCALC, TRIG, CHOLHDL, LDLDIRECT in the last 72 hours. Anemia Panel: No results for input(s): VITAMINB12, FOLATE, FERRITIN, TIBC, IRON, RETICCTPCT in the last 72  hours. Urine analysis:    Component Value Date/Time   COLORURINE YELLOW (A) 04/03/2021 1545   APPEARANCEUR CLEAR (A) 04/03/2021 1545   LABSPEC 1.025 04/03/2021 1545   PHURINE 6.0 04/03/2021 1545   GLUCOSEU NEGATIVE 04/03/2021 1545   HGBUR NEGATIVE 04/03/2021 1545   BILIRUBINUR NEGATIVE 04/03/2021 1545   KETONESUR 40 (A) 04/03/2021 1545   PROTEINUR TRACE (A) 04/03/2021 1545   NITRITE NEGATIVE 04/03/2021 1545   LEUKOCYTESUR NEGATIVE 04/03/2021 1545   Sepsis Labs: Invalid input(s): PROCALCITONIN, LACTICIDVEN   Time coordinating discharge: 45 minutes  SIGNED:  Hosie Poisson, MD  [facility] 04/08/2021, 10:44 AM

## 2021-04-08 NOTE — TOC Transition Note (Signed)
Transition of Care [facility]) - CM/SW Discharge Note   Patient Details  Name: William Buchanan MRN: [id number] Date of Birth: 1934/03/08  Transition of Care [facility]) CM/SW Contact:  Lynnell Catalan, RN Phone Number: 04/08/2021, 12:48 PM   Clinical Narrative:     Pt to dc to [facility] today. He is to go to room 3214. [facility] contacted for transport. RN to call report ot [phone].  Final next level of care: [place] Barriers to Discharge: No Barriers Identified   Discharge Placement              Patient chooses bed at: [facility] Patient to be transferred to facility by: [facility]     Readmission Risk Interventions No flowsheet data found.

## 2021-04-14 ENCOUNTER — Non-Acute Institutional Stay: Payer: Medicare (Managed Care)

## 2021-04-14 DIAGNOSIS — Z515 Encounter for palliative care: Secondary | ICD-10-CM

## 2021-04-16 ENCOUNTER — Other Ambulatory Visit: Payer: Self-pay

## 2021-04-19 NOTE — Progress Notes (Signed)
PATIENT NAME: William Buchanan DOB: 01-Feb-1934 MRN: [id number]  PRIMARY CARE PROVIDER: Leota Jacobsen, MD  RESPONSIBLE PARTY:  Acct ID - Guarantor Home Phone Work Phone Relationship Acct Type  [id number] [name], [name]  Self P/F     [place], [place],  [address]    PLAN OF CARE and INTERVENTIONS:               1.  GOALS OF CARE/ ADVANCE CARE PLANNING:  MOST form in place reflects limited interventions.  Phone call made to daughter William Buchanan to establish goals.  At this time, daughter states patient is unable to return home due to the amount of care he requires.  She is open to his return home but he would need to be fairly independent with adl's.  Granddaughter currently lives in patient's home but is not able to offer a lot of assistance.   Patient may require long term placement and I have encouraged her to notify the facility social workers if this is needed.   We discussed the differences between Hospice and Palliative Care. William Buchanan is open to patient having hospice when needed.  Patient believes he may need to remain in the facility for additional care.  In-person visit is scheduled for next Wednesday at the facility.                2.  PATIENT/CAREGIVER EDUCATION:  Hospice vs Palliative Care               4. PERSONAL EMERGENCY PLAN:  Activate 911 for emergencies.               5.  DISEASE STATUS:  Patient found in his room alert and verbal.  Patient is reporting rib cage pain.  Per [name], LPN, patient is now on percocet to manage pain.  Pain medication will be administered to patient by LPN.  Patient notes his recent hospitalization due to dislocated biliary drain.  Dressing to right flank is clean, dry and intact.  Drainage bag continues with green drainage that averages about 500 CC per shift.   Patient recalls his recent hospitalization and fear of falling again.  Re-enforced use of call light to request assistance with ADL's.    HISTORY OF PRESENT ILLNESS:  87  year old male with COPD and Diabetes Mellitus.  Patient is being followed by Palliative Care every 4-8 weeks and prn.   CODE STATUS: DNR ADVANCED DIRECTIVES: No MOST FORM: Yes PPS: 40%   PHYSICAL EXAM:   VITALS: Today's Vitals   04/16/21 0850  PainSc: 4   PainLoc: Rib Cage    LUNGS: clear to auscultation  CARDIAC: Cor RRR}  EXTREMITIES: - for edema SKIN: Skin color, texture, turgor normal. No rashes or lesions or surgical drain in place to right flank.   NEURO: positive for gait problems and weakness       [name], RN

## 2021-04-21 ENCOUNTER — Other Ambulatory Visit: Payer: Self-pay

## 2021-04-21 ENCOUNTER — Non-Acute Institutional Stay: Payer: Medicare (Managed Care)

## 2021-04-21 VITALS — BP 110/80 | HR 100 | Temp 97.3°F | Resp 48

## 2021-04-21 DIAGNOSIS — Z515 Encounter for palliative care: Secondary | ICD-10-CM

## 2021-04-21 NOTE — Progress Notes (Signed)
PATIENT NAME: William Buchanan DOB: [birth year] MRN: [id number]  PRIMARY CARE PROVIDER: Leota Jacobsen, MD  RESPONSIBLE PARTY:  Acct ID - Guarantor Home Phone Work Phone Relationship Acct Type  [id number] TRAVELLE, [phone]  Self P/F     [place], [place], [place] [address]    PLAN OF CARE and INTERVENTIONS:               1.  GOALS OF CARE/ ADVANCE CARE PLANNING:  Met with daughter William Buchanan at the bedside to discuss goals of care.  At this time, she is seeking treatment for depression and pain management.  We discussed the use of an anti-depressant given patient's overall mood, lack of interest in activities and decrease in appetite. Daughter is agreeable and I will follow up with the nurse practioner.   Patient is currently on scheduled tylenol with percocet prn.  We discussed adjusting percocet to a scheduled dose but daughter would like to see if tylenol scheduled and percocet as needed will be enough to manage the rib cage pain but she is open to scheduling percocet if needed.  She will be with the patient for most of the morning and will monitor how patient does.  Patient will need long term placement as he is unable to return home.  I have spoken with William Buchanan, SW and she has advised daughter speak with William Buchanan in the business office regarding the Medicaid application.                 2.  PATIENT/CAREGIVER EDUCATION:  Anti-depressant, Medicaid, and Hospice vs. Palliative Care.               4. PERSONAL EMERGENCY PLAN:  Remain in the facility and avoid hospitalizations.               5.  DISEASE STATUS:  Patient found sitting on the edge of his bed with his head on the bedside table.  Breakfast is present but untouched. Patient reporting pain to his rib cage this am.  Last dose of pain medication was on 9/20 @  11 pm.  William Buchanan, MT is notified of patient's need for pain medications and she returned to patient's room with meds.  Patient is unable to sit up on the edge of the bed due to the  pain.  He is repositioned back in the bed where he reports pain is better but still present.     Daughter present for visit at 9 am.  She confirms desire for patient to remain comfortable. Patient has lost interest in doing therapy and has not participated in the last day or two.  Daughter believes this maybe related to depression.  We discussed if patient continues to decline despite the use of an anti-depressant.  She would consider hospice support as the goal is comfort and no further aggressive treatment.   Spoke with William Cory, LPN for patient.  Discussed anti-depressant and she also has discussed this with patient's daughter.  William Heady, NP will be making rounds on rehab later this am and LPN will speak with her regarding an anti-depressant.   HISTORY OF PRESENT ILLNESS:  87 year old male with HTN, COPD and DM Type 2.   Patient is being followed by Palliative care every 4 weeks and PRN.   CODE STATUS: DNR ADVANCED DIRECTIVES: No MOST FORM: Yes PPS: 40%   PHYSICAL EXAM:   VITALS: Today's Vitals   04/21/21 0845  BP: 110/80  Pulse: 100  Resp: Marland Kitchen)  48  Temp: (!) 97.3 F (36.3 C)  SpO2: 95%  PainSc: 6   PainLoc: Rib Cage    LUNGS: clear to auscultation  CARDIAC: Cor RRR}  EXTREMITIES: no edema, left aka SKIN: poor skin turgor  NEURO: positive for gait problems, memory problems, and weakness       William Burton, RN

## 2021-04-27 ENCOUNTER — Encounter (HOSPITAL_COMMUNITY): Payer: Self-pay

## 2021-04-27 ENCOUNTER — Telehealth: Payer: Self-pay

## 2021-04-27 ENCOUNTER — Emergency Department (HOSPITAL_COMMUNITY): Payer: Medicare (Managed Care)

## 2021-04-27 ENCOUNTER — Other Ambulatory Visit: Payer: Self-pay

## 2021-04-27 ENCOUNTER — Inpatient Hospital Stay (HOSPITAL_COMMUNITY)
Admission: EM | Admit: 2021-04-27 | Discharge: 2021-06-01 | DRG: 193 | Disposition: E | Payer: Medicare (Managed Care) | Source: Skilled Nursing Facility | Attending: Internal Medicine | Admitting: Internal Medicine

## 2021-04-27 DIAGNOSIS — R54 Age-related physical debility: Secondary | ICD-10-CM | POA: Diagnosis present

## 2021-04-27 DIAGNOSIS — Z20822 Contact with and (suspected) exposure to covid-19: Secondary | ICD-10-CM | POA: Diagnosis present

## 2021-04-27 DIAGNOSIS — I252 Old myocardial infarction: Secondary | ICD-10-CM | POA: Diagnosis not present

## 2021-04-27 DIAGNOSIS — Z87891 Personal history of nicotine dependence: Secondary | ICD-10-CM

## 2021-04-27 DIAGNOSIS — K219 Gastro-esophageal reflux disease without esophagitis: Secondary | ICD-10-CM | POA: Diagnosis present

## 2021-04-27 DIAGNOSIS — Z8719 Personal history of other diseases of the digestive system: Secondary | ICD-10-CM

## 2021-04-27 DIAGNOSIS — L8990 Pressure ulcer of unspecified site, unspecified stage: Secondary | ICD-10-CM | POA: Diagnosis present

## 2021-04-27 DIAGNOSIS — Z515 Encounter for palliative care: Secondary | ICD-10-CM

## 2021-04-27 DIAGNOSIS — Z86711 Personal history of pulmonary embolism: Secondary | ICD-10-CM

## 2021-04-27 DIAGNOSIS — E871 Hypo-osmolality and hyponatremia: Secondary | ICD-10-CM | POA: Diagnosis present

## 2021-04-27 DIAGNOSIS — I2583 Coronary atherosclerosis due to lipid rich plaque: Secondary | ICD-10-CM | POA: Diagnosis not present

## 2021-04-27 DIAGNOSIS — E0781 Sick-euthyroid syndrome: Secondary | ICD-10-CM | POA: Diagnosis present

## 2021-04-27 DIAGNOSIS — I2510 Atherosclerotic heart disease of native coronary artery without angina pectoris: Secondary | ICD-10-CM | POA: Diagnosis not present

## 2021-04-27 DIAGNOSIS — I739 Peripheral vascular disease, unspecified: Secondary | ICD-10-CM | POA: Diagnosis not present

## 2021-04-27 DIAGNOSIS — I10 Essential (primary) hypertension: Secondary | ICD-10-CM | POA: Diagnosis not present

## 2021-04-27 DIAGNOSIS — R680 Hypothermia, not associated with low environmental temperature: Secondary | ICD-10-CM | POA: Diagnosis present

## 2021-04-27 DIAGNOSIS — T68XXXD Hypothermia, subsequent encounter: Secondary | ICD-10-CM | POA: Diagnosis not present

## 2021-04-27 DIAGNOSIS — Z955 Presence of coronary angioplasty implant and graft: Secondary | ICD-10-CM

## 2021-04-27 DIAGNOSIS — E1142 Type 2 diabetes mellitus with diabetic polyneuropathy: Secondary | ICD-10-CM | POA: Diagnosis present

## 2021-04-27 DIAGNOSIS — N179 Acute kidney failure, unspecified: Secondary | ICD-10-CM

## 2021-04-27 DIAGNOSIS — E785 Hyperlipidemia, unspecified: Secondary | ICD-10-CM | POA: Diagnosis present

## 2021-04-27 DIAGNOSIS — J9621 Acute and chronic respiratory failure with hypoxia: Secondary | ICD-10-CM | POA: Diagnosis present

## 2021-04-27 DIAGNOSIS — J849 Interstitial pulmonary disease, unspecified: Secondary | ICD-10-CM | POA: Diagnosis present

## 2021-04-27 DIAGNOSIS — D649 Anemia, unspecified: Secondary | ICD-10-CM | POA: Diagnosis present

## 2021-04-27 DIAGNOSIS — T68XXXA Hypothermia, initial encounter: Secondary | ICD-10-CM | POA: Clinically undetermined

## 2021-04-27 DIAGNOSIS — Z09 Encounter for follow-up examination after completed treatment for conditions other than malignant neoplasm: Secondary | ICD-10-CM

## 2021-04-27 DIAGNOSIS — E43 Unspecified severe protein-calorie malnutrition: Secondary | ICD-10-CM | POA: Diagnosis present

## 2021-04-27 DIAGNOSIS — R627 Adult failure to thrive: Secondary | ICD-10-CM | POA: Diagnosis present

## 2021-04-27 DIAGNOSIS — I959 Hypotension, unspecified: Secondary | ICD-10-CM | POA: Diagnosis present

## 2021-04-27 DIAGNOSIS — Z7901 Long term (current) use of anticoagulants: Secondary | ICD-10-CM

## 2021-04-27 DIAGNOSIS — J189 Pneumonia, unspecified organism: Secondary | ICD-10-CM | POA: Diagnosis not present

## 2021-04-27 DIAGNOSIS — Z8673 Personal history of transient ischemic attack (TIA), and cerebral infarction without residual deficits: Secondary | ICD-10-CM

## 2021-04-27 DIAGNOSIS — J432 Centrilobular emphysema: Secondary | ICD-10-CM | POA: Diagnosis present

## 2021-04-27 DIAGNOSIS — Z66 Do not resuscitate: Secondary | ICD-10-CM | POA: Diagnosis present

## 2021-04-27 DIAGNOSIS — E1151 Type 2 diabetes mellitus with diabetic peripheral angiopathy without gangrene: Secondary | ICD-10-CM | POA: Diagnosis present

## 2021-04-27 DIAGNOSIS — I499 Cardiac arrhythmia, unspecified: Secondary | ICD-10-CM | POA: Diagnosis not present

## 2021-04-27 DIAGNOSIS — Z6823 Body mass index (BMI) 23.0-23.9, adult: Secondary | ICD-10-CM | POA: Diagnosis not present

## 2021-04-27 DIAGNOSIS — L89312 Pressure ulcer of right buttock, stage 2: Secondary | ICD-10-CM | POA: Diagnosis present

## 2021-04-27 DIAGNOSIS — Z7189 Other specified counseling: Secondary | ICD-10-CM | POA: Diagnosis not present

## 2021-04-27 DIAGNOSIS — J9601 Acute respiratory failure with hypoxia: Secondary | ICD-10-CM | POA: Diagnosis not present

## 2021-04-27 DIAGNOSIS — E1165 Type 2 diabetes mellitus with hyperglycemia: Secondary | ICD-10-CM | POA: Diagnosis present

## 2021-04-27 DIAGNOSIS — Z89612 Acquired absence of left leg above knee: Secondary | ICD-10-CM | POA: Diagnosis not present

## 2021-04-27 DIAGNOSIS — Z794 Long term (current) use of insulin: Secondary | ICD-10-CM

## 2021-04-27 DIAGNOSIS — J690 Pneumonitis due to inhalation of food and vomit: Secondary | ICD-10-CM | POA: Diagnosis not present

## 2021-04-27 DIAGNOSIS — R410 Disorientation, unspecified: Secondary | ICD-10-CM | POA: Diagnosis present

## 2021-04-27 DIAGNOSIS — R946 Abnormal results of thyroid function studies: Secondary | ICD-10-CM | POA: Diagnosis present

## 2021-04-27 HISTORY — DX: Cholecystitis, unspecified: K81.9

## 2021-04-27 LAB — CBC
HCT: 39.8 % (ref 39.0–52.0)
Hemoglobin: 12.7 g/dL — ABNORMAL LOW (ref 13.0–17.0)
MCH: 30.7 pg (ref 26.0–34.0)
MCHC: 31.9 g/dL (ref 30.0–36.0)
MCV: 96.1 fL (ref 80.0–100.0)
Platelets: 696 10*3/uL — ABNORMAL HIGH (ref 150–400)
RBC: 4.14 MIL/uL — ABNORMAL LOW (ref 4.22–5.81)
RDW: 17.8 % — ABNORMAL HIGH (ref 11.5–15.5)
WBC: 20.2 10*3/uL — ABNORMAL HIGH (ref 4.0–10.5)
nRBC: 0 % (ref 0.0–0.2)

## 2021-04-27 LAB — RESP PANEL BY RT-PCR (FLU A&B, COVID) ARPGX2
Influenza A by PCR: NEGATIVE
Influenza B by PCR: NEGATIVE
SARS Coronavirus 2 by RT PCR: NEGATIVE

## 2021-04-27 LAB — COMPREHENSIVE METABOLIC PANEL
ALT: 13 U/L (ref 0–44)
AST: 19 U/L (ref 15–41)
Albumin: 2.7 g/dL — ABNORMAL LOW (ref 3.5–5.0)
Alkaline Phosphatase: 93 U/L (ref 38–126)
Anion gap: 10 (ref 5–15)
BUN: 62 mg/dL — ABNORMAL HIGH (ref 8–23)
CO2: 21 mmol/L — ABNORMAL LOW (ref 22–32)
Calcium: 11.4 mg/dL — ABNORMAL HIGH (ref 8.9–10.3)
Chloride: 107 mmol/L (ref 98–111)
Creatinine, Ser: 1.54 mg/dL — ABNORMAL HIGH (ref 0.61–1.24)
GFR, Estimated: 44 mL/min — ABNORMAL LOW (ref >60)
Glucose, Bld: 72 mg/dL (ref 70–99)
Potassium: 4.6 mmol/L (ref 3.5–5.1)
Sodium: 138 mmol/L (ref 135–145)
Total Bilirubin: 0.5 mg/dL (ref 0.3–1.2)
Total Protein: 7.2 g/dL (ref 6.5–8.1)

## 2021-04-27 LAB — D-DIMER, QUANTITATIVE: D-Dimer, Quant: 2.25 ug/mL-FEU — ABNORMAL HIGH (ref 0.00–0.50)

## 2021-04-27 LAB — CBG MONITORING, ED
Glucose-Capillary: 74 mg/dL (ref 70–99)
Glucose-Capillary: 73 mg/dL (ref 70–99)

## 2021-04-27 LAB — AMMONIA: Ammonia: 12 umol/L (ref 9–35)

## 2021-04-27 LAB — TSH: TSH: 10.568 u[IU]/mL — ABNORMAL HIGH (ref 0.350–4.500)

## 2021-04-27 LAB — BLOOD GAS, VENOUS
Acid-base deficit: 8.8 mmol/L — ABNORMAL HIGH (ref 0.0–2.0)
Bicarbonate: 15.7 mmol/L — ABNORMAL LOW (ref 20.0–28.0)
O2 Saturation: 76.2 %
Patient temperature: 98.6
pCO2, Ven: 30.6 mmHg — ABNORMAL LOW (ref 44.0–60.0)
pH, Ven: 7.331 (ref 7.250–7.430)
pO2, Ven: 47.5 mmHg — ABNORMAL HIGH (ref 32.0–45.0)

## 2021-04-27 LAB — TROPONIN I (HIGH SENSITIVITY): Troponin I (High Sensitivity): 9 ng/L (ref <18)

## 2021-04-27 LAB — BRAIN NATRIURETIC PEPTIDE: B Natriuretic Peptide: 117.8 pg/mL — ABNORMAL HIGH (ref 0.0–100.0)

## 2021-04-27 MED ORDER — IOHEXOL 350 MG/ML SOLN
80.0000 mL | Freq: Once | INTRAVENOUS | Status: AC | PRN
  Administered 2021-04-27: 80 mL via INTRAVENOUS

## 2021-04-27 MED ORDER — LACTATED RINGERS IV SOLN: INTRAVENOUS | Status: DC

## 2021-04-27 MED ORDER — SODIUM CHLORIDE 0.9 % IV SOLN
2.0000 g | Freq: Once | INTRAVENOUS | Status: AC
  Administered 2021-04-27: 2 g via INTRAVENOUS
  Filled 2021-04-27: qty 2

## 2021-04-27 MED ORDER — VANCOMYCIN HCL 1500 MG/300ML IV SOLN
1500.0000 mg | Freq: Once | INTRAVENOUS | Status: AC
  Administered 2021-04-28: 1500 mg via INTRAVENOUS
  Filled 2021-04-27: qty 300

## 2021-04-27 NOTE — ED Provider Notes (Signed)
[place] DEPT Provider Note   CSN: [id number] Arrival date & time: 04/11/2021  1854     History Chief Complaint  Patient presents with   Altered Mental Status    William Buchanan is a 87 y.o. male.   Altered Mental Status   87 y.o. male with medical history significant for COPD with chronic hypoxemia on 2.5 L, interstitial lung disease, type 2 diabetes, recent PE on Eliquis, PVD s/p left AKA, CVA, CAD s/p stent,hyperlipidemia, Recent gangrenous cholecystitis s/p biliary drain who presents from SNF for increasing shortness of breath and hypoxia.  The history is provided by the patient's daughter. Biliary drain replaced on 8/31, has had significant in his mental status since then. At [facility], they were concern for hyperglycemia >500. He received his home insulin. Was hypoxic at his facility to the low 80s. Progressive decline in his mental status over the past month, but worsened over the past week. Seems more confused, possibly hallucinating, seeing dead friends in the room. He has chronic respiratory failure and is on 2.5L O2 at baseline. Has had decreased appetite over the past few weeks.   Past Medical History:  Diagnosis Date   Arthritis    Cancer ([place])    Complication of anesthesia    Diabetes mellitus without complication ([place])    GERD (gastroesophageal reflux disease)    Hypertension    Myocardial infarction [facility])    Peripheral vascular disease ([place])     Patient Active Problem List   Diagnosis Date Noted   History of cholecystitis 04/28/2021   AKI (acute kidney injury) ([place]) 04/28/2021   PVD (peripheral vascular disease) ([place]) 04/28/2021   CAD (coronary artery disease) 04/28/2021   Pressure injury of skin 04/28/2021   Hypothermia 04/28/2021   Acute on chronic respiratory failure with hypoxia ([place]) 04/13/2021   Pulmonary embolism ([place]) 04/02/2021   Abdominal pain 03/30/2021   Migration of percutaneous transhepatic biliary drain  catheter 03/30/2021   S/P AKA (above knee amputation) unilateral, left ([place]) 03/30/2021   Fall from wheelchair 03/30/2021   Leukocytosis 03/30/2021   COPD (chronic obstructive pulmonary disease) ([place]) 03/30/2021   Hyperkalemia 03/30/2021   Critical lower limb ischemia ([place]) 06/07/2017   Severe protein-calorie malnutrition ([place]) 06/07/2017   Diverticulitis 02/18/2017   Arcus senilis, bilateral 01/10/2017   History of non-ST elevation myocardial infarction (NSTEMI) 12/26/2016   Bilateral carotid artery stenosis 08/25/2016   Benign neoplasm of colon 07/07/2015   Inflammatory polyarthritis ([place]) 07/07/2015   Type 2 diabetes mellitus with diabetic polyneuropathy, without long-term current use of insulin ([place]) 07/07/2015   Carotid atherosclerosis 07/07/2015   Local infection of skin and subcutaneous tissue 09/15/2014   Bunion 09/15/2014   Ulcer of heel and midfoot ([place]) 09/15/2014   Essential hypertension 04/16/2014    Past Surgical History:  Procedure Laterality Date   IR EXCHANGE BILIARY DRAIN  03/31/2021   LEG AMPUTATION ABOVE KNEE Left        History reviewed. No pertinent family history.  Social History   Tobacco Use   Smoking status: Former    Years: 20.00    Types: Cigarettes    Quit date: 1990    Years since quitting: 32.7   Smokeless tobacco: Never  Vaping Use   Vaping Use: Never used  Substance Use Topics   Alcohol use: Not Currently    Comment: occasional, 5 months since any EtOH   Drug use: Not Currently    Home Medications Prior to Admission medications   Medication Sig  Start Date End Date Taking? Authorizing Provider  acetaminophen (TYLENOL) 500 MG tablet Take 500 mg by mouth every 6 (six) hours as needed for moderate pain.   Yes [provider]  apixaban (ELIQUIS) 5 MG TABS tablet Take 1 tablet by mouth in the morning and at bedtime. MD Ordered 1 tablet BID / But according to [facility] , patient seems to only been taking, 1 TABLET DAILY. 03/10/21 06/08/21  Yes [provider]  ascorbic acid (VITAMIN C) 500 MG tablet Take 500 mg by mouth daily.   Yes [provider]  cephALEXin (KEFLEX) 500 MG capsule Take 500 mg by mouth 2 (two) times daily. 04/16/21  Yes [provider]  cetirizine (ZYRTEC) 10 MG tablet Take 1 tablet by mouth daily. 01/20/21  Yes [provider]  Cholecalciferol 25 MCG (1000 UT) tablet Take 1 tablet by mouth daily.   Yes [provider]  ciprofloxacin (CIPRO) 500 MG tablet Take 500 mg by mouth 2 (two) times daily. 04/23/21  Yes [provider]  Fluticasone-Umeclidin-Vilant (TRELEGY ELLIPTA) 100-62.5-25 MCG/INH AEPB Inhale 1 puff into the lungs daily. 04/06/21  Yes Mercy Riding, MD  gabapentin (NEURONTIN) 300 MG capsule Take 100 mg by mouth 2 (two) times daily.   Yes Deloria Lair, NP  glipiZIDE (GLUCOTROL) 5 MG tablet Take 5 mg by mouth 2 (two) times daily. 09/02/14  Yes [provider]  HUMALOG KWIKPEN 100 UNIT/ML KwikPen Inject 3-15 Units into the skin See admin instructions. Sliding Scale  blood sugar 101-150 = 3units, 151-200= 4units, 201-250 = 7units, 251-300 = 9units, 301-350 = 12units, >350 = 15units 04/22/21  Yes [provider]  ipratropium-albuterol (DUONEB) 0.5-2.5 (3) MG/3ML SOLN Take 3 mLs by nebulization every 6 (six) hours as needed (Shortness of breath, cough and/or wheeze). 04/06/21  Yes Mercy Riding, MD  memantine (NAMENDA) 5 MG tablet Take 5 mg by mouth 2 (two) times daily. 03/10/21  Yes [provider]  mirtazapine (REMERON) 15 MG tablet Take 15 mg by mouth at bedtime. 04/22/21  Yes [provider]  Multiple Vitamins-Minerals (MULTIVITAMIN WITH MINERALS) tablet Take 1 tablet by mouth daily.   Yes [provider]  mupirocin ointment (BACTROBAN) 2 % Apply 1 application topically 2 (two) times daily. 10/11/20  Yes [provider]  oxyCODONE-acetaminophen (PERCOCET/ROXICET) 5-325 MG tablet Take 1 tablet by mouth 4 (four)  times daily as needed. 04/12/21  Yes [provider]  pantoprazole sodium (PROTONIX) 40 mg/20 mL SUSP Take 40 mg by mouth daily.   Yes [provider]  pioglitazone (ACTOS) 30 MG tablet  08/25/14  Yes [provider]  PRESCRIPTION MEDICATION Take 120 mLs by mouth 2 (two) times daily. MED PASS Supplement, may substitute equal amounts of Health Shake if Med Pass isn't available   Yes [provider]  simvastatin (ZOCOR) 20 MG tablet Take 20 mg by mouth at bedtime. 09/13/14  Yes [provider]  tamsulosin (FLOMAX) 0.4 MG CAPS capsule Take 0.4 mg by mouth daily. 03/27/21  Yes [provider]  vitamin B-12 (CYANOCOBALAMIN) 250 MCG tablet Take 250 mcg by mouth daily.   Yes [provider]    Allergies    Anesthetics, amide; Doxycycline; Zolpidem; and Amoxicillin-pot clavulanate  Review of Systems   Review of Systems  Unable to perform ROS: Mental status change   Physical Exam Updated Vital Signs BP 115/69 (BP Location: Left Arm)   Pulse 85   Temp (!) 96 F (35.6 C) (Rectal)   Resp  20   Ht 5\' 9"  (1.753 m)   Wt 72 kg   SpO2 96%   BMI 23.44 kg/m   Physical Exam Vitals and nursing note reviewed.  Constitutional:      General: He is not in acute distress.    Appearance: He is well-developed. He is not ill-appearing.  HENT:     Head: Normocephalic and atraumatic.  Eyes:     Conjunctiva/sclera: Conjunctivae normal.  Cardiovascular:     Rate and Rhythm: Normal rate and regular rhythm.     Heart sounds: No murmur heard. Pulmonary:     Effort: Pulmonary effort is normal. Tachypnea present.     Breath sounds: Normal breath sounds.  Abdominal:     Palpations: Abdomen is soft.     Tenderness: There is no abdominal tenderness.     Comments: Biliary drain in place, no erythema  Musculoskeletal:     Cervical back: Neck supple.     Right lower leg: No edema.     Left lower leg: No edema.  Skin:    General: Skin is warm and dry.   Neurological:     Mental Status: He is alert.    ED Results / Procedures / Treatments   Labs (all labs ordered are listed, but only abnormal results are displayed) Labs Reviewed  COMPREHENSIVE METABOLIC PANEL - Abnormal; Notable for the following components:      Result Value   CO2 21 (*)    BUN 62 (*)    Creatinine, Ser 1.54 (*)    Calcium 11.4 (*)    Albumin 2.7 (*)    GFR, Estimated 44 (*)    All other components within normal limits  CBC - Abnormal; Notable for the following components:   WBC 20.2 (*)    RBC 4.14 (*)    Hemoglobin 12.7 (*)    RDW 17.8 (*)    Platelets 696 (*)    All other components within normal limits  D-DIMER, QUANTITATIVE - Abnormal; Notable for the following components:   D-Dimer, Quant 2.25 (*)    All other components within normal limits  BRAIN NATRIURETIC PEPTIDE - Abnormal; Notable for the following components:   B Natriuretic Peptide 117.8 (*)    All other components within normal limits  BLOOD GAS, VENOUS - Abnormal; Notable for the following components:   pCO2, Ven 30.6 (*)    pO2, Ven 47.5 (*)    Bicarbonate 15.7 (*)    Acid-base deficit 8.8 (*)    All other components within normal limits  TSH - Abnormal; Notable for the following components:   TSH 10.568 (*)    All other components within normal limits  CBC - Abnormal; Notable for the following components:   WBC 19.8 (*)    RBC 3.77 (*)    Hemoglobin 11.7 (*)    HCT 36.4 (*)    RDW 17.7 (*)    Platelets 519 (*)    All other components within normal limits  BASIC METABOLIC PANEL - Abnormal; Notable for the following components:   Sodium 132 (*)    Potassium 5.5 (*)    CO2 18 (*)    Glucose, Bld 158 (*)    BUN 53 (*)    Creatinine, Ser 1.31 (*)    Calcium 10.5 (*)    GFR, Estimated 54 (*)    All other components within normal limits  POTASSIUM - Abnormal; Notable for the following components:   Potassium 5.2 (*)    All other  components within normal limits  GLUCOSE,  CAPILLARY - Abnormal; Notable for the following components:   Glucose-Capillary 140 (*)    All other components within normal limits  RESP PANEL BY RT-PCR (FLU A&B, COVID) ARPGX2  MRSA NEXT GEN BY PCR, NASAL  EXPECTORATED SPUTUM ASSESSMENT W GRAM STAIN, RFLX TO RESP C  MRSA NEXT GEN BY PCR, NASAL  URINE CULTURE  URINE CULTURE  CULTURE, BLOOD (ROUTINE X 2)  CULTURE, BLOOD (ROUTINE X 2)  AMMONIA  T4, FREE  LACTIC ACID, PLASMA  LEGIONELLA PNEUMOPHILA SEROGP 1 UR AG  STREP PNEUMONIAE URINARY ANTIGEN  T4, FREE  URINALYSIS, ROUTINE W REFLEX MICROSCOPIC  SODIUM, URINE, RANDOM  CREATININE, URINE, RANDOM  URINALYSIS, ROUTINE W REFLEX MICROSCOPIC  PROCALCITONIN  CBG MONITORING, ED  CBG MONITORING, ED  TROPONIN I (HIGH SENSITIVITY)    EKG EKG Interpretation  Date/Time:  Tuesday April 27 2021 20:23:34 EDT Ventricular Rate:  95 PR Interval:  231 QRS Duration: 125 QT Interval:  354 QTC Calculation: 445 R Axis:   -45 Text Interpretation: Sinus rhythm Multiple premature complexes, vent & supraven Prolonged PR interval RBBB and LAFB Confirmed by Regan Lemming ([id number]) on 04/16/2021 11:01:56 PM  Radiology CT HEAD WO CONTRAST (5MM)  Result Date: 04/09/2021 CLINICAL DATA:  Encephalopathy EXAM: CT HEAD WITHOUT CONTRAST TECHNIQUE: Contiguous axial images were obtained from the base of the skull through the vertex without intravenous contrast. COMPARISON:  02/19/2021 FINDINGS: Brain: There is no mass, hemorrhage or extra-axial collection. There is generalized volume loss. There is hypoattenuation of the white matter, most commonly indicating chronic small vessel disease. Old right frontal and bilateral cerebellar infarcts. Vascular: No abnormal hyperdensity of the major intracranial arteries or dural venous sinuses. No intracranial atherosclerosis. Skull: The visualized skull base, calvarium and extracranial soft tissues are normal. Sinuses/Orbits: No fluid levels or advanced mucosal thickening  of the visualized paranasal sinuses. No mastoid or middle ear effusion. The orbits are normal. IMPRESSION: 1. No acute intracranial abnormality. 2. Old right frontal and bilateral cerebellar infarcts and findings of chronic small vessel disease. Electronically Signed   By: Ulyses Jarred M.D.   On: 04/23/2021 21:09   CT Angio Chest PE W and/or Wo Contrast  Result Date: 04/21/2021 CLINICAL DATA:  PE suspected, low/intermediate prob, positive D-dimer. SOB and AMS EXAM: CT ANGIOGRAPHY CHEST WITH CONTRAST TECHNIQUE: Multidetector CT imaging of the chest was performed using the standard protocol during bolus administration of intravenous contrast. Multiplanar CT image reconstructions and MIPs were obtained to evaluate the vascular anatomy. CONTRAST:  35mL OMNIPAQUE IOHEXOL 350 MG/ML SOLN COMPARISON:  Chest x-ray 04/01/2021, CT angio chest 02/18/2021, CT chest 11/26/2020, CT angiography chest 12/26/2016 FINDINGS: Cardiovascular: Satisfactory opacification of the pulmonary arteries to the segmental level. No evidence of pulmonary embolism. Interval resolution of previously identified right lower lobe subsegmental pulmonary embolus on CT angio chest 02/18/2021. The main pulmonary artery is normal in caliber. Normal heart size. No pericardial effusion. Severe atherosclerotic plaque of the aorta. Four-vessel coronary artery calcifications. Mediastinum/Nodes: No enlarged mediastinal, hilar, or axillary lymph nodes. Thyroid gland, trachea, and esophagus demonstrate no significant findings. Lungs/Pleura: Redemonstration of peripheral cystic changes and reticulations. At least moderate centrilobular emphysematous changes. Stable 2.3 x 2.4 cm subpleural right upper lobe consolidation with cystic changes. Interval development of right lower lobe peribronchovascular subsolid nodular-like densities. Upper Abdomen: Colonic diverticulosis. Transhepatic tube partially visualized status post cholecystectomy no acute abnormality.  Musculoskeletal: No chest wall abnormality. No suspicious lytic or blastic osseous lesions. No acute displaced fracture. Multilevel degenerative  changes of the spine. Review of the MIP images confirms the above findings. IMPRESSION: 1. No pulmonary embolus. 2. Interval development of left lower lobe peribronchovascular ground-glass airspace opacities. Findings suggestive of infection/inflammation. Non-contrast chest CT at 3-6 months is recommended. If nodules persist, subsequent management will be based upon the most suspicious nodule(s). This recommendation follows the consensus statement: Guidelines for Management of Incidental Pulmonary Nodules Detected on CT Images: From the Fleischner Society 2017; Radiology 2017; 284:228-243. 3. Stable 2.3 x 2.4 cm subpleural right upper lobe consolidation with cystic changes. Finding may represent scarring versus cavitary lesion versus atypical infection. 4. Aortic Atherosclerosis (ICD10-I70.0) and Emphysema (ICD10-J43.9). 5. Electronically Signed   By: Iven Finn M.D.   On: 04/20/2021 22:26   CT ABDOMEN PELVIS W CONTRAST  Result Date: 04/28/2021 CLINICAL DATA:  Abdominal abscess or infection suspected. Right abdominal pain. Percutaneous biliary drainage. EXAM: CT ABDOMEN AND PELVIS WITH CONTRAST TECHNIQUE: Multidetector CT imaging of the abdomen and pelvis was performed using the standard protocol following bolus administration of intravenous contrast. CONTRAST:  69mL OMNIPAQUE IOHEXOL 350 MG/ML SOLN COMPARISON:  04/02/2021 FINDINGS: Lower chest: Chest CTA performed few hours earlier. Improved aeration at the right base where there is diminished pleural fluid and better aerated lung. Subpleural pulmonary fibrosis. Atheromatous and cardiac calcification. Hepatobiliary: No focal liver abnormality.Cholecystectomy. Percutaneous biliary drain in stable position, reaching the duodenum. Chronic mild dilatation of intrahepatic ducts in the upper right lobe liver which may  be related to catheter. Pancreas: No acute finding. Diffuse atrophy and chronic ductal dilatation towards the midline. Spleen: Unremarkable. Adrenals/Urinary Tract: Negative adrenals. No hydronephrosis or obstructing stone. Limited for detecting calculi given contrast excretion at time of imaging. Bilateral renal cortical thinning/scarring. Unremarkable bladder. Stomach/Bowel: No obstruction. No evidence of acute inflammation. Left colonic diverticulosis. Vascular/Lymphatic: No acute vascular abnormality. Severe atheromatous calcification of the aorta and branch vessels. No mass or adenopathy. Reproductive:Unremarkable for age Other: No ascites or pneumoperitoneum. High-density nodule at the inferior right liver tip which may reflect gallstone as previously suggested. No convincing adjacent inflammation. Musculoskeletal: No acute abnormalities. Osteopenia and generalized spinal degeneration with mild scoliosis. L5 chronic pars defects with L5-S1 anterolisthesis and severe foraminal impingement. IMPRESSION: 1. No acute finding or abscess.  Stable biliary drain positioning. 2. Numerous chronic findings are described above. Electronically Signed   By: Jorje Guild M.D.   On: 04/28/2021 05:11   DG Chest Portable 1 View  Result Date: 04/14/2021 CLINICAL DATA:  Tachypnea, short of breath EXAM: PORTABLE CHEST 1 VIEW COMPARISON:  02/18/2021 FINDINGS: Single frontal view of the chest demonstrates a stable cardiac silhouette. There is persistent patchy consolidation within the right upper lobe. Chronic background interstitial scarring throughout the lungs, greatest at the bases. No effusion or pneumothorax. No acute bony abnormalities. IMPRESSION: 1. Persistent patchy right upper lobe consolidation, which may reflect pneumonia. 2. Chronic background scarring and fibrosis. Electronically Signed   By: Randa Ngo M.D.   On: 04/18/2021 20:34   US Abdomen Limited RUQ (LIVER/GB)  Result Date: 04/28/2021 CLINICAL  DATA:  Recent gangrenous cholecystitis with percutaneous cholecystostomy in biliary drainage. Increased sanguinous material within the biliary drain back. EXAM: ULTRASOUND ABDOMEN LIMITED RIGHT UPPER QUADRANT COMPARISON:  Preceding abdominal CT FINDINGS: Gallbladder: History of cholecystectomy Common bile duct: Dilated at 13 mm, of doubtful significance given normal biliary labs. There is a traversing catheter which is better seen by CT. No evidence of internal hemorrhage or stone. Liver: The biliary drain is seen traversing the liver parenchyma. No evidence of  mass or collection. Portal vein is patent on color Doppler imaging with normal direction of blood flow towards the liver. IMPRESSION: 1. No acute finding. 2. Percutaneous biliary drain which is normally located by preceding CT. Electronically Signed   By: Jorje Guild M.D.   On: 04/28/2021 06:16    Procedures Procedures   Medications Ordered in ED Medications  oxyCODONE-acetaminophen (PERCOCET/ROXICET) 5-325 MG per tablet 1 tablet (1 tablet Oral Given 04/28/21 0026)  acetaminophen (TYLENOL) tablet 500 mg (has no administration in time range)  simvastatin (ZOCOR) tablet 20 mg (has no administration in time range)  memantine (NAMENDA) tablet 5 mg (5 mg Oral Given 04/28/21 0921)  tamsulosin (FLOMAX) capsule 0.4 mg (0.4 mg Oral Given 04/28/21 0921)  pantoprazole sodium (PROTONIX) 40 mg/20 mL oral suspension 40 mg (40 mg Oral Given 04/28/21 0921)  apixaban (ELIQUIS) tablet 5 mg (5 mg Oral Given 04/28/21 0921)  ceFEPIme (MAXIPIME) 2 g in sodium chloride 0.9 % 100 mL IVPB (has no administration in time range)  MEDLINE mouth rinse (15 mLs Mouth Rinse Given 04/28/21 0926)  0.9 %  sodium chloride infusion ( Intravenous New Bag/Given 04/28/21 0918)  azithromycin (ZITHROMAX) 500 mg in sodium chloride 0.9 % 250 mL IVPB (500 mg Intravenous New Bag/Given 04/28/21 0920)  ascorbic acid (VITAMIN C) tablet 500 mg (500 mg Oral Given 04/28/21 0921)  loratadine  (CLARITIN) tablet 10 mg (10 mg Oral Given 04/28/21 0921)  gabapentin (NEURONTIN) capsule 100 mg (100 mg Oral Given 04/28/21 0921)  mirtazapine (REMERON) tablet 15 mg (has no administration in time range)  ipratropium-albuterol (DUONEB) 0.5-2.5 (3) MG/3ML nebulizer solution 3 mL (3 mLs Nebulization Given 04/28/21 1107)  budesonide (PULMICORT) nebulizer solution 0.5 mg (0.5 mg Nebulization Given 04/28/21 1107)  cholecalciferol (VITAMIN D3) tablet 1,000 Units (1,000 Units Oral Given 04/28/21 0921)  fluticasone (FLONASE) 50 MCG/ACT nasal spray 2 spray (2 sprays Each Nare Given 04/28/21 0922)  vitamin B-12 (CYANOCOBALAMIN) tablet 250 mcg (250 mcg Oral Given 04/28/21 0921)  vancomycin (VANCOCIN) IVPB 1000 mg/200 mL premix (has no administration in time range)  iohexol (OMNIPAQUE) 350 MG/ML injection 80 mL (80 mLs Intravenous Contrast Given 04/17/2021 2140)  ceFEPIme (MAXIPIME) 2 g in sodium chloride 0.9 % 100 mL IVPB (0 g Intravenous Stopped 04/11/2021 2342)  vancomycin (VANCOREADY) IVPB 1500 mg/300 mL (0 mg Intravenous Stopped 04/28/21 0516)  lactated ringers bolus 500 mL (0 mLs Intravenous Stopped 04/28/21 0516)  iohexol (OMNIPAQUE) 350 MG/ML injection 60 mL (60 mLs Intravenous Contrast Given 04/28/21 0442)    ED Course  I have reviewed the triage vital signs and the nursing notes.  Pertinent labs & imaging results that were available during my care of the patient were reviewed by me and considered in my medical decision making (see chart for details).    MDM Rules/Calculators/A&P                           87 y.o. male with medical history significant for COPD with chronic hypoxemia on 2.5 L, interstitial lung disease, type 2 diabetes, recent PE on Eliquis, PVD s/p left AKA, CVA, CAD s/p stent,hyperlipidemia, Recent gangrenous cholecystitis s/p biliary drain who presents from SNF for increasing shortness of breath and hypoxia.  On arrival, the patient was tachyneic, on a NRB. His O2 saturations were  difficulty to obtain but read in the high 90s when a good waveform was appreciated. He presents with new hypoxic respiratory failure from his SNF. Ddx includes PE, PNA, PTX,  ACS, pleural effusion, other intrathoracic abnormality.   VBG on arrival not acidotic, no hypercarbia. EKG without STEMI. WBC notably 20 with a Hgb of 12.7. No significant electrolyte abnormality, Cr 1.54 and BUN 62. Initial troponin of 9. No complaint of CP. BNP 118. CXR revealed pathcy right upper lobe consolidation. Dimer elevated so CTA PE study was ordered. I was unable to wean the patient from his NRB due to persistent tachypnea and difficulty obtaining O2 saturations.   The patient was treated with ABX for CAP coverage.  CTA PE study revealed resolution of prior PE, new LLL peribronchovascular groundglass opacities suggestive of inflammation vs infection. Hospitalist medicine was consulted for admission for hypoxic respiratory failure thought to be 2/2 CAP. COVID PCR negative.   Final Clinical Impression(s) / ED Diagnoses Final diagnoses:  Acute on chronic respiratory failure with hypoxia ([place])  Community acquired pneumonia, unspecified laterality    Rx / DC Orders ED Discharge Orders     None        Regan Lemming, MD 04/28/21 1123

## 2021-04-27 NOTE — ED Triage Notes (Signed)
Pt BIB [facility] from [place] re[facility] facility for [facility]. Patient was short of breath upon EMS arrival. EMS placed patient on 6L. EMS was unable to get o2 reading in route. Patient has hx of hyperglycemia, right AKA, blind in right eye.

## 2021-04-27 NOTE — ED Notes (Signed)
Patients daughter would like a call back Jeronimo Greaves [phone]

## 2021-04-27 NOTE — H&P (Addendum)
History and Physical    MCCRAE SPECIALE GMW:[id number] DOB: 20-Dec-1933 DOA: 04/23/2021  PCP: Leota Jacobsen, MD  Patient coming from: SNF  I have personally briefly reviewed patient's old medical records in [place]  Chief Complaint: Shortness of breath  HPI: William Buchanan is a 87 y.o. male with medical history significant for COPD with chronic hypoxemia on 2.5 L, interstitial lung disease, type 2 diabetes, recent PE on Eliquis, PVD s/p left AKA, CVA, CAD s/p stent,hyperlipidemia, Recent gangrenous cholecystitis s/p biliary drain who presents from SNF for increasing shortness of breath and hypoxia.  Patient unable to provide history.  No family at bedside.  Reports his breathing is not that bad.  He just hurts all over.  Denies any pain around his biliary drain site.  On route with EMS, he was placed on 6 L and placed on 15 L nonrebreather in ED.  There is difficulty obtaining pulse oximetry in this patient likely due to severe PAD.  However, VBG is reassuring without any acidosis or hypercarbia.  WBC of 20, hemoglobin of 12.7, platelet of 696.  Sodium of 138, potassium of 4.6, creatinine of 1.54 from a prior of 0.71 with BUN of 62.   TSH of 10.  BNP of 118.  Troponin of 9.  Chest x-ray shows patchy right upper lobe consolidation which could represent pneumonia.  D-dimer was elevated 2.25. CTA chest obtained showed resolution of previous PE seen in July, there is interval development of left lower lobe peribronchovascular groundglass opacity suggestive of possible inflammation versus infection.  He was started on IV Rocephin and azithromycin and hospitalist was called for admission.  Review of Systems Unable to obtain given patient unable to provide any meaningful history  Past Medical History:  Diagnosis Date   Arthritis    Cancer ([place])    Complication of anesthesia    Diabetes mellitus without complication ([place])    GERD (gastroesophageal reflux disease)     Hypertension    Myocardial infarction [facility])    Peripheral vascular disease ([place])     Past Surgical History:  Procedure Laterality Date   IR EXCHANGE BILIARY DRAIN  03/31/2021   LEG AMPUTATION ABOVE KNEE Left      reports that he quit smoking about 32 years ago. His smoking use included cigarettes. He has never used smokeless tobacco. He reports that he does not currently use alcohol. He reports that he does not currently use drugs. Social History  Allergies  Allergen Reactions   Anesthetics, Amide     Made me crazy and hard to handle.   Doxycycline Shortness Of Breath   Zolpidem     Other reaction(s): Mental Status Changes (intolerance) "combative"   Amoxicillin-Pot Clavulanate Rash         History reviewed. No pertinent family history.   Prior to Admission medications   Medication Sig Start Date End Date Taking? Authorizing Provider  acetaminophen (TYLENOL) 500 MG tablet Take 500 mg by mouth every 6 (six) hours as needed for moderate pain.   Yes [provider]  apixaban (ELIQUIS) 5 MG TABS tablet Take 1 tablet by mouth in the morning and at bedtime. MD Ordered 1 tablet BID / But according to [facility] , patient seems to only been taking, 1 TABLET DAILY. 03/10/21 06/08/21 Yes [provider]  ascorbic acid (VITAMIN C) 500 MG tablet Take 500 mg by mouth daily.   Yes [provider]  cephALEXin (KEFLEX) 500 MG capsule Take 500 mg by mouth  2 (two) times daily. 04/16/21  Yes [provider]  cetirizine (ZYRTEC) 10 MG tablet Take 1 tablet by mouth daily. 01/20/21  Yes [provider]  Cholecalciferol 25 MCG (1000 UT) tablet Take 1 tablet by mouth daily.   Yes [provider]  ciprofloxacin (CIPRO) 500 MG tablet Take 500 mg by mouth 2 (two) times daily. 04/23/21  Yes [provider]  Fluticasone-Umeclidin-Vilant (TRELEGY ELLIPTA) 100-62.5-25 MCG/INH AEPB Inhale 1 puff into the lungs daily. 04/06/21  Yes Mercy Riding, MD  gabapentin  (NEURONTIN) 300 MG capsule Take 100 mg by mouth 2 (two) times daily.   Yes Deloria Lair, NP  glipiZIDE (GLUCOTROL) 5 MG tablet Take 5 mg by mouth 2 (two) times daily. 09/02/14  Yes [provider]  HUMALOG KWIKPEN 100 UNIT/ML KwikPen Inject 3-15 Units into the skin See admin instructions. Sliding Scale  blood sugar 101-150 = 3units, 151-200= 4units, 201-250 = 7units, 251-300 = 9units, 301-350 = 12units, >350 = 15units 04/22/21  Yes [provider]  ipratropium-albuterol (DUONEB) 0.5-2.5 (3) MG/3ML SOLN Take 3 mLs by nebulization every 6 (six) hours as needed (Shortness of breath, cough and/or wheeze). 04/06/21  Yes Mercy Riding, MD  memantine (NAMENDA) 5 MG tablet Take 5 mg by mouth 2 (two) times daily. 03/10/21  Yes [provider]  mirtazapine (REMERON) 15 MG tablet Take 15 mg by mouth at bedtime. 04/22/21  Yes [provider]  Multiple Vitamins-Minerals (MULTIVITAMIN WITH MINERALS) tablet Take 1 tablet by mouth daily.   Yes [provider]  mupirocin ointment (BACTROBAN) 2 % Apply 1 application topically 2 (two) times daily. 10/11/20  Yes [provider]  oxyCODONE-acetaminophen (PERCOCET/ROXICET) 5-325 MG tablet Take 1 tablet by mouth 4 (four) times daily as needed. 04/12/21  Yes [provider]  pantoprazole sodium (PROTONIX) 40 mg/20 mL SUSP Take 40 mg by mouth daily.   Yes [provider]  pioglitazone (ACTOS) 30 MG tablet  08/25/14  Yes [provider]  PRESCRIPTION MEDICATION Take 120 mLs by mouth 2 (two) times daily. MED PASS Supplement, may substitute equal amounts of Health Shake if Med Pass isn't available   Yes [provider]  simvastatin (ZOCOR) 20 MG tablet Take 20 mg by mouth at bedtime. 09/13/14  Yes [provider]  tamsulosin (FLOMAX) 0.4 MG CAPS capsule Take 0.4 mg by mouth daily. 03/27/21  Yes [provider]  vitamin B-12 (CYANOCOBALAMIN) 250 MCG tablet Take 250 mcg by mouth  daily.   Yes [provider]    Physical Exam: Vitals:   04/04/2021 2023 04/10/2021 2116 04/16/2021 2130 04/13/2021 2230  BP: 102/65 102/79 103/83 (!) 159/76  Pulse: 91 95 (!) 49 68  Resp: 19 17 (!) 29 (!) 21  Temp:      TempSrc:      SpO2: 92% 97% 93% 98%  Weight:      Height:        Constitutional: NAD, calm, comfortable, chronically ill-appearing thin cachetic elderly gentleman laying at approximately 40 degree incline in bed on nonrebreather able to speak in full sentences Vitals:   04/10/2021 2023 04/15/2021 2116 04/03/2021 2130 04/20/2021 2230  BP: 102/65 102/79 103/83 (!) 159/76  Pulse: 91 95 (!) 49 68  Resp: 19 17 (!) 29 (!) 21  Temp:      TempSrc:      SpO2: 92% 97% 93% 98%  Weight:      Height:       Eyes: PERRL, lids and conjunctivae normal  ENMT: Mucous membranes are moist.  Neck: normal, supple Respiratory: clear to auscultation bilaterally, no wheezing, no crackles. Normal respiratory effort on 15 L nonrebreather able to speak in full sentences.  No accessory muscle use.  Cardiovascular: Regular rate and rhythm, no murmurs / rubs / gallops. No extremity edema.  Abdomen: no tenderness, no masses palpated.  Bowel sounds positive.  Musculoskeletal: no clubbing / cyanosis. Left AKA, 2nd and 3rd toe amputation on the right Skin: no rashes, lesions, ulcers. No induration Neurologic: CN 2-12 grossly intact.  Psychiatric: Alert and oriented to self and time only.    Labs on Admission: I have personally reviewed following labs and imaging studies  CBC: Recent Labs  Lab 04/15/2021 1907  WBC 20.2*  HGB 12.7*  HCT 39.8  MCV 96.1  PLT 063*   Basic Metabolic Panel: Recent Labs  Lab 04/23/2021 1907  NA 138  K 4.6  CL 107  CO2 21*  GLUCOSE 72  BUN 62*  CREATININE 1.54*  CALCIUM 11.4*   GFR: Estimated Creatinine Clearance: 35.7 mL/min (A) (by C-G formula based on SCr of 1.54 mg/dL (H)). Liver Function Tests: Recent Labs  Lab 04/12/2021 1907  AST 19  ALT 13   ALKPHOS 93  BILITOT 0.5  PROT 7.2  ALBUMIN 2.7*   No results for input(s): LIPASE, AMYLASE in the last 168 hours. Recent Labs  Lab 04/10/2021 2059  AMMONIA 12   Coagulation Profile: No results for input(s): INR, PROTIME in the last 168 hours. Cardiac Enzymes: No results for input(s): CKTOTAL, CKMB, CKMBINDEX, TROPONINI in the last 168 hours. BNP (last 3 results) No results for input(s): PROBNP in the last 8760 hours. HbA1C: No results for input(s): HGBA1C in the last 72 hours. CBG: Recent Labs  Lab 04/28/2021 1922 04/16/2021 2056  GLUCAP 74 73   Lipid Profile: No results for input(s): CHOL, HDL, LDLCALC, TRIG, CHOLHDL, LDLDIRECT in the last 72 hours. Thyroid Function Tests: Recent Labs    04/26/2021 1907  TSH 10.568*   Anemia Panel: No results for input(s): VITAMINB12, FOLATE, FERRITIN, TIBC, IRON, RETICCTPCT in the last 72 hours. Urine analysis:    Component Value Date/Time   COLORURINE YELLOW (A) 04/03/2021 1545   APPEARANCEUR CLEAR (A) 04/03/2021 1545   LABSPEC 1.025 04/03/2021 1545   PHURINE 6.0 04/03/2021 1545   GLUCOSEU NEGATIVE 04/03/2021 1545   HGBUR NEGATIVE 04/03/2021 1545   BILIRUBINUR NEGATIVE 04/03/2021 1545   KETONESUR 40 (A) 04/03/2021 1545   PROTEINUR TRACE (A) 04/03/2021 1545   NITRITE NEGATIVE 04/03/2021 1545   LEUKOCYTESUR NEGATIVE 04/03/2021 1545    Radiological Exams on Admission: CT HEAD WO CONTRAST (5MM)  Result Date: 04/18/2021 CLINICAL DATA:  Encephalopathy EXAM: CT HEAD WITHOUT CONTRAST TECHNIQUE: Contiguous axial images were obtained from the base of the skull through the vertex without intravenous contrast. COMPARISON:  02/19/2021 FINDINGS: Brain: There is no mass, hemorrhage or extra-axial collection. There is generalized volume loss. There is hypoattenuation of the white matter, most commonly indicating chronic small vessel disease. Old right frontal and bilateral cerebellar infarcts. Vascular: No abnormal hyperdensity of the major  intracranial arteries or dural venous sinuses. No intracranial atherosclerosis. Skull: The visualized skull base, calvarium and extracranial soft tissues are normal. Sinuses/Orbits: No fluid levels or advanced mucosal thickening of the visualized paranasal sinuses. No mastoid or middle ear effusion. The orbits are normal. IMPRESSION: 1. No acute intracranial abnormality. 2. Old right frontal and bilateral cerebellar infarcts and findings of chronic small vessel disease. Electronically Signed   By: Lennette Bihari  [place] M.D.   On: 04/20/2021 21:09   CT Angio Chest PE W and/or Wo Contrast  Result Date: 04/19/2021 CLINICAL DATA:  PE suspected, low/intermediate prob, positive D-dimer. SOB and AMS EXAM: CT ANGIOGRAPHY CHEST WITH CONTRAST TECHNIQUE: Multidetector CT imaging of the chest was performed using the standard protocol during bolus administration of intravenous contrast. Multiplanar CT image reconstructions and MIPs were obtained to evaluate the vascular anatomy. CONTRAST:  19mL OMNIPAQUE IOHEXOL 350 MG/ML SOLN COMPARISON:  Chest x-ray 04/08/2021, CT angio chest 02/18/2021, CT chest 11/26/2020, CT angiography chest 12/26/2016 FINDINGS: Cardiovascular: Satisfactory opacification of the pulmonary arteries to the segmental level. No evidence of pulmonary embolism. Interval resolution of previously identified right lower lobe subsegmental pulmonary embolus on CT angio chest 02/18/2021. The main pulmonary artery is normal in caliber. Normal heart size. No pericardial effusion. Severe atherosclerotic plaque of the aorta. Four-vessel coronary artery calcifications. Mediastinum/Nodes: No enlarged mediastinal, hilar, or axillary lymph nodes. Thyroid gland, trachea, and esophagus demonstrate no significant findings. Lungs/Pleura: Redemonstration of peripheral cystic changes and reticulations. At least moderate centrilobular emphysematous changes. Stable 2.3 x 2.4 cm subpleural right upper lobe consolidation with cystic  changes. Interval development of right lower lobe peribronchovascular subsolid nodular-like densities. Upper Abdomen: Colonic diverticulosis. Transhepatic tube partially visualized status post cholecystectomy no acute abnormality. Musculoskeletal: No chest wall abnormality. No suspicious lytic or blastic osseous lesions. No acute displaced fracture. Multilevel degenerative changes of the spine. Review of the MIP images confirms the above findings. IMPRESSION: 1. No pulmonary embolus. 2. Interval development of left lower lobe peribronchovascular ground-glass airspace opacities. Findings suggestive of infection/inflammation. Non-contrast chest CT at 3-6 months is recommended. If nodules persist, subsequent management will be based upon the most suspicious nodule(s). This recommendation follows the consensus statement: Guidelines for Management of Incidental Pulmonary Nodules Detected on CT Images: From the Fleischner Society 2017; Radiology 2017; 284:228-243. 3. Stable 2.3 x 2.4 cm subpleural right upper lobe consolidation with cystic changes. Finding may represent scarring versus cavitary lesion versus atypical infection. 4. Aortic Atherosclerosis (ICD10-I70.0) and Emphysema (ICD10-J43.9). 5. Electronically Signed   By: Iven Finn M.D.   On: 04/12/2021 22:26   DG Chest Portable 1 View  Result Date: 04/29/2021 CLINICAL DATA:  Tachypnea, short of breath EXAM: PORTABLE CHEST 1 VIEW COMPARISON:  02/18/2021 FINDINGS: Single frontal view of the chest demonstrates a stable cardiac silhouette. There is persistent patchy consolidation within the right upper lobe. Chronic background interstitial scarring throughout the lungs, greatest at the bases. No effusion or pneumothorax. No acute bony abnormalities. IMPRESSION: 1. Persistent patchy right upper lobe consolidation, which may reflect pneumonia. 2. Chronic background scarring and fibrosis. Electronically Signed   By: Randa Ngo M.D.   On: 04/01/2021 20:34       Assessment/Plan  Acute on chronic hypoxemic respiratory failure secondary to pneumonia - Baseline at 2.5 L but now requiring 15 L on nonrebreather.  Unfortunately, difficult to obtain peripheral pulse ox on this patient due to severe PAD.  However he had reassuring VBG with normal pH and no hypercarbia.  He was also able to speak in full sentences without any accessory muscle use on exam. -Continue treatment with IV vancomycin and cefepime -obtain sputum cx, urine strep and legionella antigen -Consider pulmonology consult as CT findings could be ILD flareup -Repeat noncontrast chest CT recommended at 3 to 54-month following resolution of symptoms per radiology  Sepsis secondary to pneumonia - Treatment as above - Continue gentle IV fluid hydration  Recent gangrenous cholecystitis s/p biliary drain -  Patient hospitalized from 7/26-8/10 for severe sepsis due to gangrenous cholecystitis s/p PERC drain.  He then had his drain dislodged and was readmitted with sepsis due to possible peritonitis/cholecystitis.  He underwent successful placement of biliary drain on 8/31. -He does not have any tenderness around the [facility] drain site but fluid in bag appears more sanguineous.  Obtain right upper quadrant ultrasound to further evaluate.  AKI -creatinine of 1.54 from 0.71 -follow with continous IV fluids   Elevated TSH -TSH of 10.568. Free T4 - TSH noted to be around 7.43 with dx of subclinical hypothyroidism in April at [facility]  PVD  -s/p left AKA, second and toe amputation of the right -on Eliquis   Hx of recent PE -continue Eliquis  CAD s/p stent -continue statin  HTN controlled not on antihypertensives   Type 2 DM  -controlled   DVT prophylaxis:.Lovenox Code Status: DNR- had documentation from SNF. Pt is currently followed by palliative care Family Communication: No family at bedside  disposition Plan: Home with at least 2 midnight stays  Consults called:   Admission status: inpatient    Level of care: Progressive  Status is: Inpatient  Remains inpatient appropriate because:Inpatient level of care appropriate due to severity of illness  Dispo: The patient is from: SNF              Anticipated d/c is to: SNF              Patient currently is not medically stable to d/c.   Difficult to place patient No         Orene Desanctis DO [facility]   If 7PM-7AM, please contact night-coverage [email or web address]   04/20/2021, 11:43 PM

## 2021-04-27 NOTE — Progress Notes (Signed)
A consult was received from an ED physician for Vancomycin per pharmacy dosing.  The patient's profile has been reviewed for ht/wt/allergies/indication/available labs.   A one time order has been placed for Vancomycin 1500mg  & Cefepime 2gm.  Further antibiotics/pharmacy consults should be ordered by admitting physician if indicated.                       Thank you, Netta Cedars PharmD 04/26/2021  10:53 PM

## 2021-04-27 NOTE — Telephone Encounter (Signed)
04/21/2021 @2  PM: Palliative care SW outreached patients daughter, [name], as a f/u from [employer] RN outreach to discuss Medicaid questions.  Call unsuccessful. SW LVM awaiting return call.

## 2021-04-27 NOTE — Progress Notes (Signed)
Pharmacy Antibiotic Note  ANTAEUS Buchanan is a 87 y.o. male admitted on 04/11/2021 with pneumonia.  Pharmacy has been consulted for Vancomycin + Cefepime dosing.  Plan: Cefepime 2gm IV q12h Vancomycin 750mg  IV q24h to target AUC 400-550 Check Vancomycin levels at steady state MRSA PCR pending- consider d/c Vancomycin if negative Monitor renal function and cx data   Height: 5\' 9"  (175.3 cm) Weight: 72 kg (158 lb 11.7 oz) IBW/kg (Calculated) : 70.7  Temp (24hrs), Avg:97.7 F (36.5 C), Min:97.5 F (36.4 C), Max:97.8 F (36.6 C)  Recent Labs  Lab 04/06/2021 1907  WBC 20.2*  CREATININE 1.54*    Estimated Creatinine Clearance: 35.7 mL/min (A) (by C-G formula based on SCr of 1.54 mg/dL (H)).    Allergies  Allergen Reactions   Anesthetics, Amide     Made me crazy and hard to handle.   Doxycycline Shortness Of Breath   Zolpidem     Other reaction(s): Mental Status Changes (intolerance) "combative"   Amoxicillin-Pot Clavulanate Rash         Antimicrobials this admission: 9/27 Cefepime >>  9/28 Vancomycin >>   Dose adjustments this admission:  Microbiology results: 9/28 MRSA PCR:  9/27 Resp PCR negative for COVID & Influenza  Thank you for allowing pharmacy to be a part of this patient's care.  Netta Cedars PharmD 04/28/2021 11:52 PM

## 2021-04-28 ENCOUNTER — Inpatient Hospital Stay (HOSPITAL_COMMUNITY): Payer: Medicare (Managed Care)

## 2021-04-28 ENCOUNTER — Encounter (HOSPITAL_COMMUNITY): Payer: Self-pay | Admitting: Family Medicine

## 2021-04-28 DIAGNOSIS — J9601 Acute respiratory failure with hypoxia: Secondary | ICD-10-CM | POA: Diagnosis not present

## 2021-04-28 DIAGNOSIS — T68XXXD Hypothermia, subsequent encounter: Secondary | ICD-10-CM

## 2021-04-28 DIAGNOSIS — N179 Acute kidney failure, unspecified: Secondary | ICD-10-CM | POA: Diagnosis not present

## 2021-04-28 DIAGNOSIS — J690 Pneumonitis due to inhalation of food and vomit: Secondary | ICD-10-CM | POA: Diagnosis not present

## 2021-04-28 DIAGNOSIS — R627 Adult failure to thrive: Secondary | ICD-10-CM | POA: Diagnosis not present

## 2021-04-28 DIAGNOSIS — Z7189 Other specified counseling: Secondary | ICD-10-CM

## 2021-04-28 DIAGNOSIS — I739 Peripheral vascular disease, unspecified: Secondary | ICD-10-CM

## 2021-04-28 DIAGNOSIS — J189 Pneumonia, unspecified organism: Secondary | ICD-10-CM

## 2021-04-28 DIAGNOSIS — Z8719 Personal history of other diseases of the digestive system: Secondary | ICD-10-CM

## 2021-04-28 DIAGNOSIS — L8990 Pressure ulcer of unspecified site, unspecified stage: Secondary | ICD-10-CM | POA: Diagnosis present

## 2021-04-28 DIAGNOSIS — T68XXXA Hypothermia, initial encounter: Secondary | ICD-10-CM | POA: Clinically undetermined

## 2021-04-28 DIAGNOSIS — J9621 Acute and chronic respiratory failure with hypoxia: Secondary | ICD-10-CM | POA: Diagnosis not present

## 2021-04-28 DIAGNOSIS — I2510 Atherosclerotic heart disease of native coronary artery without angina pectoris: Secondary | ICD-10-CM

## 2021-04-28 LAB — T4, FREE
Free T4: 0.81 ng/dL (ref 0.61–1.12)
Free T4: 0.94 ng/dL (ref 0.61–1.12)

## 2021-04-28 LAB — BASIC METABOLIC PANEL
Anion gap: 8 (ref 5–15)
BUN: 53 mg/dL — ABNORMAL HIGH (ref 8–23)
CO2: 18 mmol/L — ABNORMAL LOW (ref 22–32)
Calcium: 10.5 mg/dL — ABNORMAL HIGH (ref 8.9–10.3)
Chloride: 106 mmol/L (ref 98–111)
Creatinine, Ser: 1.31 mg/dL — ABNORMAL HIGH (ref 0.61–1.24)
GFR, Estimated: 54 mL/min — ABNORMAL LOW (ref >60)
Glucose, Bld: 158 mg/dL — ABNORMAL HIGH (ref 70–99)
Potassium: 5.5 mmol/L — ABNORMAL HIGH (ref 3.5–5.1)
Sodium: 132 mmol/L — ABNORMAL LOW (ref 135–145)

## 2021-04-28 LAB — BLOOD GAS, VENOUS
Acid-base deficit: 7.5 mmol/L — ABNORMAL HIGH (ref 0.0–2.0)
Bicarbonate: 18.2 mmol/L — ABNORMAL LOW (ref 20.0–28.0)
FIO2: 21
O2 Saturation: 36.6 %
Patient temperature: 98.6
pCO2, Ven: 39.6 mmHg — ABNORMAL LOW (ref 44.0–60.0)
pH, Ven: 7.285 (ref 7.250–7.430)
pO2, Ven: <31 mmHg — CL (ref 32.0–45.0)

## 2021-04-28 LAB — CBC
HCT: 36.4 % — ABNORMAL LOW (ref 39.0–52.0)
Hemoglobin: 11.7 g/dL — ABNORMAL LOW (ref 13.0–17.0)
MCH: 31 pg (ref 26.0–34.0)
MCHC: 32.1 g/dL (ref 30.0–36.0)
MCV: 96.6 fL (ref 80.0–100.0)
Platelets: 519 10*3/uL — ABNORMAL HIGH (ref 150–400)
RBC: 3.77 MIL/uL — ABNORMAL LOW (ref 4.22–5.81)
RDW: 17.7 % — ABNORMAL HIGH (ref 11.5–15.5)
WBC: 19.8 10*3/uL — ABNORMAL HIGH (ref 4.0–10.5)
nRBC: 0 % (ref 0.0–0.2)

## 2021-04-28 LAB — CREATININE, URINE, RANDOM: Creatinine, Urine: 60.34 mg/dL

## 2021-04-28 LAB — MRSA NEXT GEN BY PCR, NASAL
MRSA by PCR Next Gen: NOT DETECTED
MRSA by PCR Next Gen: NOT DETECTED

## 2021-04-28 LAB — GLUCOSE, CAPILLARY: Glucose-Capillary: 140 mg/dL — ABNORMAL HIGH (ref 70–99)

## 2021-04-28 LAB — URINALYSIS, ROUTINE W REFLEX MICROSCOPIC
Bilirubin Urine: NEGATIVE
Glucose, UA: NEGATIVE mg/dL
Hgb urine dipstick: NEGATIVE
Ketones, ur: 15 mg/dL — AB
Leukocytes,Ua: NEGATIVE
Nitrite: NEGATIVE
Protein, ur: NEGATIVE mg/dL
Specific Gravity, Urine: 1.015 (ref 1.005–1.030)
pH: 5.5 (ref 5.0–8.0)

## 2021-04-28 LAB — LACTIC ACID, PLASMA: Lactic Acid, Venous: 1.3 mmol/L (ref 0.5–1.9)

## 2021-04-28 LAB — PROCALCITONIN: Procalcitonin: <0.1 ng/mL

## 2021-04-28 LAB — SODIUM, URINE, RANDOM: Sodium, Ur: 30 mmol/L

## 2021-04-28 LAB — POTASSIUM: Potassium: 5.2 mmol/L — ABNORMAL HIGH (ref 3.5–5.1)

## 2021-04-28 MED ORDER — IPRATROPIUM-ALBUTEROL 0.5-2.5 (3) MG/3ML IN SOLN
3.0000 mL | Freq: Four times a day (QID) | RESPIRATORY_TRACT | Status: DC
  Administered 2021-04-28 – 2021-04-29 (×5): 3 mL via RESPIRATORY_TRACT
  Filled 2021-04-28 (×3): qty 3

## 2021-04-28 MED ORDER — FLUTICASONE PROPIONATE 50 MCG/ACT NA SUSP
2.0000 | Freq: Every day | NASAL | Status: DC
  Administered 2021-04-28 – 2021-04-29 (×2): 2 via NASAL
  Filled 2021-04-28 (×2): qty 16

## 2021-04-28 MED ORDER — TAMSULOSIN HCL 0.4 MG PO CAPS
0.4000 mg | ORAL_CAPSULE | Freq: Every day | ORAL | Status: DC
  Administered 2021-04-28 – 2021-04-30 (×2): 0.4 mg via ORAL
  Filled 2021-04-28 (×4): qty 1

## 2021-04-28 MED ORDER — GABAPENTIN 300 MG PO CAPS
300.0000 mg | ORAL_CAPSULE | Freq: Two times a day (BID) | ORAL | Status: DC
  Administered 2021-04-28: 300 mg via ORAL
  Filled 2021-04-28: qty 1

## 2021-04-28 MED ORDER — CHLORHEXIDINE GLUCONATE CLOTH 2 % EX PADS
6.0000 | MEDICATED_PAD | Freq: Every day | CUTANEOUS | Status: DC
  Administered 2021-04-28 – 2021-05-03 (×3): 6 via TOPICAL

## 2021-04-28 MED ORDER — ACETAMINOPHEN 500 MG PO TABS
500.0000 mg | ORAL_TABLET | Freq: Four times a day (QID) | ORAL | Status: DC | PRN
  Administered 2021-04-28 – 2021-04-30 (×3): 500 mg via ORAL
  Filled 2021-04-28 (×3): qty 1

## 2021-04-28 MED ORDER — ASCORBIC ACID 500 MG PO TABS
500.0000 mg | ORAL_TABLET | Freq: Every day | ORAL | Status: DC
  Administered 2021-04-28 – 2021-04-30 (×2): 500 mg via ORAL
  Filled 2021-04-28 (×3): qty 1

## 2021-04-28 MED ORDER — PANTOPRAZOLE 2 MG/ML SUSPENSION
40.0000 mg | Freq: Every day | ORAL | Status: DC
  Administered 2021-04-28: 40 mg via ORAL
  Filled 2021-04-28 (×6): qty 20

## 2021-04-28 MED ORDER — ADULT MULTIVITAMIN W/MINERALS CH
1.0000 | ORAL_TABLET | Freq: Every day | ORAL | Status: DC
  Administered 2021-04-28 – 2021-04-30 (×2): 1 via ORAL
  Filled 2021-04-28 (×3): qty 1

## 2021-04-28 MED ORDER — LACTATED RINGERS IV BOLUS
500.0000 mL | Freq: Once | INTRAVENOUS | Status: AC
  Administered 2021-04-28: 500 mL via INTRAVENOUS

## 2021-04-28 MED ORDER — BOOST / RESOURCE BREEZE PO LIQD CUSTOM: 1.0000 | ORAL | Status: DC

## 2021-04-28 MED ORDER — PROSOURCE PLUS PO LIQD
30.0000 mL | Freq: Every day | ORAL | Status: DC
  Administered 2021-04-28: 30 mL via ORAL
  Filled 2021-04-28: qty 30

## 2021-04-28 MED ORDER — SIMVASTATIN 10 MG PO TABS
20.0000 mg | ORAL_TABLET | Freq: Every day | ORAL | Status: DC
  Administered 2021-04-28: 20 mg via ORAL
  Filled 2021-04-28: qty 2

## 2021-04-28 MED ORDER — VITAMIN D 25 MCG (1000 UNIT) PO TABS
1000.0000 [IU] | ORAL_TABLET | Freq: Every day | ORAL | Status: DC
  Administered 2021-04-28 – 2021-04-30 (×2): 1000 [IU] via ORAL
  Filled 2021-04-28 (×3): qty 1

## 2021-04-28 MED ORDER — ENSURE ENLIVE PO LIQD
237.0000 mL | ORAL | Status: DC
  Administered 2021-04-28 – 2021-04-29 (×2): 237 mL via ORAL

## 2021-04-28 MED ORDER — OXYCODONE-ACETAMINOPHEN 5-325 MG PO TABS
1.0000 | ORAL_TABLET | Freq: Four times a day (QID) | ORAL | Status: DC | PRN
  Administered 2021-04-28 – 2021-05-01 (×10): 1 via ORAL
  Filled 2021-04-28 (×10): qty 1

## 2021-04-28 MED ORDER — SODIUM CHLORIDE 0.9 % IV BOLUS: 500.0000 mL | Freq: Once | INTRAVENOUS | Status: DC

## 2021-04-28 MED ORDER — CHOLECALCIFEROL 25 MCG (1000 UT) PO TABS: 1.0000 | ORAL_TABLET | Freq: Every day | ORAL | Status: DC

## 2021-04-28 MED ORDER — SODIUM CHLORIDE 0.9 % IV SOLN: INTRAVENOUS | Status: AC

## 2021-04-28 MED ORDER — BUDESONIDE 0.5 MG/2ML IN SUSP
0.5000 mg | Freq: Two times a day (BID) | RESPIRATORY_TRACT | Status: DC
  Administered 2021-04-28 – 2021-05-02 (×9): 0.5 mg via RESPIRATORY_TRACT
  Filled 2021-04-28 (×9): qty 2

## 2021-04-28 MED ORDER — VANCOMYCIN HCL 750 MG/150ML IV SOLN: 750.0000 mg | INTRAVENOUS | Status: DC

## 2021-04-28 MED ORDER — SODIUM CHLORIDE 0.9 % IV SOLN
2.0000 g | Freq: Two times a day (BID) | INTRAVENOUS | Status: DC
  Administered 2021-04-28 – 2021-04-29 (×3): 2 g via INTRAVENOUS
  Filled 2021-04-28 (×3): qty 2

## 2021-04-28 MED ORDER — GABAPENTIN 100 MG PO CAPS
100.0000 mg | ORAL_CAPSULE | Freq: Two times a day (BID) | ORAL | Status: DC
  Administered 2021-04-28 – 2021-05-01 (×5): 100 mg via ORAL
  Filled 2021-04-28 (×7): qty 1

## 2021-04-28 MED ORDER — ORAL CARE MOUTH RINSE
15.0000 mL | Freq: Two times a day (BID) | OROMUCOSAL | Status: DC
  Administered 2021-04-28 – 2021-05-04 (×10): 15 mL via OROMUCOSAL

## 2021-04-28 MED ORDER — VITAMIN B-12 250 MCG PO TABS: 250.0000 ug | ORAL_TABLET | Freq: Every day | ORAL | Status: DC

## 2021-04-28 MED ORDER — IOHEXOL 350 MG/ML SOLN
60.0000 mL | Freq: Once | INTRAVENOUS | Status: AC | PRN
  Administered 2021-04-28: 60 mL via INTRAVENOUS

## 2021-04-28 MED ORDER — VANCOMYCIN HCL IN DEXTROSE 1-5 GM/200ML-% IV SOLN: 1000.0000 mg | INTRAVENOUS | Status: DC

## 2021-04-28 MED ORDER — LORATADINE 10 MG PO TABS
10.0000 mg | ORAL_TABLET | Freq: Every day | ORAL | Status: DC
  Administered 2021-04-28 – 2021-04-30 (×2): 10 mg via ORAL
  Filled 2021-04-28 (×3): qty 1

## 2021-04-28 MED ORDER — APIXABAN 5 MG PO TABS
5.0000 mg | ORAL_TABLET | Freq: Two times a day (BID) | ORAL | Status: DC
  Administered 2021-04-28 (×3): 5 mg via ORAL
  Filled 2021-04-28 (×5): qty 1

## 2021-04-28 MED ORDER — MIRTAZAPINE 15 MG PO TABS
15.0000 mg | ORAL_TABLET | Freq: Every day | ORAL | Status: DC
  Administered 2021-04-28: 15 mg via ORAL
  Filled 2021-04-28: qty 1

## 2021-04-28 MED ORDER — MIRTAZAPINE 15 MG PO TABS
15.0000 mg | ORAL_TABLET | Freq: Every day | ORAL | Status: DC
  Administered 2021-04-28 – 2021-05-01 (×3): 15 mg via ORAL
  Filled 2021-04-28 (×4): qty 1

## 2021-04-28 MED ORDER — MEMANTINE HCL 10 MG PO TABS
5.0000 mg | ORAL_TABLET | Freq: Two times a day (BID) | ORAL | Status: DC
  Administered 2021-04-28 – 2021-04-30 (×5): 5 mg via ORAL
  Filled 2021-04-28 (×6): qty 1

## 2021-04-28 MED ORDER — SODIUM CHLORIDE 0.9 % IV SOLN
500.0000 mg | INTRAVENOUS | Status: DC
  Administered 2021-04-28 – 2021-04-29 (×2): 500 mg via INTRAVENOUS
  Filled 2021-04-28 (×2): qty 500

## 2021-04-28 MED ORDER — CYANOCOBALAMIN 500 MCG PO TABS
250.0000 ug | ORAL_TABLET | Freq: Every day | ORAL | Status: DC
  Administered 2021-04-28: 250 ug via ORAL
  Filled 2021-04-28 (×4): qty 1

## 2021-04-28 NOTE — Evaluation (Signed)
Clinical/Bedside Swallow Evaluation Patient Details  Name: William Buchanan MRN: [id number] Date of Birth: 1933-10-30  Today's Date: 04/28/2021 Time: SLP Start Time (ACUTE ONLY): 23 SLP Stop Time (ACUTE ONLY): 1529 SLP Time Calculation (min) (ACUTE ONLY): 14 min  Past Medical History:  Past Medical History:  Diagnosis Date   Arthritis    Cancer ([place])    Complication of anesthesia    Diabetes mellitus without complication ([place])    GERD (gastroesophageal reflux disease)    Hypertension    Myocardial infarction ([place])    Peripheral vascular disease ([place])    Past Surgical History:  Past Surgical History:  Procedure Laterality Date   IR EXCHANGE BILIARY DRAIN  03/31/2021   LEG AMPUTATION ABOVE KNEE Left    HPI:  87 y.o. male presents from [facility] for increasing shortness of breath and hypoxia. Dx sepsis and pna. PMHx significant for COPD with chronic hypoxemia on 2.5 L, interstitial lung disease, type 2 diabetes, recent PE on Eliquis, PVD s/p left AKA, CVA, CAD s/p stent,hyperlipidemia, GERD, Recent gangrenous cholecystitis s/p biliary drain. CT head 9/27: No acute intracranial abnormality. Old right frontal and bilateral cerebellar infarcts and findings of chronic small vessel disease.   Chest x-ray 9/27 shows patchy right upper lobe consolidation which could represent pneumonia. Pt with decline in mental status the last month per notes: "Seems more confused, possibly hallucinating, seeing dead friends in the room."    Assessment / Plan / Recommendation  Clinical Impression  Pt presents with functional swallowing.  Oral mechanism exam is normal; pt is edentulous; no CN deficits. Minimal appetite this afternoon, but willing to eat some food items. Demonstrated adequate mastication, brisk swallow response, no s/s of aspiration with solids, purees or thin liquids.  Pt needed assistance with utensil but could hold a cup independently.   Appeared to coordinate swallow/respiratory  cycles without difficulty.  No dysphagia present. Recommend continuing dysphagia 3 diet; thin liquids; give meds whole in applesauce if he coughs with thin liquids.    Pt's cognition was also screened - demonstrated orientation to person and elements of time.  Receptive/expressive language WNL. Speech without dysarthria and fluent.  Followed commands. Functional attention. Demonstrated difficulty with short-term recall and reasoning.  He may benefit from further cognitive assessment once he transitions back to SNF.  No acute care needs identified that require SLP f/u. Our service will sign off. SLP Visit Diagnosis: Dysphagia, unspecified (R13.10)    Aspiration Risk  No limitations    Diet Recommendation   Dysphagia 3/thin liquids  Medication Administration: Whole meds with liquid    Other  Recommendations Oral Care Recommendations: Oral care BID    Recommendations for follow up therapy are one component of a multi-disciplinary discharge planning process, led by the attending physician.  Recommendations may be updated based on patient status, additional functional criteria and insurance authorization.  Follow up Recommendations Skilled Nursing facility        Swallow Study   General HPI: 87 y.o. male presents from [facility] SNF for increasing shortness of breath and hypoxia. Dx sepsis and pna. PMHx significant for COPD with chronic hypoxemia on 2.5 L, interstitial lung disease, type 2 diabetes, recent PE on Eliquis, PVD s/p left AKA, CVA, CAD s/p stent,hyperlipidemia, GERD, Recent gangrenous cholecystitis s/p biliary drain. CT head 9/27: No acute intracranial abnormality. Old right frontal and bilateral cerebellar infarcts and findings of chronic small vessel disease.   Chest x-ray 9/27 shows patchy right upper lobe consolidation which could represent pneumonia. Pt  with decline in mental status the last month per notes: "Seems more confused, possibly hallucinating, seeing dead friends in the  room." Type of Study: Bedside Swallow Evaluation Previous Swallow Assessment: no Diet Prior to this Study: Dysphagia 3 (soft);Thin liquids Temperature Spikes Noted: No (low temp earlier today on [employer]) Respiratory Status: Nasal cannula History of Recent Intubation: No Behavior/Cognition: Alert;Cooperative;Confused Oral Cavity Assessment: Within Functional Limits Oral Care Completed by SLP: Recent completion by staff Oral Cavity - Dentition: Edentulous Vision: Functional for self-feeding Self-Feeding Abilities: Needs assist Patient Positioning: Upright in bed Baseline Vocal Quality: Normal Volitional Cough: Strong    Oral/Motor/Sensory Function Overall Oral Motor/Sensory Function: Within functional limits   Ice Chips Ice chips: Within functional limits   Thin Liquid Thin Liquid: Within functional limits    Nectar Thick Nectar Thick Liquid: Not tested   Honey Thick Honey Thick Liquid: Not tested   Puree Puree: Within functional limits   Solid     Solid: Within functional limits     William Buchanan L. Tivis Ringer, [place] Office number [phone] Pager [phone]  William Buchanan 04/28/2021,3:39 PM

## 2021-04-28 NOTE — Progress Notes (Signed)
[facility]) Hospital Liaison note:  This patient is currently enrolled in [facility] outpatient-based Palliative Care. Will continue to follow for disposition.  Please call with any outpatient palliative questions or concerns.  Thank you, Lorelee Market, LPN [facility] Liaison [phone]

## 2021-04-28 NOTE — Progress Notes (Signed)
Daughter, Coralyn Mark, updated on pt status and new room in stepdown unit.

## 2021-04-28 NOTE — Progress Notes (Signed)
Called d/t floor RN concerned of how pt was breathing. See VS.  Called [facility]  to evaluate (MD and NP at bedside) discussed pt drain and new orders received and initiated.  Pt much more awake, bp soft, bolus given per orders. Pt temperature low blankets applied RN to continue to monitor and report.  Thanks

## 2021-04-28 NOTE — Consult Note (Signed)
NAME:  William Buchanan, MRN:  [id number], DOB:  01/15/1934, LOS: 1 ADMISSION DATE:  04/05/2021, CONSULTATION DATE:  04/28/2021 REFERRING MD:  Dr. Grandville Silos, CHIEF COMPLAINT:  hypoxia/ abnormal CT   History of Present Illness:  87 year old male with PMH of centrilobular emphysema, chronic hypoxic respiratory failure on 2L O2, PE on Eliquis (02/19/21 at [facility]), HTN, DMT2, CAD, MI, PVD, GERD, IDA, left AKA, and gangrenous cholecystitis s/p cholecystectomy 0/9/98 complicated by bile leak s/p perc biliary drain [facility]) 02/24/21 s/p successful rescue of partially displaced drain w/ [facility] 03/31/21 who presented to [facility] on 9/[facility] from SNF with increasing shortness of breath and hypoxia.    Most of HPI obtained from chart review as patient is now very sleepy and worn out.  Per bedside RN, he mostly has been alert and cooperative, as he was able to fully participate in speech evaluation.   On chart review, patient has RUL cavitation dating back to April 2022, seen again in July at [facility], and per notes had negative AFB and patient not interested in further workup/ biopsy.   Noted also on prior chest CT with background emphysema with subpleural reticulation and honeycombing on the right (with peripheral subpleural fibrosis noted on CT in 2018), concerning for NSIP but with negative serologic workup, previously treated with steroids for ILD appearing changes and COPD in July.   On workup, initial pulse oximetry is unknown, as difficulty to get reading, ended up on NRB with EMS.  Labs noted for WBC 20, AKI, BNP 118, and CXR showing patchy RUL consolidation.  Ddimer elevated therefore CTA obtained which showed resolution of prior PE but interval development of LLL peribronchovascular groundglass opacity concerning for inflammation versus infection.  He was started on azithro and ceftriaxone and admitted to [facility].  Given concern for possible ILD flare, pulmonary consulted.   Of note, since admission, he has been  hypothermic, hypotensive but responded to fluids.   He remains a DNR/ DNI.  Pertinent  Medical History  Centrilobular emphysema, chronic hypoxic respiratory failure on 2.5L O2, ILD,  recent PE on Eliquis (02/19/21 at [facility]), HTN, DMT2, CAD, MI, PVD, CVA/ SAH, IDA, GERD, left AKA, gangrenous cholecystitis s/p cholecystectomy 10/01/80 complicated by bile leak s/p perc biliary drain [facility]) 02/24/21 s/p successful rescue of partially displaced drain w/ [facility] 03/31/21  DNI/ DNR Significant Hospital Events: Including procedures, antibiotic start and stop dates in addition to other pertinent events   9/27 admitted to [facility] with sepsis secondary to pneumonia, acute on chronic hypoxemic respiratory failure 9/28  Pulmonary consult   Interim History / Subjective:   Objective   Blood pressure 105/76, pulse 85, temperature (!) 97.4 F (36.3 C), temperature source Axillary, resp. rate (!) 32, height 5\' 9"  (1.753 m), weight 72 kg, SpO2 96 %.        Intake/Output Summary (Last 24 hours) at 04/28/2021 1439 Last data filed at 04/28/2021 1300 Gross per 24 hour  Intake 1640.5 ml  Output 515 ml  Net 1125.5 ml   Filed Weights   04/19/2021 1906  Weight: 72 kg   Examination: General:  chronically ill appearing elderly male sleeping  HEENT: MM pink/moist, edentulous  Neuro:  will wake up to light touch, ask to let him rest, MAE CV: rrir  PULM:  non labored, clear anteriorly, bibasilar rales GI: obese, soft, bs+, NT, condom cath, R biliary drain Extremities: warm/dry, L AKA, RLE without edema  Skin: no rashes, posterior not visualized  Resolved  Hospital Problem list    Assessment & Plan:   Acute on chronic hypoxic respiratory failure in the setting of pneumonia Hx emphysema - he has chronic ILD changes on chest CT dating back to 2018 with cystic changes, subpleural reticulation and honeycombing.  Unable to see images, as these were at [facility].  He did pass his SLP today, no dysphagia present, ongoing recs  for dysphagia 3 diet, however do still question if he has some chronic aspiration.  Doubt acute ILD flair or AECOPD, as he is now almost back to his baseline/ home O2 of 2Ls.   Hold on steroids at this time.   Also noted stable RUL consolidation, noted back in July 2022, in which patient refused further workup/ biopsy; AFB was neg at [facility].   Clinical picture most consistent with aspiration +/- CAP.  Noted PCT was negative, but would still empirically treat for 5 days. - agree with ongoing CAP coverage, BD, ongoing pulmonary hygiene - would benefit from palliative care consult - VBG checked, no hypercarbia   Sepsis secondary to pneumonia Hypotension AKI  - has responded well to fluids thus far.  He remains a DNR/ DNI and very poor functional status, we would not recommend any vasopressor support, but just ongoing fluids, abx, etc with supportive care.   Lactate is normal.  CT abd/ pelvis without acute findings, biliary drain stable  - follow culture data   Remainder per primary.  PCCM will sign off and be available as needed.   Best Practice (right click and "Reselect all SmartList Selections" daily)   Diet/type: dysphagia diet (see orders) DVT prophylaxis: DOAC GI prophylaxis: PPI Lines: N/A Foley:  N/A Code Status:  DNR Last date of multidisciplinary goals of care discussion [per primary]  Labs   CBC: Recent Labs  Lab 04/12/2021 1907 04/28/21 0500  WBC 20.2* 19.8*  HGB 12.7* 11.7*  HCT 39.8 36.4*  MCV 96.1 96.6  PLT 696* 519*    Basic Metabolic Panel: Recent Labs  Lab 04/25/2021 1907 04/28/21 0500 04/28/21 0922  NA 138 132*  --   K 4.6 5.5* 5.2*  CL 107 106  --   CO2 21* 18*  --   GLUCOSE 72 158*  --   BUN 62* 53*  --   CREATININE 1.54* 1.31*  --   CALCIUM 11.4* 10.5*  --    GFR: Estimated Creatinine Clearance: 42 mL/min (A) (by C-G formula based on SCr of 1.31 mg/dL (H)). Recent Labs  Lab 04/25/2021 1907 04/28/21 0500 04/28/21 0922  PROCALCITON  --   --  <0.10   WBC 20.2* 19.8*  --   LATICACIDVEN  --  1.3  --     Liver Function Tests: Recent Labs  Lab 04/07/2021 1907  AST 19  ALT 13  ALKPHOS 93  BILITOT 0.5  PROT 7.2  ALBUMIN 2.7*   No results for input(s): LIPASE, AMYLASE in the last 168 hours. Recent Labs  Lab 04/17/2021 2059  AMMONIA 12    ABG    Component Value Date/Time   HCO3 15.7 (L) 04/01/2021 2026   ACIDBASEDEF 8.8 (H) 04/04/2021 2026   O2SAT 76.2 04/13/2021 2026     Coagulation Profile: No results for input(s): INR, PROTIME in the last 168 hours.  Cardiac Enzymes: No results for input(s): CKTOTAL, CKMB, CKMBINDEX, TROPONINI in the last 168 hours.  HbA1C: Hgb A1c MFr Bld  Date/Time Value Ref Range Status  04/02/2021 12:00 PM 7.5 (H) 4.8 - 5.6 % Final  Comment:    (NOTE) Pre diabetes:          5.7%-6.4%  Diabetes:              >6.4%  Glycemic control for   <7.0% adults with diabetes     CBG: Recent Labs  Lab 04/26/2021 1922 04/08/2021 2056 04/28/21 0310  GLUCAP 74 73 140*    Review of Systems:   Unable   Past Medical History:  He,  has a past medical history of Arthritis, Cancer ([place]), Complication of anesthesia, Diabetes mellitus without complication ([place]), GERD (gastroesophageal reflux disease), Hypertension, Myocardial infarction ([place]), and Peripheral vascular disease ([place]).   Surgical History:   Past Surgical History:  Procedure Laterality Date   IR EXCHANGE BILIARY DRAIN  03/31/2021   LEG AMPUTATION ABOVE KNEE Left      Social History:   reports that he quit smoking about 32 years ago. His smoking use included cigarettes. He has never used smokeless tobacco. He reports that he does not currently use alcohol. He reports that he does not currently use drugs.   Family History:  His family history is not on file.   Allergies Allergies  Allergen Reactions   Anesthetics, Amide     Made me crazy and hard to handle.   Doxycycline Shortness Of Breath   Zolpidem     Other reaction(s):  Mental Status Changes (intolerance) "combative"   Amoxicillin-Pot Clavulanate Rash          Home Medications  Prior to Admission medications   Medication Sig Start Date End Date Taking? Authorizing Provider  acetaminophen (TYLENOL) 500 MG tablet Take 500 mg by mouth every 6 (six) hours as needed for moderate pain.   Yes [provider]  apixaban (ELIQUIS) 5 MG TABS tablet Take 1 tablet by mouth in the morning and at bedtime. MD Ordered 1 tablet BID / But according to [facility] , patient seems to only been taking, 1 TABLET DAILY. 03/10/21 06/08/21 Yes [provider]  ascorbic acid (VITAMIN C) 500 MG tablet Take 500 mg by mouth daily.   Yes [provider]  cephALEXin (KEFLEX) 500 MG capsule Take 500 mg by mouth 2 (two) times daily. 04/16/21  Yes [provider]  cetirizine (ZYRTEC) 10 MG tablet Take 1 tablet by mouth daily. 01/20/21  Yes [provider]  Cholecalciferol 25 MCG (1000 UT) tablet Take 1 tablet by mouth daily.   Yes [provider]  ciprofloxacin (CIPRO) 500 MG tablet Take 500 mg by mouth 2 (two) times daily. 04/23/21  Yes [provider]  Fluticasone-Umeclidin-Vilant (TRELEGY ELLIPTA) 100-62.5-25 MCG/INH AEPB Inhale 1 puff into the lungs daily. 04/06/21  Yes Mercy Riding, MD  gabapentin (NEURONTIN) 300 MG capsule Take 100 mg by mouth 2 (two) times daily.   Yes Deloria Lair, NP  glipiZIDE (GLUCOTROL) 5 MG tablet Take 5 mg by mouth 2 (two) times daily. 09/02/14  Yes [provider]  HUMALOG KWIKPEN 100 UNIT/ML KwikPen Inject 3-15 Units into the skin See admin instructions. Sliding Scale  blood sugar 101-150 = 3units, 151-200= 4units, 201-250 = 7units, 251-300 = 9units, 301-350 = 12units, >350 = 15units 04/22/21  Yes [provider]  ipratropium-albuterol (DUONEB) 0.5-2.5 (3) MG/3ML SOLN Take 3 mLs by nebulization every 6 (six) hours as needed (Shortness of breath, cough and/or wheeze). 04/06/21  Yes Mercy Riding, MD   memantine (NAMENDA) 5 MG tablet Take 5 mg by mouth 2 (two) times daily. 03/10/21  Yes [provider]  mirtazapine (REMERON) 15 MG tablet Take 15 mg by mouth at bedtime. 04/22/21  Yes [provider]  Multiple Vitamins-Minerals (MULTIVITAMIN WITH MINERALS) tablet Take 1 tablet by mouth daily.   Yes [provider]  mupirocin ointment (BACTROBAN) 2 % Apply 1 application topically 2 (two) times daily. 10/11/20  Yes [provider]  oxyCODONE-acetaminophen (PERCOCET/ROXICET) 5-325 MG tablet Take 1 tablet by mouth 4 (four) times daily as needed. 04/12/21  Yes [provider]  pantoprazole sodium (PROTONIX) 40 mg/20 mL SUSP Take 40 mg by mouth daily.   Yes [provider]  pioglitazone (ACTOS) 30 MG tablet  08/25/14  Yes [provider]  PRESCRIPTION MEDICATION Take 120 mLs by mouth 2 (two) times daily. MED PASS Supplement, may substitute equal amounts of Health Shake if Med Pass isn't available   Yes [provider]  simvastatin (ZOCOR) 20 MG tablet Take 20 mg by mouth at bedtime. 09/13/14  Yes [provider]  tamsulosin (FLOMAX) 0.4 MG CAPS capsule Take 0.4 mg by mouth daily. 03/27/21  Yes [provider]  vitamin B-12 (CYANOCOBALAMIN) 250 MCG tablet Take 250 mcg by mouth daily.   Yes [provider]     Critical care time: n/a     Kennieth Rad, ACNP [facility] 04/28/2021, 7:10 PM  See Amion for pager If no response to pager, please call PCCM consult pager After 7:00 pm call Elink

## 2021-04-28 NOTE — Progress Notes (Signed)
PROGRESS NOTE    William Buchanan  BSJ:[id number] DOB: 1934-01-08 DOA: 04/26/2021 PCP: Leota Jacobsen, MD   Chief Complaint  Patient presents with   Altered Mental Status    Brief Narrative:  Patient 87 year old gentleman history of COPD with chronic hypoxemia on 2-1/2 L nasal cannula, interstitial lung disease, type 2 diabetes, recent PE on Eliquis, PVD status post left AKA, CVA, CAD status post stenting, hyperlipidemia, recent gangrenous cholecystitis status post biliary drain presented to the SNF due to shortness of breath and hypoxia.  On route via EMS patient required 6 L and received 15 L nonrebreather in the ED.  Patient noted to have a leukocytosis white count of 20, TSH obtained, BNP 118, troponin of 9.  Chest x-ray done with patchy right upper lobe consolidation which could represent pneumonia.  D-dimer elevated CT angiogram chest showed resolution of previous PE seen in July with interval development of left lower lobe.  Bronchovascular groundglass opacities suggestive of possible inflammation versus infection. -Patient placed on IV vancomycin, IV cefepime.  Patient noted to be hypothermic overnight and into this morning.   Assessment & Plan:   Principal Problem:   Acute on chronic respiratory failure with hypoxia (HCC) Active Problems:   Essential hypertension   Type 2 diabetes mellitus with diabetic polyneuropathy, without long-term current use of insulin (HCC)   S/P AKA (above knee amputation) unilateral, left (HCC)   History of cholecystitis   AKI (acute kidney injury) ([place])   PVD (peripheral vascular disease) (HCC)   CAD (coronary artery disease)   Pressure injury of skin  #1 acute on chronic hypoxemic respiratory failure likely secondary to pneumonia, POA -Patient noted to have a baseline O2 requirements of 2.5 L however required 15 L on nonrebreather on presentation to the ED. -Noted to have difficult to obtain peripheral pulse ox as patient with severe PAD. -VBG  done was reassuring with normal pH, no hypercarbia, patient able to speak in full sentences with no use of accessory muscles of respiration. -Sputum gram stain and cultures pending. -Urine Legionella pending, urine strep pneumococcus pending. -Patient with ongoing hypothermia and as such we will check blood cultures x2. -CT chest findings with left lower lobe groundglass opacities noted concerning for infection versus inflammation. -Discontinue vancomycin. -Continue IV cefepime. -Start IV azithromycin. -Placed on Pulmicort, Flonase, scheduled duo nebs, PPI. -Consult with PCCM for further evaluation and management.  2.  Hypothermia -??  Etiology. -May be secondary to acute infectious process secondary to problem #1. -Check UA with cultures and sensitivities, check blood cultures x2, check a procalcitonin level. -Urine Legionella antigen pending, urine strep pneumococcus antigen pending. -TSH noted at 10.568.  Free T4 within normal limits at 0.94. -Abnormal TSH likely secondary to euthyroid sick syndrome and will need repeat labs done in about 4 to 6 weeks in the outpatient setting. -Placed on a Bair hugger. -PCCM consult.  3.  Recent gangrenous cholecystitis status post biliary drain -Patient noted to have been hospitalized from 7/26-8/10 for severe sepsis secondary to gangrenous cholecystitis status post percutaneous drain placement. -Patient noted to have drain dislodged and was readmitted with sepsis due to possible peritonitis/cholecystitis with successful placement of biliary drain 03/31/2021. -Patient with some right upper quadrant abdominal tenderness to palpation. -Percutaneous drain with biliary drainage noted. -Right upper quadrant ultrasound with no acute findings, percutaneous biliary drain which is normally located by preceding CT. -CT abdomen and pelvis with no acute findings or abscess noted.  Stable biliary drain positioning. -Outpatient follow-up with IR.  4.  Acute  kidney injury -Likely secondary to prerenal azotemia. -IV fluids.  Supportive care.  5.  Abnormal TSH -TSH of 10.568. -Free T4 within normal limits at 0.94. -Likely euthyroid sick syndrome which was noted in April at [facility]. -Repeat thyroid function studies in about 4 to 6 weeks.  6.  PVD -Status post AKA, second toe amputation on the right. -Risk factor modification. -Continue Eliquis, statin.  7.  History of recent PE -Eliquis.  8.  Coronary artery disease status post stent placement -Continue statin, Eliquis.  9.  Type 2 diabetes mellitus -Hemoglobin A1c 7.5 (04/02/2021).  9.  Pressure injury, POA Pressure Injury 04/28/21 Buttocks Right Stage 2 -  Partial thickness loss of dermis presenting as a shallow open injury with a red, pink wound bed without slough. (Active)  04/28/21 0200  Location: Buttocks  Location Orientation: Right  Staging: Stage 2 -  Partial thickness loss of dermis presenting as a shallow open injury with a red, pink wound bed without slough.  Wound Description (Comments):   Present on Admission: Yes         DVT prophylaxis: Eliquis Code Status: DNR Family Communication: Updated patient.  No family at bedside. Disposition:   Status is: Inpatient  Remains inpatient appropriate because:Inpatient level of care appropriate due to severity of illness  Dispo: The patient is from: SNF              Anticipated d/c is to: SNF              Patient currently is not medically stable to d/c.   Difficult to place patient No       Consultants:  PCCM pending  Procedures:  CT head 04/03/2021 CT angiogram chest 04/19/2021 CT abdomen and pelvis 04/28/2021 Chest x-ray 04/09/2021 Abdominal ultrasound 04/28/2021   Antimicrobials:  IV azithromycin 04/28/2021>>>> IV cefepime 04/24/2021>>>>>. IV vancomycin 04/18/2021>>>> 04/28/2021   Subjective: Sleeping but arousable. Slurred speech. Patient denies any CP, No SOB, no abd pain. C/O  feelibng cold.  Objective: Vitals:   04/28/21 0607 04/28/21 0613 04/28/21 0937 04/28/21 0940  BP:  100/80 115/69   Pulse:  74 85   Resp:  20 20   Temp: 99.1 F (37.3 C) (!) 97.2 F (36.2 C)  (!) 95.9 F (35.5 C)  TempSrc: Axillary Oral  Rectal  SpO2:  98% 98%   Weight:      Height:        Intake/Output Summary (Last 24 hours) at 04/28/2021 1018 Last data filed at 04/28/2021 0600 Gross per 24 hour  Intake 1074.02 ml  Output 515 ml  Net 559.02 ml   Filed Weights   04/02/2021 1906  Weight: 72 kg    Examination:  General exam: Appears calm and comfortable  Respiratory system: BIbasilar coarse BS. No wheezing.  No crackles. Cardiovascular system: S1 & S2 heard, RRR. No JVD, murmurs, rubs, gallops or clicks. No pedal edema. Gastrointestinal system: RUQ TTP.  Abdomen is soft, nondistended, positive bowel sounds.  No rebound.  No guarding.  Drain in place with biliary drainage noted. Central nervous system: Alert and oriented.  Some slurred speech.  No focal neurological deficits. Extremities: s/p L BKA. Skin: No rashes, lesions or ulcers Psychiatry: Judgement and insight appear fair. Mood & affect appropriate.     Data Reviewed: I have personally reviewed following labs and imaging studies  CBC: Recent Labs  Lab 04/13/2021 1907 04/28/21 0500  WBC 20.2* 19.8*  HGB 12.7* 11.7*  HCT  39.8 36.4*  MCV 96.1 96.6  PLT 696* 519*    Basic Metabolic Panel: Recent Labs  Lab 04/14/2021 1907 04/28/21 0500 04/28/21 0922  NA 138 132*  --   K 4.6 5.5* 5.2*  CL 107 106  --   CO2 21* 18*  --   GLUCOSE 72 158*  --   BUN 62* 53*  --   CREATININE 1.54* 1.31*  --   CALCIUM 11.4* 10.5*  --     GFR: Estimated Creatinine Clearance: 42 mL/min (A) (by C-G formula based on SCr of 1.31 mg/dL (H)).  Liver Function Tests: Recent Labs  Lab 04/14/2021 1907  AST 19  ALT 13  ALKPHOS 93  BILITOT 0.5  PROT 7.2  ALBUMIN 2.7*    CBG: Recent Labs  Lab 04/26/2021 1922 04/30/2021 2056  04/28/21 0310  GLUCAP 74 73 140*     Recent Results (from the past 240 hour(s))  Resp Panel by RT-PCR (Flu A&B, Covid) Nasopharyngeal Swab     Status: None   Collection Time: 04/14/2021  8:24 PM   Specimen: Nasopharyngeal Swab; Nasopharyngeal(NP) swabs in vial transport medium  Result Value Ref Range Status   SARS Coronavirus 2 by RT PCR NEGATIVE NEGATIVE Final    Comment: (NOTE) SARS-CoV-2 target nucleic acids are NOT DETECTED.  The SARS-CoV-2 RNA is generally detectable in upper respiratory specimens during the acute phase of infection. The lowest concentration of SARS-CoV-2 viral copies this assay can detect is 138 copies/mL. A negative result does not preclude SARS-Cov-2 infection and should not be used as the sole basis for treatment or other patient management decisions. A negative result may occur with  improper specimen collection/handling, submission of specimen other than nasopharyngeal swab, presence of viral mutation(s) within the areas targeted by this assay, and inadequate number of viral copies(<138 copies/mL). A negative result must be combined with clinical observations, patient history, and epidemiological information. The expected result is Negative.  Fact Sheet for Patients:  EntrepreneurPulse.com.au  Fact Sheet for Healthcare Providers:  IncredibleEmployment.be  This test is no t yet approved or cleared by the [place] FDA and  has been authorized for detection and/or diagnosis of SARS-CoV-2 by FDA under an Emergency Use Authorization (EUA). This EUA will remain  in effect (meaning this test can be used) for the duration of the COVID-19 declaration under Section 564(b)(1) of the Act, 21 U.S.C.section 360bbb-3(b)(1), unless the authorization is terminated  or revoked sooner.       Influenza A by PCR NEGATIVE NEGATIVE Final   Influenza B by PCR NEGATIVE NEGATIVE Final    Comment: (NOTE) The Xpert Xpress  SARS-CoV-2/FLU/RSV plus assay is intended as an aid in the diagnosis of influenza from Nasopharyngeal swab specimens and should not be used as a sole basis for treatment. Nasal washings and aspirates are unacceptable for Xpert Xpress SARS-CoV-2/FLU/RSV testing.  Fact Sheet for Patients: EntrepreneurPulse.com.au  Fact Sheet for Healthcare Providers: IncredibleEmployment.be  This test is not yet approved or cleared by the [place] FDA and has been authorized for detection and/or diagnosis of SARS-CoV-2 by FDA under an Emergency Use Authorization (EUA). This EUA will remain in effect (meaning this test can be used) for the duration of the COVID-19 declaration under Section 564(b)(1) of the Act, 21 U.S.C. section 360bbb-3(b)(1), unless the authorization is terminated or revoked.  Performed at [facility], [place] [address] [place], [place] [address]   MRSA Next Gen by PCR, Nasal     Status: None  Collection Time: 04/28/21 12:29 AM   Specimen: Nasal Mucosa; Nasal Swab  Result Value Ref Range Status   MRSA by PCR Next Gen NOT DETECTED NOT DETECTED Final    Comment: (NOTE) The GeneXpert MRSA Assay (FDA approved for NASAL specimens only), is one component of a comprehensive MRSA colonization surveillance program. It is not intended to diagnose MRSA infection nor to guide or monitor treatment for MRSA infections. Test performance is not FDA approved in patients less than 78 years old. Performed at [facility], [place] [address] [place], [place] [address]          Radiology Studies: CT HEAD WO CONTRAST (5MM)  Result Date: 04/30/2021 CLINICAL DATA:  Encephalopathy EXAM: CT HEAD WITHOUT CONTRAST TECHNIQUE: Contiguous axial images were obtained from the base of the skull through the vertex without intravenous contrast. COMPARISON:  02/19/2021 FINDINGS: Brain: There is no mass, hemorrhage or extra-axial  collection. There is generalized volume loss. There is hypoattenuation of the white matter, most commonly indicating chronic small vessel disease. Old right frontal and bilateral cerebellar infarcts. Vascular: No abnormal hyperdensity of the major intracranial arteries or dural venous sinuses. No intracranial atherosclerosis. Skull: The visualized skull base, calvarium and extracranial soft tissues are normal. Sinuses/Orbits: No fluid levels or advanced mucosal thickening of the visualized paranasal sinuses. No mastoid or middle ear effusion. The orbits are normal. IMPRESSION: 1. No acute intracranial abnormality. 2. Old right frontal and bilateral cerebellar infarcts and findings of chronic small vessel disease. Electronically Signed   By: Ulyses Jarred M.D.   On: 04/04/2021 21:09   CT Angio Chest PE W and/or Wo Contrast  Result Date: 04/12/2021 CLINICAL DATA:  PE suspected, low/intermediate prob, positive D-dimer. SOB and AMS EXAM: CT ANGIOGRAPHY CHEST WITH CONTRAST TECHNIQUE: Multidetector CT imaging of the chest was performed using the standard protocol during bolus administration of intravenous contrast. Multiplanar CT image reconstructions and MIPs were obtained to evaluate the vascular anatomy. CONTRAST:  1mL OMNIPAQUE IOHEXOL 350 MG/ML SOLN COMPARISON:  Chest x-ray 04/28/2021, CT angio chest 02/18/2021, CT chest 11/26/2020, CT angiography chest 12/26/2016 FINDINGS: Cardiovascular: Satisfactory opacification of the pulmonary arteries to the segmental level. No evidence of pulmonary embolism. Interval resolution of previously identified right lower lobe subsegmental pulmonary embolus on CT angio chest 02/18/2021. The main pulmonary artery is normal in caliber. Normal heart size. No pericardial effusion. Severe atherosclerotic plaque of the aorta. Four-vessel coronary artery calcifications. Mediastinum/Nodes: No enlarged mediastinal, hilar, or axillary lymph nodes. Thyroid gland, trachea, and esophagus  demonstrate no significant findings. Lungs/Pleura: Redemonstration of peripheral cystic changes and reticulations. At least moderate centrilobular emphysematous changes. Stable 2.3 x 2.4 cm subpleural right upper lobe consolidation with cystic changes. Interval development of right lower lobe peribronchovascular subsolid nodular-like densities. Upper Abdomen: Colonic diverticulosis. Transhepatic tube partially visualized status post cholecystectomy no acute abnormality. Musculoskeletal: No chest wall abnormality. No suspicious lytic or blastic osseous lesions. No acute displaced fracture. Multilevel degenerative changes of the spine. Review of the MIP images confirms the above findings. IMPRESSION: 1. No pulmonary embolus. 2. Interval development of left lower lobe peribronchovascular ground-glass airspace opacities. Findings suggestive of infection/inflammation. Non-contrast chest CT at 3-6 months is recommended. If nodules persist, subsequent management will be based upon the most suspicious nodule(s). This recommendation follows the consensus statement: Guidelines for Management of Incidental Pulmonary Nodules Detected on CT Images: From the Fleischner Society 2017; Radiology 2017; 284:228-243. 3. Stable 2.3 x 2.4 cm subpleural right upper lobe consolidation with cystic changes. Finding may represent  scarring versus cavitary lesion versus atypical infection. 4. Aortic Atherosclerosis (ICD10-I70.0) and Emphysema (ICD10-J43.9). 5. Electronically Signed   By: Iven Finn M.D.   On: 04/30/2021 22:26   CT ABDOMEN PELVIS W CONTRAST  Result Date: 04/28/2021 CLINICAL DATA:  Abdominal abscess or infection suspected. Right abdominal pain. Percutaneous biliary drainage. EXAM: CT ABDOMEN AND PELVIS WITH CONTRAST TECHNIQUE: Multidetector CT imaging of the abdomen and pelvis was performed using the standard protocol following bolus administration of intravenous contrast. CONTRAST:  77mL OMNIPAQUE IOHEXOL 350 MG/ML SOLN  COMPARISON:  04/02/2021 FINDINGS: Lower chest: Chest CTA performed few hours earlier. Improved aeration at the right base where there is diminished pleural fluid and better aerated lung. Subpleural pulmonary fibrosis. Atheromatous and cardiac calcification. Hepatobiliary: No focal liver abnormality.Cholecystectomy. Percutaneous biliary drain in stable position, reaching the duodenum. Chronic mild dilatation of intrahepatic ducts in the upper right lobe liver which may be related to catheter. Pancreas: No acute finding. Diffuse atrophy and chronic ductal dilatation towards the midline. Spleen: Unremarkable. Adrenals/Urinary Tract: Negative adrenals. No hydronephrosis or obstructing stone. Limited for detecting calculi given contrast excretion at time of imaging. Bilateral renal cortical thinning/scarring. Unremarkable bladder. Stomach/Bowel: No obstruction. No evidence of acute inflammation. Left colonic diverticulosis. Vascular/Lymphatic: No acute vascular abnormality. Severe atheromatous calcification of the aorta and branch vessels. No mass or adenopathy. Reproductive:Unremarkable for age Other: No ascites or pneumoperitoneum. High-density nodule at the inferior right liver tip which may reflect gallstone as previously suggested. No convincing adjacent inflammation. Musculoskeletal: No acute abnormalities. Osteopenia and generalized spinal degeneration with mild scoliosis. L5 chronic pars defects with L5-S1 anterolisthesis and severe foraminal impingement. IMPRESSION: 1. No acute finding or abscess.  Stable biliary drain positioning. 2. Numerous chronic findings are described above. Electronically Signed   By: Jorje Guild M.D.   On: 04/28/2021 05:11   DG Chest Portable 1 View  Result Date: 04/07/2021 CLINICAL DATA:  Tachypnea, short of breath EXAM: PORTABLE CHEST 1 VIEW COMPARISON:  02/18/2021 FINDINGS: Single frontal view of the chest demonstrates a stable cardiac silhouette. There is persistent patchy  consolidation within the right upper lobe. Chronic background interstitial scarring throughout the lungs, greatest at the bases. No effusion or pneumothorax. No acute bony abnormalities. IMPRESSION: 1. Persistent patchy right upper lobe consolidation, which may reflect pneumonia. 2. Chronic background scarring and fibrosis. Electronically Signed   By: Randa Ngo M.D.   On: 04/20/2021 20:34   US Abdomen Limited RUQ (LIVER/GB)  Result Date: 04/28/2021 CLINICAL DATA:  Recent gangrenous cholecystitis with percutaneous cholecystostomy in biliary drainage. Increased sanguinous material within the biliary drain back. EXAM: ULTRASOUND ABDOMEN LIMITED RIGHT UPPER QUADRANT COMPARISON:  Preceding abdominal CT FINDINGS: Gallbladder: History of cholecystectomy Common bile duct: Dilated at 13 mm, of doubtful significance given normal biliary labs. There is a traversing catheter which is better seen by CT. No evidence of internal hemorrhage or stone. Liver: The biliary drain is seen traversing the liver parenchyma. No evidence of mass or collection. Portal vein is patent on color Doppler imaging with normal direction of blood flow towards the liver. IMPRESSION: 1. No acute finding. 2. Percutaneous biliary drain which is normally located by preceding CT. Electronically Signed   By: Jorje Guild M.D.   On: 04/28/2021 06:16        Scheduled Meds:  apixaban  5 mg Oral BID   ascorbic acid  500 mg Oral Daily   budesonide (PULMICORT) nebulizer solution  0.5 mg Nebulization BID   cholecalciferol  1,000 Units Oral Daily  fluticasone  2 spray Each Nare Daily   gabapentin  100 mg Oral BID   ipratropium-albuterol  3 mL Nebulization Q6H   loratadine  10 mg Oral Daily   mouth rinse  15 mL Mouth Rinse BID   memantine  5 mg Oral BID   mirtazapine  15 mg Oral QHS   pantoprazole sodium  40 mg Oral Daily   simvastatin  20 mg Oral q1800   tamsulosin  0.4 mg Oral Daily   vitamin B-12  250 mcg Oral Daily   Continuous  Infusions:  sodium chloride 100 mL/hr at 04/28/21 3462   azithromycin 500 mg (04/28/21 0920)   ceFEPime (MAXIPIME) IV     [START ON 04/29/2021] vancomycin       LOS: 1 day    Time spent: 40 minutes    Irine Seal, MD [facility]   To contact the attending provider between 7A-7P or the covering provider during after hours 7P-7A, please log into the web site [email or web address] and access using universal [place] password for that web site. If you do not have the password, please call the hospital operator.  04/28/2021, 10:18 AM

## 2021-04-28 NOTE — Progress Notes (Signed)
    OVERNIGHT PROGRESS REPORT  Notified by RR RN of floor RN concern over suture of Right side drain. Patient was initially reported to RR RN as difficult to wake but was found awake and vocal by RR RN.  His Temperature was found to be low but is being warmed with warm blankets and environmental alteration. He is awake and verbal.  His Right sided drain has remnants of a suture attached to the drain. He is not complaining of pain in this area  Due to previous dislodgement of same, imaging has been ordered along with a IV fluid bolus for borderline BP with warming started.    Gershon Cull MSNA MSN ACNPC-AG Acute Care Nurse Practitioner [place]

## 2021-04-28 NOTE — TOC Initial Note (Signed)
Transition of Care [facility]) - Initial/Assessment Note    Patient Details  Name: William Buchanan MRN: [id number] Date of Birth: [birth year]  [facility] of [facility]) CM/SW Contact:    Dessa Phi, RN Phone Number: 04/28/2021, 12:25 PM  Clinical Narrative: From [facility]-LTC per rep Birdena Jubilee to dtr Teresa-concerns about returning back-she wants to pursue another LTC facility-she has not filed medicaid paperwork but has it in hand-informed her that DSS is where she would file for medicaid-voiced understanding,also informed her its not easy to get into a new LTC facility.Continue to monitor.                 Expected Discharge Plan: Long Term Nursing Home Barriers to Discharge: Continued Medical Work up   Patient Goals and CMS Choice Patient states their goals for this hospitalization and ongoing recovery are:: go to rehab CMS Medicare.gov Compare Post Acute Care list provided to:: Patient Represenative (must comment) (dtr Teresa [phone]) Choice offered to / list presented to : Adult Children  Expected Discharge Plan and Services Expected Discharge Plan: [place]   Discharge Planning Services: CM Consult Post Acute Care Choice: Nursing Home Living arrangements for the past 2 months: [place] (LTC) Expected Discharge Date:  (unknown)                                    Prior Living Arrangements/Services Living arrangements for the past 2 months: [place] (LTC) Lives with:: Facility Resident Patient language and need for interpreter reviewed:: Yes Do you feel safe going back to the place where you live?: Yes      Need for Family Participation in Patient Care: No (Comment) Care giver support system in place?: Yes (comment)   Criminal Activity/Legal Involvement Pertinent to Current Situation/Hospitalization: No - Comment as needed  Activities of Daily Living Home Assistive Devices/Equipment: Blood pressure cuff, CBG Meter, Grab  bars around toilet, Grab bars in shower, Hand-held shower hose, Hospital bed, Scales, Wheelchair ADL Screening (condition at time of admission) Patient's cognitive ability adequate to safely complete daily activities?: No Is the patient deaf or have difficulty hearing?: No Does the patient have difficulty seeing, even when wearing glasses/contacts?: 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) Does the patient have difficulty concentrating, remembering, or making decisions?: Yes Patient able to express need for assistance with ADLs?: Yes Does the patient have difficulty dressing or bathing?: Yes Independently performs ADLs?: No Communication: Independent Dressing (OT): Needs assistance Is this a change from baseline?: Pre-admission baseline Grooming: Independent Feeding: Independent Bathing: Needs assistance Is this a change from baseline?: Pre-admission baseline Toileting: Needs assistance Is this a change from baseline?: Pre-admission baseline In/Out Bed: Needs assistance Is this a change from baseline?: Pre-admission baseline Walks in Home: Dependent (patient wheelchair bound) Is this a change from baseline?: Pre-admission baseline Does the patient have difficulty walking or climbing stairs?: Yes (secondary to weakness and right AKA) Weakness of Legs: Left Weakness of Arms/Hands: None  Permission Sought/Granted Permission sought to share information with : Case Manager Permission granted to share information with : Yes, Verbal Permission Granted  Share Information with NAME: Case manager     Permission granted to share info w Relationship: [name] dtr [phone]     Emotional Assessment Appearance:: Appears stated age Attitude/Demeanor/Rapport: Gracious Affect (typically observed): Accepting Orientation: : Oriented to Self, Oriented to Place, Oriented to  Time, Oriented to Situation Alcohol / Substance  Use: Not Applicable Psych Involvement: No (comment)  Admission diagnosis:  Surgery  follow-up [Z09] Acute on chronic respiratory failure with hypoxia (HCC) [J96.21] Community acquired pneumonia, unspecified laterality [J18.9] Patient Active Problem List   Diagnosis Date Noted   History of cholecystitis 04/28/2021   AKI (acute kidney injury) ([place]) 04/28/2021   PVD (peripheral vascular disease) ([place]) 04/28/2021   CAD (coronary artery disease) 04/28/2021   Pressure injury of skin 04/28/2021   Hypothermia 04/28/2021   Acute on chronic respiratory failure with hypoxia ([place]) 04/19/2021   Pulmonary embolism ([place]) 04/02/2021   Abdominal pain 03/30/2021   Migration of percutaneous transhepatic biliary drain catheter 03/30/2021   S/P AKA (above knee amputation) unilateral, left ([place]) 03/30/2021   Fall from wheelchair 03/30/2021   Leukocytosis 03/30/2021   COPD (chronic obstructive pulmonary disease) ([place]) 03/30/2021   Hyperkalemia 03/30/2021   Critical lower limb ischemia ([place]) 06/07/2017   Severe protein-calorie malnutrition ([place]) 06/07/2017   Diverticulitis 02/18/2017   Arcus senilis, bilateral 01/10/2017   History of non-ST elevation myocardial infarction (NSTEMI) 12/26/2016   Bilateral carotid artery stenosis 08/25/2016   Benign neoplasm of colon 07/07/2015   Inflammatory polyarthritis ([place]) 07/07/2015   Type 2 diabetes mellitus with diabetic polyneuropathy, without long-term current use of insulin ([place]) 07/07/2015   Carotid atherosclerosis 07/07/2015   Local infection of skin and subcutaneous tissue 09/15/2014   Bunion 09/15/2014   Ulcer of heel and midfoot ([place]) 09/15/2014   Essential hypertension 04/16/2014   PCP:  Leota Jacobsen, MD Pharmacy:   [facility] [phone] - [place] - [address] AT [place] [address] Phone: [phone] Fax: [phone]     Social Determinants of Health (SDOH) Interventions    Readmission Risk Interventions No flowsheet data found.

## 2021-04-28 NOTE — Progress Notes (Signed)
   04/28/21 0130  Assess: MEWS Score  Temp (!) 97.4 F (36.3 C)  BP 98/66  Pulse Rate 80  Resp (!) 26  Level of Consciousness Alert  SpO2 93 %  O2 Device Nasal Cannula  O2 Flow Rate (L/min) 5 L/min  Assess: MEWS Score  MEWS Temp 0  MEWS Systolic 1  MEWS Pulse 0  MEWS RR 2  MEWS LOC 0  MEWS Score 3  MEWS Score Color Yellow  Assess: if the MEWS score is Yellow or Red  Were vital signs taken at a resting state? Yes  Focused Assessment No change from prior assessment  Does the patient meet 2 or more of the SIRS criteria? Yes  Provider and Rapid Response Notified? Yes  MEWS guidelines implemented *See Row Information* Yes  Notify: Charge Nurse/RN  Name of Charge Nurse/RN Notified Olin Hauser, RN  Date Charge Nurse/RN Notified 04/28/21  Time Charge Nurse/RN Notified 0200  Notify: Rapid Response  Name of Rapid Response RN Notified Genell, RN  Date Rapid Response Notified 04/28/21  Time Rapid Response Notified 0210  Assess: SIRS CRITERIA  SIRS Temperature  0  SIRS Pulse 0  SIRS Respirations  1  SIRS WBC 1  SIRS Score Sum  2  Received pt on stretcher from ED accompanied by ER nurse, pt alert to self and place, able to state his name and birthdate and swallow pills whole. Warm blankets and heat packs are applied to the pt.

## 2021-04-28 NOTE — Progress Notes (Signed)
Initial Nutrition Assessment  DOCUMENTATION CODES:   Severe malnutrition in context of chronic illness  INTERVENTION:  - will order Boost Breeze once/day, each supplement provides 250 kcal and 9 grams of protein. - will order Ensure Plus once/day, each supplement provides 350 kcal and 20 grams of protein. - will order Magic Cup BID with meals, each supplement provides 290 kcal and 9 grams of protein. - will order 30 ml Prosource Plus once/day, each supplement provides 100 kcal and 15 grams protein.  - will order 1 tablet multivitamin with minerals/day.    NUTRITION DIAGNOSIS:   Severe Malnutrition related to chronic illness as evidenced by severe fat depletion, severe muscle depletion.  GOAL:   Patient will meet greater than or equal to 90% of their needs  MONITOR:   PO intake, Supplement acceptance, Labs, Weight trends  REASON FOR ASSESSMENT:   Low Braden  ASSESSMENT:   87 y.o. male with medical history of COPD with chronic hypoxemia on 2.5 L, interstitial lung disease, type 2 DM, recent PE on Eliquis, PVD s/p left AKA, CVA, CAD s/p stent, HLD, and recent gangrenous cholecystitis s/p biliary drain. He presented to the ED due to increased shortness of breath, hypoxia, and all over pain. CXR showed patchy RUL consolidation.  Patient seen while he was still on 4E, prior to being transferred to 2W. He was transferred in order to be placed on bair hugger.  Patient was sleeping and somewhat difficult to arouse at that time. Once he was awake he mainly kept eyes closed and complained of being cold. He was unable to provide any information and there were was no family or visitors at bedside.   Weight yesterday was 159 lb and weight has been stable x3 years with most recently documented weight PTA being 160 lb on 04/17/18.   Palliative Care is following.   Per H&P: - acute on chronic hypoxemic respiratory failure 2/2 PNA - sepsis 2/2 PNA - recent gangrenous cholecystitis s/p  biliary drain - AKI     Labs reviewed; CBG: 140 mg/dl, Na: 132 mmol/l, K: 5.2 mmol/l, BUN: 53 mg/dl, creatinine: 1.31 mg/dl, Ca: 10.5 mg/dl, GFR: 54 ml/hr.   Medications reviewed; 1000 units cholecalciferol/day, 40 mg oral protonix/day, 250 mcg oral cyanocobalamin/day.   IVF; NS @ 100 ml/hr.     NUTRITION - FOCUSED PHYSICAL EXAM:  Flowsheet Row Most Recent Value  Orbital Region Moderate depletion  Upper Arm Region Severe depletion  Thoracic and Lumbar Region Unable to assess  Buccal Region Severe depletion  Temple Region Moderate depletion  Clavicle Bone Region Severe depletion  Clavicle and Acromion Bone Region Severe depletion  Scapular Bone Region Moderate depletion  Dorsal Hand Moderate depletion  Patellar Region Moderate depletion  Anterior Thigh Region Moderate depletion  Posterior Calf Region Moderate depletion  Edema (RD Assessment) None  Hair Reviewed  Eyes Unable to assess  Mouth Unable to assess  Skin Reviewed  Nails Reviewed       Diet Order:   Diet Order             DIET DYS 3 Room service appropriate? Yes; Fluid consistency: Thin  Diet effective now                   EDUCATION NEEDS:   Not appropriate for education at this time  Skin:  Skin Assessment: Skin Integrity Issues: Skin Integrity Issues:: Stage II, Other (Comment) Stage II: R buttocks Other: IAD to bilteral sacrum  Last BM:  PTA/unknown  Height:  Ht Readings from Last 1 Encounters:  04/11/2021 5\' 9"  (1.753 m)    Weight:   Wt Readings from Last 1 Encounters:  04/24/2021 72 kg    Estimated Nutritional Needs:  Kcal:  1900-2100 kcal Protein:  95-110 grams Fluid:  >/= 2.2 L/day     Jarome Matin, MS, RD, LDN, CNSC Inpatient Clinical Dietitian RD pager # available in [facility]  After hours/weekend pager # available in [facility]

## 2021-04-29 DIAGNOSIS — J189 Pneumonia, unspecified organism: Principal | ICD-10-CM

## 2021-04-29 DIAGNOSIS — N179 Acute kidney failure, unspecified: Secondary | ICD-10-CM | POA: Diagnosis not present

## 2021-04-29 DIAGNOSIS — Z7189 Other specified counseling: Secondary | ICD-10-CM | POA: Diagnosis not present

## 2021-04-29 DIAGNOSIS — I2510 Atherosclerotic heart disease of native coronary artery without angina pectoris: Secondary | ICD-10-CM | POA: Diagnosis not present

## 2021-04-29 DIAGNOSIS — I499 Cardiac arrhythmia, unspecified: Secondary | ICD-10-CM

## 2021-04-29 DIAGNOSIS — J9621 Acute and chronic respiratory failure with hypoxia: Secondary | ICD-10-CM | POA: Diagnosis not present

## 2021-04-29 LAB — MAGNESIUM: Magnesium: 1.8 mg/dL (ref 1.7–2.4)

## 2021-04-29 LAB — CBC WITH DIFFERENTIAL/PLATELET
Abs Immature Granulocytes: 0.45 10*3/uL — ABNORMAL HIGH (ref 0.00–0.07)
Basophils Absolute: 0.1 10*3/uL (ref 0.0–0.1)
Basophils Relative: 1 %
Eosinophils Absolute: 0.2 10*3/uL (ref 0.0–0.5)
Eosinophils Relative: 2 %
HCT: 31.6 % — ABNORMAL LOW (ref 39.0–52.0)
Hemoglobin: 10.1 g/dL — ABNORMAL LOW (ref 13.0–17.0)
Immature Granulocytes: 3 %
Lymphocytes Relative: 13 %
Lymphs Abs: 1.7 10*3/uL (ref 0.7–4.0)
MCH: 31.4 pg (ref 26.0–34.0)
MCHC: 32 g/dL (ref 30.0–36.0)
MCV: 98.1 fL (ref 80.0–100.0)
Monocytes Absolute: 0.8 10*3/uL (ref 0.1–1.0)
Monocytes Relative: 6 %
Neutro Abs: 9.9 10*3/uL — ABNORMAL HIGH (ref 1.7–7.7)
Neutrophils Relative %: 75 %
Platelets: 450 10*3/uL — ABNORMAL HIGH (ref 150–400)
RBC: 3.22 MIL/uL — ABNORMAL LOW (ref 4.22–5.81)
RDW: 18.1 % — ABNORMAL HIGH (ref 11.5–15.5)
WBC: 13.1 10*3/uL — ABNORMAL HIGH (ref 4.0–10.5)
nRBC: 0 % (ref 0.0–0.2)

## 2021-04-29 LAB — BASIC METABOLIC PANEL
Anion gap: 4 — ABNORMAL LOW (ref 5–15)
BUN: 47 mg/dL — ABNORMAL HIGH (ref 8–23)
CO2: 19 mmol/L — ABNORMAL LOW (ref 22–32)
Calcium: 9.3 mg/dL (ref 8.9–10.3)
Chloride: 110 mmol/L (ref 98–111)
Creatinine, Ser: 1.18 mg/dL (ref 0.61–1.24)
GFR, Estimated: >60 mL/min (ref >60)
Glucose, Bld: 457 mg/dL — ABNORMAL HIGH (ref 70–99)
Potassium: 4.5 mmol/L (ref 3.5–5.1)
Sodium: 133 mmol/L — ABNORMAL LOW (ref 135–145)

## 2021-04-29 LAB — URINE CULTURE: Culture: NO GROWTH

## 2021-04-29 LAB — PROCALCITONIN: Procalcitonin: <0.1 ng/mL

## 2021-04-29 LAB — STREP PNEUMONIAE URINARY ANTIGEN: Strep Pneumo Urinary Antigen: NEGATIVE

## 2021-04-29 LAB — GLUCOSE, CAPILLARY
Glucose-Capillary: 175 mg/dL — ABNORMAL HIGH (ref 70–99)
Glucose-Capillary: 109 mg/dL — ABNORMAL HIGH (ref 70–99)

## 2021-04-29 MED ORDER — MAGIC MOUTHWASH W/LIDOCAINE
15.0000 mL | Freq: Four times a day (QID) | ORAL | Status: DC | PRN
  Filled 2021-04-29: qty 15

## 2021-04-29 MED ORDER — LORAZEPAM 2 MG/ML PO CONC: 1.0000 mg | ORAL | Status: DC | PRN

## 2021-04-29 MED ORDER — HALOPERIDOL 1 MG PO TABS
0.5000 mg | ORAL_TABLET | ORAL | Status: DC | PRN
  Administered 2021-04-30 – 2021-05-01 (×2): 0.5 mg via ORAL
  Filled 2021-04-29 (×3): qty 0.5

## 2021-04-29 MED ORDER — LORAZEPAM 2 MG/ML IJ SOLN
1.0000 mg | INTRAMUSCULAR | Status: DC | PRN
  Administered 2021-04-30 – 2021-05-04 (×5): 1 mg via INTRAVENOUS
  Filled 2021-04-29 (×5): qty 1

## 2021-04-29 MED ORDER — POLYVINYL ALCOHOL 1.4 % OP SOLN
1.0000 [drp] | Freq: Four times a day (QID) | OPHTHALMIC | Status: DC | PRN
  Filled 2021-04-29: qty 15

## 2021-04-29 MED ORDER — HALOPERIDOL LACTATE 5 MG/ML IJ SOLN
0.5000 mg | INTRAMUSCULAR | Status: DC | PRN
  Administered 2021-04-30 – 2021-05-02 (×2): 0.5 mg via INTRAVENOUS
  Filled 2021-04-29 (×2): qty 1

## 2021-04-29 MED ORDER — BISACODYL 10 MG RE SUPP: 10.0000 mg | Freq: Every day | RECTAL | Status: DC | PRN

## 2021-04-29 MED ORDER — ONDANSETRON 4 MG PO TBDP: 4.0000 mg | ORAL_TABLET | Freq: Four times a day (QID) | ORAL | Status: DC | PRN

## 2021-04-29 MED ORDER — ALBUMIN HUMAN 25 % IV SOLN: 25.0000 g | Freq: Four times a day (QID) | INTRAVENOUS | Status: DC

## 2021-04-29 MED ORDER — ONDANSETRON HCL 4 MG/2ML IJ SOLN: 4.0000 mg | Freq: Four times a day (QID) | INTRAMUSCULAR | Status: DC | PRN

## 2021-04-29 MED ORDER — SODIUM CHLORIDE 0.9 % IV SOLN
INTRAVENOUS | Status: DC | PRN
  Administered 2021-04-29 (×2): 250 mL via INTRAVENOUS

## 2021-04-29 MED ORDER — SODIUM CHLORIDE 0.9 % IV SOLN
12.5000 mg | Freq: Four times a day (QID) | INTRAVENOUS | Status: DC | PRN
  Filled 2021-04-29: qty 0.5

## 2021-04-29 MED ORDER — LORAZEPAM 1 MG PO TABS
1.0000 mg | ORAL_TABLET | ORAL | Status: DC | PRN
  Administered 2021-04-29 – 2021-05-02 (×6): 1 mg via ORAL
  Filled 2021-04-29 (×7): qty 1

## 2021-04-29 MED ORDER — GLYCOPYRROLATE 0.2 MG/ML IJ SOLN: 0.2000 mg | INTRAMUSCULAR | Status: DC | PRN

## 2021-04-29 MED ORDER — INSULIN ASPART 100 UNIT/ML IJ SOLN
0.0000 [IU] | INTRAMUSCULAR | Status: DC
  Administered 2021-04-29: 3 [IU] via SUBCUTANEOUS

## 2021-04-29 MED ORDER — DIPHENHYDRAMINE HCL 50 MG/ML IJ SOLN: 25.0000 mg | INTRAMUSCULAR | Status: DC | PRN

## 2021-04-29 MED ORDER — GLYCOPYRROLATE 0.2 MG/ML IJ SOLN
0.2000 mg | INTRAMUSCULAR | Status: DC | PRN
  Administered 2021-05-04 (×3): 0.2 mg via INTRAVENOUS
  Filled 2021-04-29 (×3): qty 1

## 2021-04-29 MED ORDER — IPRATROPIUM-ALBUTEROL 0.5-2.5 (3) MG/3ML IN SOLN: 3.0000 mL | RESPIRATORY_TRACT | Status: DC | PRN

## 2021-04-29 MED ORDER — BIOTENE DRY MOUTH MT LIQD: 15.0000 mL | OROMUCOSAL | Status: DC | PRN

## 2021-04-29 MED ORDER — INSULIN GLARGINE-YFGN 100 UNIT/ML ~~LOC~~ SOLN
5.0000 [IU] | Freq: Every day | SUBCUTANEOUS | Status: DC
  Administered 2021-04-29: 5 [IU] via SUBCUTANEOUS
  Filled 2021-04-29: qty 0.05

## 2021-04-29 MED ORDER — NYSTATIN 100000 UNIT/GM EX POWD
Freq: Three times a day (TID) | CUTANEOUS | Status: DC | PRN
  Filled 2021-04-29: qty 15

## 2021-04-29 MED ORDER — HALOPERIDOL LACTATE 2 MG/ML PO CONC
0.5000 mg | ORAL | Status: DC | PRN
  Filled 2021-04-29: qty 0.3

## 2021-04-29 MED ORDER — GLYCOPYRROLATE 1 MG PO TABS: 1.0000 mg | ORAL_TABLET | ORAL | Status: DC | PRN

## 2021-04-29 MED ORDER — ALBUMIN HUMAN 25 % IV SOLN
25.0000 g | Freq: Once | INTRAVENOUS | Status: AC
  Administered 2021-04-29: 25 g via INTRAVENOUS
  Filled 2021-04-29: qty 100

## 2021-04-29 MED ORDER — INSULIN ASPART 100 UNIT/ML IJ SOLN
18.0000 [IU] | Freq: Once | INTRAMUSCULAR | Status: AC
  Administered 2021-04-29: 18 [IU] via SUBCUTANEOUS

## 2021-04-29 NOTE — Progress Notes (Signed)
Patient receiving Albumin at this time. Will continue to monitor BP.

## 2021-04-29 NOTE — Progress Notes (Signed)
Pt received bed 1612 - call placed and given report to RN.  Daughter [name] notified of room transfer. Pt transported via bed to 1612.

## 2021-04-29 NOTE — Evaluation (Addendum)
Physical Therapy Evaluation Patient Details Name: William Buchanan MRN: [id number] DOB: July 13, 1934 Today's Date: 04/29/2021  History of Present Illness  Patient 87 year old gentleman history of COPD with chronic hypoxemia on 2-1/2 L nasal cannula, interstitial lung disease, type 2 diabetes, recent PE on Eliquis, PVD status post left AKA, CVA, CAD status post stenting, hyperlipidemia, recent gangrenous cholecystitis status post biliary drain recently, presented from  SNF Chest x-ray done with patchy right upper lobe consolidation which could represent pneumonia.  D-dimer elevated CT angiogram chest showed resolution of previous PE seen in July with interval development of left lower lobe.  Bronchovascular groundglass opacities suggestive of possible inflammation versus infection.04/14/2021  due to shortness of breath and hypoxia.  On route via EMS patient required 6 L and received 15 L nonrebreather in the ED.  Patient noted to have a leukocytosis,  Clinical Impression  The patient ia alert and talkative, Oriented to self and month. Patient did recall  being in hospital after reminded x 3.  Patient with noted jerking of the limbs, could not hold cup with liquid without dropping. When in sittng, noted jerking of the trunk intermittently.   Did not attempt transfer  at this time  due to poor  control of extremity/trunk movements. Patient recently [place] from [facility] to SNF, anticipate return to SNF for rehab. Pt admitted with above diagnosis.  Pt currently with functional limitations due to the deficits listed below (see PT Problem List). Pt will benefit from skilled PT to increase their independence and safety with mobility to allow discharge to the venue listed below.   While in sitting, assisted patient with a few bites of sausage and pancakes that were chopped. Appeared to swallow the food.  After returning to supine and in  upright position, patient coughed up a piece of whole chopped sausage.RN notified.  Daughter to bring in dentures.     Recommendations for follow up therapy are one component of a multi-disciplinary discharge planning process, led by the attending physician.  Recommendations may be updated based on patient status, additional functional criteria and insurance authorization.  Follow Up Recommendations SNF;Supervision/Assistance - 24 hour;Supervision for mobility/OOB    Equipment Recommendations  None recommended by PT    Recommendations for Other Services       Precautions / Restrictions Precautions Precautions: Fall Precaution Comments: right biliary drain, L AKA      Mobility  Bed Mobility   Bed Mobility: Supine to Sit;Sit to Supine;Rolling     Supine to sit: Max assist Sit to supine: Max assist   General bed mobility comments: multimodal cues, assist with trunk, use of bed pad to scoot around to bed edge. Max Assist with trunk and RLE to return to supine. Mod assist to roll each side for bed pad repositioning    Transfers                 General transfer comment: NT, body extremity jerking, unable to control jerks  Ambulation/Gait                Stairs            Wheelchair Mobility    Modified Rankin (Stroke Patients Only)       Balance Overall balance assessment: Needs assistance Sitting-balance support: Feet supported;No upper extremity supported Sitting balance-Leahy Scale: Fair Sitting balance - Comments: quite kyphotic and props on arms  Pertinent Vitals/Pain Pain Assessment: No/denies pain    Home Living Family/patient expects to be discharged to:: Skilled nursing facility                      Prior Function                 Hand Dominance        Extremity/Trunk Assessment   Upper Extremity Assessment Upper Extremity Assessment:  (noted jerking, unable to hold cup)    Lower Extremity Assessment Lower Extremity Assessment: Generalized  weakness;LLE deficits/detail LLE Deficits / Details: AKA    Cervical / Trunk Assessment Cervical / Trunk Assessment: Other exceptions;Kyphotic Cervical / Trunk Exceptions: noted trunk jerking intermittently  Communication      Cognition Arousal/Alertness: Awake/alert Behavior During Therapy: WFL for tasks assessed/performed Overall Cognitive Status: No family/caregiver present to determine baseline cognitive functioning                                 General Comments: oriented to self, month and "end of month", then to hospital after being told x 3 at intervals.      General Comments      Exercises     Assessment/Plan    PT Assessment Patient needs continued PT services  PT Problem List Decreased strength;Decreased activity tolerance;Decreased balance;Decreased mobility;Pain;Decreased knowledge of use of DME;Decreased safety awareness;Decreased coordination;Decreased cognition       PT Treatment Interventions Functional mobility training;Therapeutic activities;Balance training;Patient/family education    PT Goals (Current goals can be found in the Care Plan section)  Acute Rehab PT Goals Patient Stated Goal: I want something to drink PT Goal Formulation: Patient unable to participate in goal setting Time For Goal Achievement: 05/13/21 Potential to Achieve Goals: Fair    Frequency Min 2X/week   Barriers to discharge        Co-evaluation               AM-PAC PT "6 Clicks" Mobility  Outcome Measure Help needed turning from your back to your side while in a flat bed without using bedrails?: A Lot Help needed moving from lying on your back to sitting on the side of a flat bed without using bedrails?: A Lot Help needed moving to and from a bed to a chair (including a wheelchair)?: Total Help needed standing up from a chair using your arms (e.g., wheelchair or bedside chair)?: Total Help needed to walk in hospital room?: Total Help needed climbing  3-5 steps with a railing? : Total 6 Click Score: 8    End of Session   Activity Tolerance: Patient limited by fatigue Patient left: in bed;with call bell/phone within reach Nurse Communication: Mobility status;Need for lift equipment PT Visit Diagnosis: Other abnormalities of gait and mobility (R26.89);Muscle weakness (generalized) (M62.81);Pain Pain - part of body: Arm    Time: 0935-1000 PT Time Calculation (min) (ACUTE ONLY): 25 min   Charges:   PT Evaluation $PT Eval Low Complexity: 1 Low PT Treatments $Therapeutic Activity: 8-22 mins        Tresa Endo PT Acute Rehabilitation Services Pager [phone] Office [phone]    Claretha Cooper 04/29/2021, 11:39 AM

## 2021-04-29 NOTE — TOC Progression Note (Signed)
Transition of Care [facility]) - Progression Note    Patient Details  Name: William Buchanan MRN: [id number] Date of Birth: 1934/02/27  Transition of Care [facility]) CM/SW Contact  Leeroy Cha, RN Phone Number: 04/29/2021, 12:04 PM  Clinical Narrative:    Fsmily members does not want patient going back to [facility].  Fl2 refaxed out to area snfs.   Expected Discharge Plan: Long Term Nursing Home Barriers to Discharge: Continued Medical Work up  Expected Discharge Plan and Services Expected Discharge Plan: [place]   Discharge Planning Services: CM Consult Post Acute Care Choice: Nursing Home Living arrangements for the past 2 months: [place] (LTC) Expected Discharge Date:  (unknown)                                     Social Determinants of Health (SDOH) Interventions    Readmission Risk Interventions No flowsheet data found.

## 2021-04-29 NOTE — Progress Notes (Signed)
I spoke to William Buchanan daughter William Buchanan today after speaking to her brother yesterday. She agrees that he has deteriorated progressively over the past few months and is not sure that he will get his quality of life back. He has been hallucinating seeing deceased family members recently. She knows he has been sleeping more and only occasionally has any coherent communication with him, just short phrases and he goes back to sleep. She wants to ensure we are focusing on his comfort. She does not think it would be feasible to care for him at the end of his life at home, but agrees that transitioning him to a comfort-focused plan in the hospital is in his best interest. Will update orders to reflect this. Dr. Grandville Silos updated. Patient has already been DNR. Liberalized family visitation. RNs updated after this discussion, who were present just outside the room during this discussion.  William Hy, DO 04/29/21 1:47 PM [facility]'

## 2021-04-29 NOTE — Progress Notes (Addendum)
PROGRESS NOTE    William Buchanan  YQI:[id number] DOB: Mar 02, 1934 DOA: 04/26/2021 PCP: Leota Jacobsen, MD   Chief Complaint  Patient presents with   Altered Mental Status    Brief Narrative:  Patient 87 year old gentleman history of COPD with chronic hypoxemia on 2-1/2 L nasal cannula, interstitial lung disease, type 2 diabetes, recent PE on Eliquis, PVD status post left AKA, CVA, CAD status post stenting, hyperlipidemia, recent gangrenous cholecystitis status post biliary drain presented to the SNF due to shortness of breath and hypoxia.  On route via EMS patient required 6 L and received 15 L nonrebreather in the ED.  Patient noted to have a leukocytosis white count of 20, TSH obtained, BNP 118, troponin of 9.  Chest x-ray done with patchy right upper lobe consolidation which could represent pneumonia.  D-dimer elevated CT angiogram chest showed resolution of previous PE seen in July with interval development of left lower lobe.  Bronchovascular groundglass opacities suggestive of possible inflammation versus infection. -Patient placed on IV vancomycin, IV cefepime.  Patient noted to be hypothermic overnight and into this morning.   Assessment & Plan:   Principal Problem:   Acute on chronic respiratory failure with hypoxia (HCC) Active Problems:   Essential hypertension   Type 2 diabetes mellitus with diabetic polyneuropathy, without long-term current use of insulin (HCC)   S/P AKA (above knee amputation) unilateral, left (HCC)   History of cholecystitis   AKI (acute kidney injury) ([place])   PVD (peripheral vascular disease) (HCC)   CAD (coronary artery disease)   Pressure injury of skin   Hypothermia   Community acquired pneumonia  1 acute on chronic hypoxemic respiratory failure likely secondary to pneumonia, POA -Patient noted to have a baseline O2 requirements of 2.5 L however required 15 L on nonrebreather on presentation to the ED. -Noted to have difficult to obtain  peripheral pulse ox as patient with severe PAD. -VBG done was reassuring with normal pH, no hypercarbia, patient able to speak in full sentences with no use of accessory muscles of respiration. -Sputum gram stain and cultures pending. -Urine Legionella pending, urine strep pneumococcus negative.. -Patient with ongoing hypothermia and as such blood cultures ordered which are pending.  -CT chest findings with left lower lobe groundglass opacities noted concerning for infection versus inflammation. -Patient seen in consultation by PCCM who feel patient may have a component of bilateral lower lobe aspiration pneumonia, viral pneumonia possible and does not feel CT findings are highly suggestive of a ILD flare. -Continue IV cefepime, IV azithromycin. -IV vancomycin has been discontinued. -Continue Pulmicort, Flonase, scheduled duo nebs, PPI. -Supportive care.  2.  Hypothermia -??  Etiology. -May be secondary to acute infectious process secondary to problem #1. -Urinalysis done nitrite negative, leukocytes negative.  Urine strep pneumococcus antigen negative.   -Urine Legionella antigen pending.   -Blood cultures pending.   -Procalcitonin negative.  -TSH noted at 10.568.  Free T4 within normal limits at 0.94. -Abnormal TSH likely secondary to euthyroid sick syndrome and will need repeat labs done in about 4 to 6 weeks in the outpatient setting. -Continue Bair hugger. -PCCM consulted  3.  Recent gangrenous cholecystitis status post biliary drain -Patient noted to have been hospitalized from 7/26-8/10 for severe sepsis secondary to gangrenous cholecystitis status post percutaneous drain placement. -Patient noted to have drain dislodged and was readmitted with sepsis due to possible peritonitis/cholecystitis with successful placement of biliary drain 03/31/2021. -Patient with some right upper quadrant abdominal tenderness to palpation. -Percutaneous drain  with biliary drainage noted. -Right upper  quadrant ultrasound with no acute findings, percutaneous biliary drain which is normally located by preceding CT. -CT abdomen and pelvis with no acute findings or abscess noted.  Stable biliary drain positioning. -Outpatient follow-up with IR.  4.  Acute kidney injury -Likely secondary to prerenal azotemia. -Blood pressure soft with some response to IV fluids.   -Renal function improving with hydration.   -Continue IV fluids.   -Supportive care.   5.  Abnormal TSH -TSH of 10.568. -Free T4 within normal limits at 0.94. -Likely euthyroid sick syndrome which was noted in April at [facility]. -Repeat thyroid function studies in about 4 to 6 weeks.  6.  PVD -Status post AKA, second toe amputation on the right. -Risk factor modification. -Continue Eliquis, statin.  7.  History of recent PE -Continue Eliquis.  8.  Coronary artery disease status post stent placement -Continue statin, Eliquis.  9.  Type 2 diabetes mellitus -Hemoglobin A1c 7.5 (04/02/2021). -CBG 140 this morning however was 457 on labs this morning. -Continue Semglee 5 units daily, SSI.  10.  Irregular heart rhythm -Check a EKG. -Monitor/telemetry with concerns for possible A. fib. -Patient rate is controlled. -Patient already on anticoagulation. -Follow.  11.  Failure to thrive/multiple recent hospital admissions -Per PCCM will discuss with family patient has deteriorated over time and PCCM recommended palliative care consultation for goals of care which has been ordered. -Continue DNR status.  12.  Borderline blood pressure -Yesterday patient noted to have some systolic blood pressures in the 90s. -Patient given the fluid bolus with some response. -We will give a dose of IV albumin this morning.  13.  Pressure injury, POA Pressure Injury 04/28/21 Buttocks Right Stage 2 -  Partial thickness loss of dermis presenting as a shallow open injury with a red, pink wound bed without slough. (Active)   04/28/21 0200  Location: Buttocks  Location Orientation: Right  Staging: Stage 2 -  Partial thickness loss of dermis presenting as a shallow open injury with a red, pink wound bed without slough.  Wound Description (Comments):   Present on Admission: Yes         DVT prophylaxis: Eliquis Code Status: DNR Family Communication: Updated patient.  No family at bedside. Disposition:   Status is: Inpatient  Remains inpatient appropriate because:Inpatient level of care appropriate due to severity of illness  Dispo: The patient is from: SNF              Anticipated d/c is to: SNF              Patient currently is not medically stable to d/c.   Difficult to place patient No       Consultants:  PCCM: Dr. Carlis Abbott 04/28/2021  Procedures:  CT head 04/13/2021 CT angiogram chest 04/03/2021 CT abdomen and pelvis 04/28/2021 Chest x-ray 04/20/2021 Abdominal ultrasound 04/28/2021   Antimicrobials:  IV azithromycin 04/28/2021>>>> IV cefepime 04/22/2021>>>>>. IV vancomycin 04/22/2021>>>> 04/28/2021   Subjective: Sleeping but arousable.  Some slurred speech.  Answering questions appropriately.  Denies any chest pain.  No shortness of breath.  Some complaints of right upper quadrant abdominal pain around drain site.   Objective: Vitals:   04/29/21 0500 04/29/21 0600 04/29/21 0734 04/29/21 0735  BP: 95/62 (!) 103/54    Pulse: 80 77    Resp: 15 13    Temp:      TempSrc:      SpO2: 99% 99% 99% 99%  Weight:  Height:        Intake/Output Summary (Last 24 hours) at 04/29/2021 0946 Last data filed at 04/29/2021 0600 Gross per 24 hour  Intake 3322.02 ml  Output 910 ml  Net 2412.02 ml    Filed Weights   04/01/2021 1906  Weight: 72 kg    Examination:  General exam: NAD. Respiratory system: Decreased bibasilar coarse breath sounds.  No wheezes.  No crackles.  Fair air movement.  No use of accessory muscles of respiration. Cardiovascular system: Irregular irregular.  No JVD, no  murmurs rubs or gallops.  No lower extremity edema.  Gastrointestinal system: RUQ TTP.  Abdomen is soft, nondistended, positive bowel sounds.  No rebound.  No guarding.  Drain in place with biliary drainage noted. Central nervous system: Alert and oriented.  Some slurred speech.  No focal neurological deficits. Extremities: s/p L BKA. Skin: No rashes, lesions or ulcers Psychiatry: Judgement and insight appear fair. Mood & affect appropriate.     Data Reviewed: I have personally reviewed following labs and imaging studies  CBC: Recent Labs  Lab 04/17/2021 1907 04/28/21 0500 04/29/21 0239  WBC 20.2* 19.8* 13.1*  NEUTROABS  --   --  9.9*  HGB 12.7* 11.7* 10.1*  HCT 39.8 36.4* 31.6*  MCV 96.1 96.6 98.1  PLT 696* 519* 450*     Basic Metabolic Panel: Recent Labs  Lab 04/21/2021 1907 04/28/21 0500 04/28/21 0922 04/29/21 0239  NA 138 132*  --  133*  K 4.6 5.5* 5.2* 4.5  CL 107 106  --  110  CO2 21* 18*  --  19*  GLUCOSE 72 158*  --  457*  BUN 62* 53*  --  47*  CREATININE 1.54* 1.31*  --  1.18  CALCIUM 11.4* 10.5*  --  9.3  MG  --   --   --  1.8     GFR: Estimated Creatinine Clearance: 46.6 mL/min (by C-G formula based on SCr of 1.18 mg/dL).  Liver Function Tests: Recent Labs  Lab 04/19/2021 1907  AST 19  ALT 13  ALKPHOS 93  BILITOT 0.5  PROT 7.2  ALBUMIN 2.7*     CBG: Recent Labs  Lab 04/17/2021 1922 04/21/2021 2056 04/28/21 0310 04/29/21 0736  GLUCAP 74 73 140* 175*      Recent Results (from the past 240 hour(s))  Resp Panel by RT-PCR (Flu A&B, Covid) Nasopharyngeal Swab     Status: None   Collection Time: 04/17/2021  8:24 PM   Specimen: Nasopharyngeal Swab; Nasopharyngeal(NP) swabs in vial transport medium  Result Value Ref Range Status   SARS Coronavirus 2 by RT PCR NEGATIVE NEGATIVE Final    Comment: (NOTE) SARS-CoV-2 target nucleic acids are NOT DETECTED.  The SARS-CoV-2 RNA is generally detectable in upper respiratory specimens during the acute  phase of infection. The lowest concentration of SARS-CoV-2 viral copies this assay can detect is 138 copies/mL. A negative result does not preclude SARS-Cov-2 infection and should not be used as the sole basis for treatment or other patient management decisions. A negative result may occur with  improper specimen collection/handling, submission of specimen other than nasopharyngeal swab, presence of viral mutation(s) within the areas targeted by this assay, and inadequate number of viral copies(<138 copies/mL). A negative result must be combined with clinical observations, patient history, and epidemiological information. The expected result is Negative.  Fact Sheet for Patients:  EntrepreneurPulse.com.au  Fact Sheet for Healthcare Providers:  IncredibleEmployment.be  This test is no t yet approved or cleared by  the [employer] and  has been authorized for detection and/or diagnosis of SARS-CoV-2 by FDA under an Emergency Use Authorization (EUA). This EUA will remain  in effect (meaning this test can be used) for the duration of the COVID-19 declaration under Section 564(b)(1) of the Act, 21 U.S.C.section 360bbb-3(b)(1), unless the authorization is terminated  or revoked sooner.       Influenza A by PCR NEGATIVE NEGATIVE Final   Influenza B by PCR NEGATIVE NEGATIVE Final    Comment: (NOTE) The Xpert Xpress SARS-CoV-2/FLU/RSV plus assay is intended as an aid in the diagnosis of influenza from Nasopharyngeal swab specimens and should not be used as a sole basis for treatment. Nasal washings and aspirates are unacceptable for Xpert Xpress SARS-CoV-2/FLU/RSV testing.  Fact Sheet for Patients: EntrepreneurPulse.com.au  Fact Sheet for Healthcare Providers: IncredibleEmployment.be  This test is not yet approved or cleared by the [place] FDA and has been authorized for detection and/or diagnosis of  SARS-CoV-2 by FDA under an Emergency Use Authorization (EUA). This EUA will remain in effect (meaning this test can be used) for the duration of the COVID-19 declaration under Section 564(b)(1) of the Act, 21 U.S.C. section 360bbb-3(b)(1), unless the authorization is terminated or revoked.  Performed at [facility], [place] [address], [place], [place] [address]   MRSA Next Gen by PCR, Nasal     Status: None   Collection Time: 04/28/21 12:29 AM   Specimen: Nasal Mucosa; Nasal Swab  Result Value Ref Range Status   MRSA by PCR Next Gen NOT DETECTED NOT DETECTED Final    Comment: (NOTE) The GeneXpert MRSA Assay (FDA approved for NASAL specimens only), is one component of a comprehensive MRSA colonization surveillance program. It is not intended to diagnose MRSA infection nor to guide or monitor treatment for MRSA infections. Test performance is not FDA approved in patients less than 69 years old. Performed at [facility], [place] [address] [place], [place] [address]   MRSA Next Gen by PCR, Nasal     Status: None   Collection Time: 04/28/21  9:09 AM   Specimen: Nasal Mucosa; Nasal Swab  Result Value Ref Range Status   MRSA by PCR Next Gen NOT DETECTED NOT DETECTED Final    Comment: (NOTE) The GeneXpert MRSA Assay (FDA approved for NASAL specimens only), is one component of a comprehensive MRSA colonization surveillance program. It is not intended to diagnose MRSA infection nor to guide or monitor treatment for MRSA infections. Test performance is not FDA approved in patients less than 16 years old. Performed at [facility], [place] [address] [place], [place] [address]           Radiology Studies: CT HEAD WO CONTRAST (5MM)  Result Date: 04/28/2021 CLINICAL DATA:  Encephalopathy EXAM: CT HEAD WITHOUT CONTRAST TECHNIQUE: Contiguous axial images were obtained from the base of the skull through the vertex without intravenous  contrast. COMPARISON:  02/19/2021 FINDINGS: Brain: There is no mass, hemorrhage or extra-axial collection. There is generalized volume loss. There is hypoattenuation of the white matter, most commonly indicating chronic small vessel disease. Old right frontal and bilateral cerebellar infarcts. Vascular: No abnormal hyperdensity of the major intracranial arteries or dural venous sinuses. No intracranial atherosclerosis. Skull: The visualized skull base, calvarium and extracranial soft tissues are normal. Sinuses/Orbits: No fluid levels or advanced mucosal thickening of the visualized paranasal sinuses. No mastoid or middle ear effusion. The orbits are normal. IMPRESSION: 1. No acute intracranial abnormality.  2. Old right frontal and bilateral cerebellar infarcts and findings of chronic small vessel disease. Electronically Signed   By: Ulyses Jarred M.D.   On: 04/15/2021 21:09   CT Angio Chest PE W and/or Wo Contrast  Result Date: 04/12/2021 CLINICAL DATA:  PE suspected, low/intermediate prob, positive D-dimer. SOB and AMS EXAM: CT ANGIOGRAPHY CHEST WITH CONTRAST TECHNIQUE: Multidetector CT imaging of the chest was performed using the standard protocol during bolus administration of intravenous contrast. Multiplanar CT image reconstructions and MIPs were obtained to evaluate the vascular anatomy. CONTRAST:  2mL OMNIPAQUE IOHEXOL 350 MG/ML SOLN COMPARISON:  Chest x-ray 04/10/2021, CT angio chest 02/18/2021, CT chest 11/26/2020, CT angiography chest 12/26/2016 FINDINGS: Cardiovascular: Satisfactory opacification of the pulmonary arteries to the segmental level. No evidence of pulmonary embolism. Interval resolution of previously identified right lower lobe subsegmental pulmonary embolus on CT angio chest 02/18/2021. The main pulmonary artery is normal in caliber. Normal heart size. No pericardial effusion. Severe atherosclerotic plaque of the aorta. Four-vessel coronary artery calcifications. Mediastinum/Nodes: No  enlarged mediastinal, hilar, or axillary lymph nodes. Thyroid gland, trachea, and esophagus demonstrate no significant findings. Lungs/Pleura: Redemonstration of peripheral cystic changes and reticulations. At least moderate centrilobular emphysematous changes. Stable 2.3 x 2.4 cm subpleural right upper lobe consolidation with cystic changes. Interval development of right lower lobe peribronchovascular subsolid nodular-like densities. Upper Abdomen: Colonic diverticulosis. Transhepatic tube partially visualized status post cholecystectomy no acute abnormality. Musculoskeletal: No chest wall abnormality. No suspicious lytic or blastic osseous lesions. No acute displaced fracture. Multilevel degenerative changes of the spine. Review of the MIP images confirms the above findings. IMPRESSION: 1. No pulmonary embolus. 2. Interval development of left lower lobe peribronchovascular ground-glass airspace opacities. Findings suggestive of infection/inflammation. Non-contrast chest CT at 3-6 months is recommended. If nodules persist, subsequent management will be based upon the most suspicious nodule(s). This recommendation follows the consensus statement: Guidelines for Management of Incidental Pulmonary Nodules Detected on CT Images: From the Fleischner Society 2017; Radiology 2017; 284:228-243. 3. Stable 2.3 x 2.4 cm subpleural right upper lobe consolidation with cystic changes. Finding may represent scarring versus cavitary lesion versus atypical infection. 4. Aortic Atherosclerosis (ICD10-I70.0) and Emphysema (ICD10-J43.9). 5. Electronically Signed   By: Iven Finn M.D.   On: 04/15/2021 22:26   CT ABDOMEN PELVIS W CONTRAST  Result Date: 04/28/2021 CLINICAL DATA:  Abdominal abscess or infection suspected. Right abdominal pain. Percutaneous biliary drainage. EXAM: CT ABDOMEN AND PELVIS WITH CONTRAST TECHNIQUE: Multidetector CT imaging of the abdomen and pelvis was performed using the standard protocol following  bolus administration of intravenous contrast. CONTRAST:  75mL OMNIPAQUE IOHEXOL 350 MG/ML SOLN COMPARISON:  04/02/2021 FINDINGS: Lower chest: Chest CTA performed few hours earlier. Improved aeration at the right base where there is diminished pleural fluid and better aerated lung. Subpleural pulmonary fibrosis. Atheromatous and cardiac calcification. Hepatobiliary: No focal liver abnormality.Cholecystectomy. Percutaneous biliary drain in stable position, reaching the duodenum. Chronic mild dilatation of intrahepatic ducts in the upper right lobe liver which may be related to catheter. Pancreas: No acute finding. Diffuse atrophy and chronic ductal dilatation towards the midline. Spleen: Unremarkable. Adrenals/Urinary Tract: Negative adrenals. No hydronephrosis or obstructing stone. Limited for detecting calculi given contrast excretion at time of imaging. Bilateral renal cortical thinning/scarring. Unremarkable bladder. Stomach/Bowel: No obstruction. No evidence of acute inflammation. Left colonic diverticulosis. Vascular/Lymphatic: No acute vascular abnormality. Severe atheromatous calcification of the aorta and branch vessels. No mass or adenopathy. Reproductive:Unremarkable for age Other: No ascites or pneumoperitoneum. High-density nodule at the inferior right  liver tip which may reflect gallstone as previously suggested. No convincing adjacent inflammation. Musculoskeletal: No acute abnormalities. Osteopenia and generalized spinal degeneration with mild scoliosis. L5 chronic pars defects with L5-S1 anterolisthesis and severe foraminal impingement. IMPRESSION: 1. No acute finding or abscess.  Stable biliary drain positioning. 2. Numerous chronic findings are described above. Electronically Signed   By: Jorje Guild M.D.   On: 04/28/2021 05:11   DG Chest Portable 1 View  Result Date: 04/03/2021 CLINICAL DATA:  Tachypnea, short of breath EXAM: PORTABLE CHEST 1 VIEW COMPARISON:  02/18/2021 FINDINGS: Single  frontal view of the chest demonstrates a stable cardiac silhouette. There is persistent patchy consolidation within the right upper lobe. Chronic background interstitial scarring throughout the lungs, greatest at the bases. No effusion or pneumothorax. No acute bony abnormalities. IMPRESSION: 1. Persistent patchy right upper lobe consolidation, which may reflect pneumonia. 2. Chronic background scarring and fibrosis. Electronically Signed   By: Randa Ngo M.D.   On: 04/19/2021 20:34   US Abdomen Limited RUQ (LIVER/GB)  Result Date: 04/28/2021 CLINICAL DATA:  Recent gangrenous cholecystitis with percutaneous cholecystostomy in biliary drainage. Increased sanguinous material within the biliary drain back. EXAM: ULTRASOUND ABDOMEN LIMITED RIGHT UPPER QUADRANT COMPARISON:  Preceding abdominal CT FINDINGS: Gallbladder: History of cholecystectomy Common bile duct: Dilated at 13 mm, of doubtful significance given normal biliary labs. There is a traversing catheter which is better seen by CT. No evidence of internal hemorrhage or stone. Liver: The biliary drain is seen traversing the liver parenchyma. No evidence of mass or collection. Portal vein is patent on color Doppler imaging with normal direction of blood flow towards the liver. IMPRESSION: 1. No acute finding. 2. Percutaneous biliary drain which is normally located by preceding CT. Electronically Signed   By: Jorje Guild M.D.   On: 04/28/2021 06:16        Scheduled Meds:  (feeding supplement) PROSource Plus  30 mL Oral Daily   apixaban  5 mg Oral BID   ascorbic acid  500 mg Oral Daily   budesonide (PULMICORT) nebulizer solution  0.5 mg Nebulization BID   Chlorhexidine Gluconate Cloth  6 each Topical Daily   cholecalciferol  1,000 Units Oral Daily   feeding supplement  1 Container Oral Q24H   feeding supplement  237 mL Oral Q24H   fluticasone  2 spray Each Nare Daily   gabapentin  100 mg Oral BID   insulin aspart  0-15 Units Subcutaneous  Q4H   insulin glargine-yfgn  5 Units Subcutaneous Daily   loratadine  10 mg Oral Daily   mouth rinse  15 mL Mouth Rinse BID   memantine  5 mg Oral BID   mirtazapine  15 mg Oral QHS   multivitamin with minerals  1 tablet Oral Daily   pantoprazole sodium  40 mg Oral Daily   simvastatin  20 mg Oral q1800   tamsulosin  0.4 mg Oral Daily   vitamin B-12  250 mcg Oral Daily   Continuous Infusions:  sodium chloride 125 mL/hr at 04/29/21 0600   albumin human     azithromycin Stopped (04/28/21 1020)   ceFEPime (MAXIPIME) IV Stopped (04/29/21 0053)     LOS: 2 days    Time spent: 40 minutes    Irine Seal, MD [facility]   To contact the attending provider between 7A-7P or the covering provider during after hours 7P-7A, please log into the web site [email or web address] and access using universal [place] password for that web site. If you  do not have the password, please call the hospital operator.  04/29/2021, 9:46 AM

## 2021-04-29 NOTE — NC FL2 (Signed)
[place] LEVEL OF CARE SCREENING TOOL     IDENTIFICATION  Patient Name: William Buchanan Birthdate: [birth year] Sex: male Admission Date (Current Location): 04/20/2021  [facility] [place] Number:  Herbalist and Address:  [facility],  [place], [place]      Provider Number: [id number]  Attending Physician Name and Address:  Eugenie Filler, MD  Relative Name and Phone Number:       Current Level of Care: Hospital Recommended Level of Care: [place] Prior Approval Number:    Date Approved/Denied:   PASRR Number: [id number]  Discharge Plan: SNF    Current Diagnoses: Patient Active Problem List   Diagnosis Date Noted   History of cholecystitis 04/28/2021   AKI (acute kidney injury) ([place]) 04/28/2021   PVD (peripheral vascular disease) ([place]) 04/28/2021   CAD (coronary artery disease) 04/28/2021   Pressure injury of skin 04/28/2021   Hypothermia 04/28/2021   Community acquired pneumonia    Acute on chronic respiratory failure with hypoxia ([place]) 04/04/2021   Pulmonary embolism ([place]) 04/02/2021   Abdominal pain 03/30/2021   Migration of percutaneous transhepatic biliary drain catheter 03/30/2021   S/P AKA (above knee amputation) unilateral, left ([place]) 03/30/2021   Fall from wheelchair 03/30/2021   Leukocytosis 03/30/2021   COPD (chronic obstructive pulmonary disease) ([place]) 03/30/2021   Hyperkalemia 03/30/2021   Critical lower limb ischemia (HCC) 06/07/2017   Severe protein-calorie malnutrition ([place]) 06/07/2017   Diverticulitis 02/18/2017   Arcus senilis, bilateral 01/10/2017   History of non-ST elevation myocardial infarction (NSTEMI) 12/26/2016   Bilateral carotid artery stenosis 08/25/2016   Benign neoplasm of colon 07/07/2015   Inflammatory polyarthritis ([place]) 07/07/2015   Type 2 diabetes mellitus with diabetic polyneuropathy, without long-term current use of insulin ([place]) 07/07/2015    Carotid atherosclerosis 07/07/2015   Local infection of skin and subcutaneous tissue 09/15/2014   Bunion 09/15/2014   Ulcer of heel and midfoot ([place]) 09/15/2014   Essential hypertension 04/16/2014    Orientation RESPIRATION BLADDER Height & Weight     Self, Place, Situation  Normal Continent Weight: 72 kg Height:  5\' 9"  (175.3 cm)  BEHAVIORAL SYMPTOMS/MOOD NEUROLOGICAL BOWEL NUTRITION STATUS      Continent Diet (regular)  AMBULATORY STATUS COMMUNICATION OF NEEDS Skin   Total Care Verbally Normal                       Personal Care Assistance Level of Assistance  Bathing, Feeding, Dressing, Total care Bathing Assistance: Maximum assistance Feeding assistance: Maximum assistance Dressing Assistance: Maximum assistance Total Care Assistance: Maximum assistance   Functional Limitations Info  Sight, Hearing, Speech Sight Info: Adequate Hearing Info: Adequate Speech Info: Adequate    SPECIAL CARE FACTORS FREQUENCY  PT (By licensed PT), OT (By licensed OT)     PT Frequency: 5 x weekly OT Frequency: 5 x weekly            Contractures Contractures Info: Not present    Additional Factors Info  Code Status, Allergies Code Status Info: dnr Allergies Info: anesthics amide doxycyccline zolpidem amoxicillin           Current Medications (04/29/2021):  This is the current hospital active medication list Current Facility-Administered Medications  Medication Dose Route Frequency Provider Last Rate Last Admin   (feeding supplement) PROSource Plus liquid 30 mL  30 mL Oral Daily Eugenie Filler, MD   30 mL at 04/28/21 1452   0.9 %  sodium chloride infusion   Intravenous Continuous Eugenie Filler, MD 125 mL/hr at 04/29/21 0700 Infusion Verify at 04/29/21 0700   0.9 %  sodium chloride infusion   Intravenous PRN Eugenie Filler, MD 10 mL/hr at 04/29/21 1101 250 mL at 04/29/21 1101   acetaminophen (TYLENOL) tablet 500 mg  500 mg Oral Q6H PRN Tu, Ching T, DO   500 mg at  04/28/21 2106   apixaban (ELIQUIS) tablet 5 mg  5 mg Oral BID Tu, Ching T, DO   5 mg at 04/28/21 2106   ascorbic acid (VITAMIN C) tablet 500 mg  500 mg Oral Daily Eugenie Filler, MD   500 mg at 04/28/21 0921   azithromycin (ZITHROMAX) 500 mg in sodium chloride 0.9 % 250 mL IVPB  500 mg Intravenous Q24H Eugenie Filler, MD 250 mL/hr at 04/29/21 1042 500 mg at 04/29/21 1042   budesonide (PULMICORT) nebulizer solution 0.5 mg  0.5 mg Nebulization BID Eugenie Filler, MD   0.5 mg at 04/29/21 3833   ceFEPIme (MAXIPIME) 2 g in sodium chloride 0.9 % 100 mL IVPB  2 g Intravenous Q12H Thomes Lolling, [facility]   Stopped at 04/29/21 3832   Chlorhexidine Gluconate Cloth 2 % PADS 6 each  6 each Topical Daily Eugenie Filler, MD   6 each at 04/29/21 1026   cholecalciferol (VITAMIN D3) tablet 1,000 Units  1,000 Units Oral Daily Eugenie Filler, MD   1,000 Units at 04/28/21 9191   feeding supplement (BOOST / RESOURCE BREEZE) liquid 1 Container  1 Container Oral Q24H Eugenie Filler, MD       feeding supplement (ENSURE ENLIVE / ENSURE PLUS) liquid 237 mL  237 mL Oral Q24H Eugenie Filler, MD   237 mL at 04/28/21 2035   fluticasone (FLONASE) 50 MCG/ACT nasal spray 2 spray  2 spray Each Nare Daily Eugenie Filler, MD   2 spray at 04/29/21 1026   gabapentin (NEURONTIN) capsule 100 mg  100 mg Oral BID Eugenie Filler, MD   100 mg at 04/28/21 2107   insulin aspart (novoLOG) injection 0-15 Units  0-15 Units Subcutaneous Q4H Anders Simmonds, MD   3 Units at 04/29/21 0750   insulin glargine-yfgn (SEMGLEE) injection 5 Units  5 Units Subcutaneous Daily Eugenie Filler, MD   5 Units at 04/29/21 1028   ipratropium-albuterol (DUONEB) 0.5-2.5 (3) MG/3ML nebulizer solution 3 mL  3 mL Nebulization Q4H PRN Eugenie Filler, MD       loratadine (CLARITIN) tablet 10 mg  10 mg Oral Daily Eugenie Filler, MD   10 mg at 04/28/21 6606   MEDLINE mouth rinse  15 mL Mouth Rinse BID Kathryne Eriksson, NP   15  mL at 04/29/21 1026   memantine (NAMENDA) tablet 5 mg  5 mg Oral BID Tu, Ching T, DO   5 mg at 04/28/21 2108   mirtazapine (REMERON) tablet 15 mg  15 mg Oral QHS Eugenie Filler, MD   15 mg at 04/28/21 2109   multivitamin with minerals tablet 1 tablet  1 tablet Oral Daily Eugenie Filler, MD   1 tablet at 04/28/21 1458   oxyCODONE-acetaminophen (PERCOCET/ROXICET) 5-325 MG per tablet 1 tablet  1 tablet Oral QID PRN Tu, Ching T, DO   1 tablet at 04/29/21 0052   pantoprazole sodium (PROTONIX) 40 mg/20 mL oral suspension 40 mg  40 mg Oral Daily Tu, Ching T, DO   40 mg at  04/28/21 0921   simvastatin (ZOCOR) tablet 20 mg  20 mg Oral q1800 Tu, Ching T, DO   20 mg at 04/28/21 1845   tamsulosin (FLOMAX) capsule 0.4 mg  0.4 mg Oral Daily Tu, Ching T, DO   0.4 mg at 04/28/21 3818   vitamin B-12 (CYANOCOBALAMIN) tablet 250 mcg  250 mcg Oral Daily Eugenie Filler, MD   250 mcg at 04/28/21 4037     Discharge Medications: Please see discharge summary for a list of discharge medications.  Relevant Imaging Results:  Relevant Lab Results:   Additional Information SSN [id number]  Leeroy Cha, RN

## 2021-04-29 NOTE — Progress Notes (Signed)
eLink Physician-Brief Progress Note Patient Name: KASHIUS DOMINIC DOB: 21-Feb-1934 MRN: [id number]   Date of Service  04/29/2021  HPI/Events of Note  Hyperglycemia - Blood glucose = 457.  eICU Interventions  Plan: Novolog 18 units [place] now, then Q 4 moderate Novolog SSI,     Intervention Category Major Interventions: Hyperglycemia - active titration of insulin therapy  Lysle Dingwall 04/29/2021, 3:51 AM

## 2021-04-29 NOTE — Progress Notes (Addendum)
NAME:  William Buchanan, MRN:  [id number], DOB:  [birth year], LOS: 2 ADMISSION DATE:  04/04/2021, CONSULTATION DATE:  04/28/2021 REFERRING MD:  Dr. Grandville Silos, CHIEF COMPLAINT:  hypoxia/ abnormal CT   History of Present Illness:  87 year old male with PMH of centrilobular emphysema, chronic hypoxic respiratory failure on 2L O2, PE on Eliquis (02/19/21 at [facility]), HTN, DMT2, CAD, MI, PVD, GERD, IDA, left AKA, and gangrenous cholecystitis s/p cholecystectomy 5/0/53 complicated by bile leak s/p perc biliary drain [facility] IR) 02/24/21 s/p successful rescue of partially displaced drain w/ [facility] IR 03/31/21 who presented to [facility] ER on 9/27 from SNF with increasing shortness of breath and hypoxia.    Most of HPI obtained from chart review as patient is now very sleepy and worn out.  Per bedside RN, he mostly has been alert and cooperative, as he was able to fully participate in speech evaluation.   On chart review, patient has RUL cavitation dating back to April 2022, seen again in July at [facility], and per notes had negative AFB and patient not interested in further workup/ biopsy.   Noted also on prior chest CT with background emphysema with subpleural reticulation and honeycombing on the right (with peripheral subpleural fibrosis noted on CT in 2018), concerning for NSIP but with negative serologic workup, previously treated with steroids for ILD appearing changes and COPD in July.   On workup, initial pulse oximetry is unknown, as difficulty to get reading, ended up on NRB with EMS.  Labs noted for WBC 20, AKI, BNP 118, and CXR showing patchy RUL consolidation.  Ddimer elevated therefore CTA obtained which showed resolution of prior PE but interval development of LLL peribronchovascular groundglass opacity concerning for inflammation versus infection.  He was started on azithro and ceftriaxone and admitted to [facility].  Given concern for possible ILD flare, pulmonary consulted.   Of note, since admission, he has been  hypothermic, hypotensive but responded to fluids.   He remains a DNR/ DNI.  Pertinent  Medical History  Centrilobular emphysema, chronic hypoxic respiratory failure on 2.5L O2, ILD,  recent PE on Eliquis (02/19/21 at [facility]), HTN, DMT2, CAD, MI, PVD, CVA/ SAH, IDA, GERD, left AKA, gangrenous cholecystitis s/p cholecystectomy 04/07/66 complicated by bile leak s/p perc biliary drain [facility] IR) 02/24/21 s/p successful rescue of partially displaced drain w/ [facility] IR 03/31/21  DNI/ DNR Significant Hospital Events: Including procedures, antibiotic start and stop dates in addition to other pertinent events   9/27 admitted to [facility] with sepsis secondary to pneumonia, acute on chronic hypoxemic respiratory failure 9/28  Pulmonary consult   Interim History / Subjective:  He remains sleepy and minimally arousable. Daughter at bedside; reports he occasionally says something coherent but doesn't open his eyes.  Objective   Blood pressure (!) 90/40, pulse (!) 45, temperature (!) 95.9 F (35.5 C), temperature source Axillary, resp. rate 13, height 5\' 9"  (1.753 m), weight 72 kg, SpO2 100 %.        Intake/Output Summary (Last 24 hours) at 04/29/2021 1708 Last data filed at 04/29/2021 1300 Gross per 24 hour  Intake 3461 ml  Output 535 ml  Net 2926 ml    Filed Weights   04/25/2021 1906  Weight: 72 kg   Examination: General:  chronically ill appearing man lying in bed in NAD HEENT: [place]/AT, eyes anicteric Neuro:  sleeping, moving extremities but globally weak, not able to reposition in bed CV: S1S2, RRR PULM: breathing comfortably on [place], basilar rhales, no accessory muscle use,  minimal tachypnea GI: ND, biliary drain Extremities: L AKA, no cyanosis  Skin: warm, no rashes. Pallor.  Resolved Hospital Problem list    Assessment & Plan:   FTT, severe protein energy malnutrition Acute encephalopathy, recent hallucinations -Discussed with daughter Coralyn Mark at bedside today my concern for progressive illness and  deteriorating functional status. She has observed this as well. She reported he has been hallucinating deceased relatives for a few weeks. She thinks that focusing on comfort is most appropriate at this time, and I agree. I am not sure that life-prolonging interventions will be effective or will ensure that he has improved QOL.  Acute on chronic hypoxic respiratory failure in the setting of pneumonia Hx emphysema Chronic ILD changes on chest CT dating back to 2018 with cystic changes, subpleural reticulation, and honeycombing.  Unable to see images, as these were at [facility].  He did pass his SLP today, no dysphagia present, ongoing recs for dysphagia 3 diet, however do still question if he has some chronic aspiration> daughter agrees.  Doubt acute ILD flair or AECOPD, as he is now almost back to his baseline/ home O2 of 2Ls.   Suspect aspiration pneumonia in bilateral bases.  Also noted stable RUL consolidation, noted back in July 2022, in which patient refused further workup/ biopsy; AFB was neg at [facility].   Clinical picture most consistent with aspiration +/- CAP.  Noted PCT was negative, but would still empirically treat for 5 days. -stopping antibiotics given change in goals of his care -con't bronchodilators for comfort - O2 for comfort -no role for steroids with no wheezing  Sepsis secondary to pneumonia Hypotension AKI  -d/c antibiotics  Hyponatremia likely 2/2 poor PO inatke -no additional monitoring needed  Decubitus ulcer with pain -opiates PRN -tylenol PRN  Chronic anemia -no additional monitoring  Hyperglycemia, DM -no more accuchecks or insulin  GOC discussion >30 minutes spent with daughter Coralyn Mark.  PCCM will sign off but is available if assistance needed.   Best Practice (right click and "Reselect all SmartList Selections" daily)   Diet/type: dysphagia diet (see orders) DVT prophylaxis: not indicated GI prophylaxis: PPI Lines: N/A Foley:  N/A Code Status:  DNR Last  date of multidisciplinary goals of care discussion [ 9/29 - changed to comfort care ]  Labs   CBC: Recent Labs  Lab 04/10/2021 1907 04/28/21 0500 04/29/21 0239  WBC 20.2* 19.8* 13.1*  NEUTROABS  --   --  9.9*  HGB 12.7* 11.7* 10.1*  HCT 39.8 36.4* 31.6*  MCV 96.1 96.6 98.1  PLT 696* 519* 450*     Basic Metabolic Panel: Recent Labs  Lab 04/12/2021 1907 04/28/21 0500 04/28/21 0922 04/29/21 0239  NA 138 132*  --  133*  K 4.6 5.5* 5.2* 4.5  CL 107 106  --  110  CO2 21* 18*  --  19*  GLUCOSE 72 158*  --  457*  BUN 62* 53*  --  47*  CREATININE 1.54* 1.31*  --  1.18  CALCIUM 11.4* 10.5*  --  9.3  MG  --   --   --  1.8    GFR: Estimated Creatinine Clearance: 46.6 mL/min (by C-G formula based on SCr of 1.18 mg/dL). Recent Labs  Lab 04/12/2021 1907 04/28/21 0500 04/28/21 0922 04/29/21 0239 04/29/21 0525  PROCALCITON  --   --  <0.10  --  <0.10  WBC 20.2* 19.8*  --  13.1*  --   LATICACIDVEN  --  1.3  --   --   --  Julian Hy, DO 04/29/21 5:55 PM [facility]

## 2021-04-30 DIAGNOSIS — I2510 Atherosclerotic heart disease of native coronary artery without angina pectoris: Secondary | ICD-10-CM | POA: Diagnosis not present

## 2021-04-30 DIAGNOSIS — J9621 Acute and chronic respiratory failure with hypoxia: Secondary | ICD-10-CM | POA: Diagnosis not present

## 2021-04-30 DIAGNOSIS — J189 Pneumonia, unspecified organism: Secondary | ICD-10-CM | POA: Diagnosis not present

## 2021-04-30 DIAGNOSIS — N179 Acute kidney failure, unspecified: Secondary | ICD-10-CM | POA: Diagnosis not present

## 2021-04-30 LAB — LEGIONELLA PNEUMOPHILA SEROGP 1 UR AG: L. pneumophila Serogp 1 Ur Ag: NEGATIVE

## 2021-04-30 NOTE — Care Management Important Message (Signed)
Medicare IM printed for Social Work at [facility] to give to the patient 

## 2021-04-30 NOTE — TOC Progression Note (Signed)
Transition of Care [facility]) - Progression Note    Patient Details  Name: William Buchanan MRN: [id number] Date of Birth: 06/10/1934  Transition of [facility]) CM/SW Contact  Leeroy Cha, RN Phone Number: 04/30/2021, 8:57 AM  Clinical Narrative:    Tct-daughter Merlinda Frederick. Spoke to daughter about future plans for the patient.  She does not have a concern about him going back to [facility].  Thought he would stay here at [facility] until death.  Explained to her that once he is stable and not actively dying that he can return to [facility] with hospice care.  Or that if they wanted he could go to [facility].  She wants to talk to the md for clarification.  My name and number left with daughter.  Expected Discharge Plan: Long Term Nursing Home Barriers to Discharge: Continued Medical Work up  Expected Discharge Plan and Services Expected Discharge Plan: [place]   Discharge Planning Services: CM Consult Post Acute Care Choice: Nursing Home Living arrangements for the past 2 months: [place] (LTC) Expected Discharge Date:  (unknown)                                     Social Determinants of Health (SDOH) Interventions    Readmission Risk Interventions No flowsheet data found.

## 2021-04-30 NOTE — Progress Notes (Signed)
Chaplain spent time with Gwyndolyn Saxon and [employer] of presence.  William Buchanan was confused during the visit and was not sure where he was or why.  Chaplain provided support to family via telephone call to daughter.  His daughter feels that she is making the right decision, but acknowledges that it is very hard.  Chaplain encouraged self-care  Chaplain Janne Napoleon, [place] Pager, [phone]

## 2021-04-30 NOTE — Progress Notes (Signed)
Return call to patient's daughter, [name], to give an update on her Dad after transferring to the floor this afternoon from ICU. She appreciated the call and the care.

## 2021-04-30 NOTE — Progress Notes (Signed)
PROGRESS NOTE    William Buchanan  UMP:[id number] DOB: Sep 19, 1933 DOA: 04/07/2021 PCP: Leota Jacobsen, MD   Chief Complaint  Patient presents with   Altered Mental Status    Brief Narrative:  Patient 87 year old gentleman history of COPD with chronic hypoxemia on 2-1/2 L nasal cannula, interstitial lung disease, type 2 diabetes, recent PE on Eliquis, PVD status post left AKA, CVA, CAD status post stenting, hyperlipidemia, recent gangrenous cholecystitis status post biliary drain presented to the SNF due to shortness of breath and hypoxia.  On route via EMS patient required 6 L and received 15 L nonrebreather in the ED.  Patient noted to have a leukocytosis white count of 20, TSH obtained, BNP 118, troponin of 9.  Chest x-ray done with patchy right upper lobe consolidation which could represent pneumonia.  D-dimer elevated CT angiogram chest showed resolution of previous PE seen in July with interval development of left lower lobe.  Bronchovascular groundglass opacities suggestive of possible inflammation versus infection. -Patient placed on IV vancomycin, IV cefepime.  Patient noted to be hypothermic overnight and into this morning.   Assessment & Plan:   Principal Problem:   Acute on chronic respiratory failure with hypoxia (HCC) Active Problems:   Essential hypertension   Type 2 diabetes mellitus with diabetic polyneuropathy, without long-term current use of insulin (HCC)   S/P AKA (above knee amputation) unilateral, left (HCC)   History of cholecystitis   AKI (acute kidney injury) ([facility])   PVD (peripheral vascular disease) (HCC)   CAD (coronary artery disease)   Pressure injury of skin   Hypothermia   Community acquired pneumonia  1 acute on chronic hypoxemic respiratory failure likely secondary to pneumonia, POA -Patient noted to have a baseline O2 requirements of 2.5 L however required 15 L on nonrebreather on presentation to the ED. -Noted to have difficult to obtain  peripheral pulse ox as patient with severe PAD. -VBG done was reassuring with normal pH, no hypercarbia, patient able to speak in full sentences with no use of accessory muscles of respiration. -Sputum gram stain and cultures pending. -Urine Legionella pending, urine strep pneumococcus negative.. -Patient with ongoing hypothermia and as such blood cultures ordered which are pending.  -CT chest findings with left lower lobe groundglass opacities noted concerning for infection versus inflammation. -Patient seen in consultation by PCCM who feel patient may have a component of bilateral lower lobe aspiration pneumonia, viral pneumonia possible and does not feel CT findings are highly suggestive of a ILD flare. -Patient initially was on IV vancomycin which was discontinued and patient subsequently kept on IV cefepime and IV azithromycin as well as Pulmicort, Flonase, scheduled duo nebs, PPI.   -Patient seen by PCCM who discussed with family and patient has been transitioned to full comfort measures.   -IV antibiotics have subsequently been discontinued.    2.  Hypothermia -??  Etiology. -May be secondary to acute infectious process secondary to problem #1. -Urinalysis done nitrite negative, leukocytes negative.  Urine strep pneumococcus antigen negative.   -Urine Legionella antigen negative. -Blood cultures pending with no growth to date..   -Procalcitonin negative.  -TSH noted at 10.568.  Free T4 within normal limits at 0.94. -Abnormal TSH likely secondary to euthyroid sick syndrome and will need repeat labs done in about 4 to 6 weeks in the outpatient setting. -Hypothermia improved.   -Patient transition to full comfort measures.   -IV antibiotics discontinued.   3.  Recent gangrenous cholecystitis status post biliary drain -Patient noted  to have been hospitalized from 7/26-8/10 for severe sepsis secondary to gangrenous cholecystitis status post percutaneous drain placement. -Patient noted to  have drain dislodged and was readmitted with sepsis due to possible peritonitis/cholecystitis with successful placement of biliary drain 03/31/2021. -Patient with some right upper quadrant abdominal tenderness to palpation. -Percutaneous drain with biliary drainage noted. -Right upper quadrant ultrasound with no acute findings, percutaneous biliary drain which is normally located by preceding CT. -CT abdomen and pelvis with no acute findings or abscess noted.  Stable biliary drain positioning. -Outpatient follow-up with IR.  4.  Acute kidney injury -Likely secondary to prerenal azotemia. -Blood pressure soft with some response to IV fluids.   -Renal function improving with hydration.  -Patient transitioned to full comfort measures.  5.  Abnormal TSH -TSH of 10.568. -Free T4 within normal limits at 0.94. -Likely euthyroid sick syndrome which was noted in April at [facility]. -Patient transitioned to full comfort measures.  6.  PVD -Status post AKA, second toe amputation on the right. -Risk factor modification. -Patient transition to full comfort measures and as such Eliquis Have been discontinued.   7.  History of recent PE -Patient transition to full comfort measures. -Eliquis discontinued. .  8.  Coronary artery disease status post stent placement -Patient transition to full comfort measures.  Eliquis and statin have been discontinued.   9.  Type 2 diabetes mellitus -Hemoglobin A1c 7.5 (04/02/2021). -Patient has been transition to full comfort measures.   -CBGs discontinued.  -Semglee and SSI discontinued.  10.  Irregular heart rhythm -EKG showed sinus rhythm with some PACs.   -Patient back in normal sinus rhythm.   -Patient has been transitioned to full comfort measures.  11.  Failure to thrive/multiple recent hospital admissions -Per PCCM they discussed with family patient has deteriorated over time and PCCM recommended palliative care consultation for  goals of care which has been ordered. -Continue DNR status. -PCCM discussed with family and decision was made to transition to full comfort measures.  12.  Borderline blood pressure -Yesterday patient noted to have some systolic blood pressures in the 90s. -Patient given the fluid bolus with some response. -Patient seen by PCCM will discuss with family and patient has been transitioned to full comfort measures.  13.  Pressure injury, POA Pressure Injury 04/28/21 Buttocks Right Stage 2 -  Partial thickness loss of dermis presenting as a shallow open injury with a red, pink wound bed without slough. (Active)  04/28/21 0200  Location: Buttocks  Location Orientation: Right  Staging: Stage 2 -  Partial thickness loss of dermis presenting as a shallow open injury with a red, pink wound bed without slough.  Wound Description (Comments):   Present on Admission: Yes         DVT prophylaxis: Comfort care Code Status: DNR Family Communication: Updated patient.  Updated daughter at bedside.  Disposition:   Status is: Inpatient  Remains inpatient appropriate because:Inpatient level of care appropriate due to severity of illness  Dispo: The patient is from: SNF              Anticipated d/c is to: SNF with hospice following versus residential hospice home              Patient currently is not medically stable to d/c.   Difficult to place patient No       Consultants:  PCCM: Dr. Carlis Abbott 04/28/2021 Palliative care pending  Procedures:  CT head 04/21/2021 CT angiogram chest 04/21/2021 CT abdomen  and pelvis 04/28/2021 Chest x-ray 04/19/2021 Abdominal ultrasound 04/28/2021   Antimicrobials:  IV azithromycin 04/28/2021>>>> 04/29/2021 IV cefepime 04/10/2021>>>>> 04/29/2021. IV vancomycin 04/16/2021>>>> 04/28/2021   Subjective: Awake and alert.  Daughter at bedside.  Denies any chest pain.  No shortness of breath.  No abdominal pain.   Objective: Vitals:   04/29/21 1700 04/29/21 2008  04/30/21 0411 04/30/21 0731  BP:   104/63   Pulse: 69  84   Resp: 20  16   Temp:   97.6 F (36.4 C)   TempSrc:   Oral   SpO2: 100% 99% 96% 96%  Weight:      Height:        Intake/Output Summary (Last 24 hours) at 04/30/2021 1326 Last data filed at 04/30/2021 0424 Gross per 24 hour  Intake --  Output 770 ml  Net -770 ml    Filed Weights   04/14/2021 1906  Weight: 72 kg    Examination:  General exam: NAD. Respiratory system: Clear to auscultation anterior lung fields.  No wheezes, no crackles, no rhonchi.  Fair air movement.  No use of accessory muscles of respiration.  Cardiovascular system: RRR no murmurs rubs or gallops.  No JVD.  No lower extremity edema.   Gastrointestinal system: Abdomen is soft, nontender, nondistended, positive bowel sounds.  Drain in place with biliary drainage noted Central nervous system: Alert and oriented.  Some slurred speech.  No focal neurological deficits. Extremities: s/p L BKA. Skin: No rashes, lesions or ulcers Psychiatry: Judgement and insight appear fair. Mood & affect appropriate.     Data Reviewed: I have personally reviewed following labs and imaging studies  CBC: Recent Labs  Lab 04/22/2021 1907 04/28/21 0500 04/29/21 0239  WBC 20.2* 19.8* 13.1*  NEUTROABS  --   --  9.9*  HGB 12.7* 11.7* 10.1*  HCT 39.8 36.4* 31.6*  MCV 96.1 96.6 98.1  PLT 696* 519* 450*     Basic Metabolic Panel: Recent Labs  Lab 04/13/2021 1907 04/28/21 0500 04/28/21 0922 04/29/21 0239  NA 138 132*  --  133*  K 4.6 5.5* 5.2* 4.5  CL 107 106  --  110  CO2 21* 18*  --  19*  GLUCOSE 72 158*  --  457*  BUN 62* 53*  --  47*  CREATININE 1.54* 1.31*  --  1.18  CALCIUM 11.4* 10.5*  --  9.3  MG  --   --   --  1.8     GFR: Estimated Creatinine Clearance: 46.6 mL/min (by C-G formula based on SCr of 1.18 mg/dL).  Liver Function Tests: Recent Labs  Lab 04/09/2021 1907  AST 19  ALT 13  ALKPHOS 93  BILITOT 0.5  PROT 7.2  ALBUMIN 2.7*      CBG: Recent Labs  Lab 04/02/2021 1922 04/29/2021 2056 04/28/21 0310 04/29/21 0736 04/29/21 1154  GLUCAP 74 73 140* 175* 109*      Recent Results (from the past 240 hour(s))  Resp Panel by RT-PCR (Flu A&B, Covid) Nasopharyngeal Swab     Status: None   Collection Time: 04/22/2021  8:24 PM   Specimen: Nasopharyngeal Swab; Nasopharyngeal(NP) swabs in vial transport medium  Result Value Ref Range Status   SARS Coronavirus 2 by RT PCR NEGATIVE NEGATIVE Final    Comment: (NOTE) SARS-CoV-2 target nucleic acids are NOT DETECTED.  The SARS-CoV-2 RNA is generally detectable in upper respiratory specimens during the acute phase of infection. The lowest concentration of SARS-CoV-2 viral copies this assay can detect is  138 copies/mL. A negative result does not preclude SARS-Cov-2 infection and should not be used as the sole basis for treatment or other patient management decisions. A negative result may occur with  improper specimen collection/handling, submission of specimen other than nasopharyngeal swab, presence of viral mutation(s) within the areas targeted by this assay, and inadequate number of viral copies(<138 copies/mL). A negative result must be combined with clinical observations, patient history, and epidemiological information. The expected result is Negative.  Fact Sheet for Patients:  EntrepreneurPulse.com.au  Fact Sheet for Healthcare Providers:  IncredibleEmployment.be  This test is no t yet approved or cleared by the [place] FDA and  has been authorized for detection and/or diagnosis of SARS-CoV-2 by FDA under an Emergency Use Authorization (EUA). This EUA will remain  in effect (meaning this test can be used) for the duration of the COVID-19 declaration under Section 564(b)(1) of the Act, 21 U.S.C.section 360bbb-3(b)(1), unless the authorization is terminated  or revoked sooner.       Influenza A by PCR NEGATIVE  NEGATIVE Final   Influenza B by PCR NEGATIVE NEGATIVE Final    Comment: (NOTE) The Xpert Xpress SARS-CoV-2/FLU/RSV plus assay is intended as an aid in the diagnosis of influenza from Nasopharyngeal swab specimens and should not be used as a sole basis for treatment. Nasal washings and aspirates are unacceptable for Xpert Xpress SARS-CoV-2/FLU/RSV testing.  Fact Sheet for Patients: EntrepreneurPulse.com.au  Fact Sheet for Healthcare Providers: IncredibleEmployment.be  This test is not yet approved or cleared by the [place] FDA and has been authorized for detection and/or diagnosis of SARS-CoV-2 by FDA under an Emergency Use Authorization (EUA). This EUA will remain in effect (meaning this test can be used) for the duration of the COVID-19 declaration under Section 564(b)(1) of the Act, 21 U.S.C. section 360bbb-3(b)(1), unless the authorization is terminated or revoked.  Performed at [facility], [place] [address], [place], [place] [address]   MRSA Next Gen by PCR, Nasal     Status: None   Collection Time: 04/28/21 12:29 AM   Specimen: Nasal Mucosa; Nasal Swab  Result Value Ref Range Status   MRSA by PCR Next Gen NOT DETECTED NOT DETECTED Final    Comment: (NOTE) The GeneXpert MRSA Assay (FDA approved for NASAL specimens only), is one component of a comprehensive MRSA colonization surveillance program. It is not intended to diagnose MRSA infection nor to guide or monitor treatment for MRSA infections. Test performance is not FDA approved in patients less than 5 years old. Performed at [facility], [place] [address] [place], [place] [address]   MRSA Next Gen by PCR, Nasal     Status: None   Collection Time: 04/28/21  9:09 AM   Specimen: Nasal Mucosa; Nasal Swab  Result Value Ref Range Status   MRSA by PCR Next Gen NOT DETECTED NOT DETECTED Final    Comment: (NOTE) The GeneXpert MRSA Assay (FDA  approved for NASAL specimens only), is one component of a comprehensive MRSA colonization surveillance program. It is not intended to diagnose MRSA infection nor to guide or monitor treatment for MRSA infections. Test performance is not FDA approved in patients less than 36 years old. Performed at [facility], [place] [address], [place], [place] [address]   Culture, blood (Routine X 2) w Reflex to ID Panel     Status: None (Preliminary result)   Collection Time: 04/28/21 12:25 PM   Specimen: BLOOD  Result Value Ref Range Status  Specimen Description   Final    BLOOD LEFT ANTECUBITAL Performed at [facility] [address] [place], [place] [address]    Special Requests   Final    BOTTLES DRAWN AEROBIC ONLY Blood Culture adequate volume Performed at [facility] [address], [place], [place] [address]    Culture   Final    NO GROWTH 2 DAYS Performed at [facility] [address] [place], [place] [address]    Report Status PENDING  Incomplete  Culture, blood (Routine X 2) w Reflex to ID Panel     Status: None (Preliminary result)   Collection Time: 04/28/21 12:25 PM   Specimen: BLOOD  Result Value Ref Range Status   Specimen Description   Final    BLOOD LEFT ANTECUBITAL Performed at [facility] [address] [place], [place] [address]    Special Requests   Final    BOTTLES DRAWN AEROBIC ONLY Blood Culture adequate volume Performed at [facility] [address], [place], [place] [address]    Culture   Final    NO GROWTH 2 DAYS Performed at [facility] [address], [place], [place] [address]    Report Status PENDING  Incomplete  Urine Culture     Status: None   Collection Time: 04/28/21  5:03 PM   Specimen: Urine, Catheterized  Result Value Ref Range Status   Specimen Description   Final    URINE, CATHETERIZED Performed at [facility] [address] [place], [place] [address]    Special Requests   Final    NONE Performed at [facility], [place] [address], [place], [place] [address]    Culture   Final    NO GROWTH Performed at [facility], [place] [address], [place],  [address]    Report Status 04/29/2021 FINAL  Final          Radiology Studies: No results found.      Scheduled Meds:  (feeding supplement) PROSource Plus  30 mL Oral Daily   ascorbic acid  500 mg Oral Daily   budesonide (PULMICORT) nebulizer solution  0.5 mg Nebulization BID   Chlorhexidine Gluconate Cloth  6 each Topical Daily   cholecalciferol  1,000 Units Oral Daily   feeding supplement  1 Container Oral Q24H   feeding supplement  237 mL Oral Q24H   fluticasone  2 spray Each Nare Daily   gabapentin  100 mg Oral BID   loratadine  10 mg Oral Daily   mouth rinse  15 mL Mouth Rinse BID   memantine  5 mg Oral BID   mirtazapine  15 mg Oral QHS   multivitamin with minerals  1 tablet Oral Daily   pantoprazole sodium  40 mg Oral Daily   tamsulosin  0.4 mg Oral Daily   vitamin B-12  250 mcg Oral Daily   Continuous Infusions:  sodium chloride 10 mL/hr at 04/29/21 1300   chlorproMAZINE (THORAZINE) IV       LOS: 3 days    Time spent: 40 minutes    Irine Seal, MD [facility]   To contact the attending provider between 7A-7P or the covering provider during after hours 7P-7A, please log into the web site [email or web address] and access using universal [place] password for that web site. If you do not have the password, please call the hospital operator.  04/30/2021, 1:26 PM

## 2021-05-01 DIAGNOSIS — J189 Pneumonia, unspecified organism: Secondary | ICD-10-CM | POA: Diagnosis not present

## 2021-05-01 DIAGNOSIS — N179 Acute kidney failure, unspecified: Secondary | ICD-10-CM | POA: Diagnosis not present

## 2021-05-01 DIAGNOSIS — I2510 Atherosclerotic heart disease of native coronary artery without angina pectoris: Secondary | ICD-10-CM | POA: Diagnosis not present

## 2021-05-01 DIAGNOSIS — Z515 Encounter for palliative care: Secondary | ICD-10-CM

## 2021-05-01 DIAGNOSIS — Z7189 Other specified counseling: Secondary | ICD-10-CM | POA: Diagnosis not present

## 2021-05-01 DIAGNOSIS — J9621 Acute and chronic respiratory failure with hypoxia: Secondary | ICD-10-CM | POA: Diagnosis not present

## 2021-05-01 MED ORDER — MORPHINE SULFATE 15 MG PO TABS
15.0000 mg | ORAL_TABLET | ORAL | Status: DC | PRN
Start: 2021-05-01 — End: 2021-05-03
  Administered 2021-05-01 – 2021-05-02 (×2): 15 mg via ORAL
  Filled 2021-05-01 (×2): qty 1

## 2021-05-01 MED ORDER — HYDROMORPHONE HCL 1 MG/ML IJ SOLN
0.5000 mg | INTRAMUSCULAR | Status: DC | PRN
  Administered 2021-05-01 – 2021-05-03 (×12): 0.5 mg via INTRAVENOUS
  Filled 2021-05-01 (×12): qty 0.5

## 2021-05-01 NOTE — Consult Note (Signed)
Consultation Note Date: 05/01/2021   Patient Name: William Buchanan  DOB: 28-Mar-1934  MRN: [id number]  Age / Sex: 87 y.o., male  PCP: Leota Jacobsen, MD Referring Physician: Eugenie Filler, MD  Reason for Consultation: Terminal Care  HPI/Patient Profile: 87 y.o. male  with past medical history of   COPD, on 2-1/2 L of oxygen at baseline, interstitial lung disease, diabetes recent PE, peripheral vascular disease status post left extremity amputation, history of stroke coronary artery disease recent gangrenous cholecystitis status post biliary drain.   Admitted on 04/06/2021    Clinical Assessment and Goals of Care: Patient is 87 years old.  He has centrilobular emphysema, chronic hypoxic respiratory failure.  More recently he had gangrenous cholecystitis status postcholecystectomy complicated by bile leak, status post percutaneous biliary drain.  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 Patient admitted to the hospital at [facility] in [place], [place] for failure to thrive, severe protein calorie malnutrition, acute encephalopathy and recent hallucinations.  Patient also admitted for acute on chronic hypoxic respiratory failure in the setting of pneumonia and sepsis due to pneumonia.  Initially on pulmonary critical care medicine service and then transition to hospital medicine, started on comfort measures.  Palliative medicine consultation for terminal care as well as appropriate disposition options has been requested. And is resting in bed.  He appears extremely frail.  He has kyphosis of the back.  He opens his eyes to voice command but does not verbalize.  He appears frail.  Medication history noted.  Chart reviewed.  Call placed and discussed with patient's  daughter William Buchanan. William Buchanan describes the patient as a very jovial self-sufficient and independent person.  She states that she is well aware of his ongoing decline and wishes to keep him as comfortable as possible.  She states that she knows he is not can get better and wishes to continue with comfort measures.  Patient has told her in the past that he was tired and that he did not know why he was still alive.  He has been having visions of deceased relatives for the past few weeks now. We discussed about continuing comfort care, as well as appropriate disposition options.  Hospice philosophy of care introduced.  Transitioning back to SNF versus residential hospice options discussed.  William Buchanan is from [place] and is familiar with hospice of the [place].  We discussed about appropriateness of proceeding with residential hospice.  NEXT OF KIN Daughter William Buchanan  SUMMARY OF RECOMMENDATIONS   DNR/DNI Add IV Dilaudid as needed for pain Disposition: Recommend residential hospice.  Call placed and discussed with daughter William Buchanan.  She lives in [place], choices discussed, she is familiar with hospice of the [place], place [facility] consult. Prognosis appears 2 weeks or less in my opinion. Thank you for the consult. Discontinue medications no longer contributing to comfort, discontinue vitamin C, B12 multivitamins as well as  Namenda.  Code Status/Advance Care Planning: DNR   Symptom Management:     Palliative Prophylaxis:  Delirium Protocol  Additional Recommendations (Limitations, Scope, Preferences): Full Comfort Care  Psycho-social/Spiritual:  Desire for further Chaplaincy support:yes Additional Recommendations: Education on Hospice  Prognosis:  < 2 weeks  Discharge Planning: Hospice facility      Primary Diagnoses: Present on Admission:  Acute on chronic respiratory failure with hypoxia ([place])  Essential hypertension  Type 2 diabetes mellitus with diabetic polyneuropathy, without  long-term current use of insulin (HCC)  Pressure injury of skin   I have reviewed the medical record, interviewed the patient and family, and examined the patient. The following aspects are pertinent.  Past Medical History:  Diagnosis Date   Arthritis    Cancer ([place])    Cholecystitis    Complication of anesthesia    Diabetes mellitus without complication ([place])    GERD (gastroesophageal reflux disease)    Hypertension    Myocardial infarction [facility])    Peripheral vascular disease ([place])    Social History   Socioeconomic History   Marital status: Widowed    Spouse name: Not on file   Number of children: Not on file   Years of education: Not on file   Highest education level: Not on file  Occupational History   Not on file  Tobacco Use   Smoking status: Former    Years: 20.00    Types: Cigarettes    Quit date: 79    Years since quitting: 32.7   Smokeless tobacco: Never  Vaping Use   Vaping Use: Never used  Substance and Sexual Activity   Alcohol use: Not Currently    Comment: occasional, 5 months since any EtOH   Drug use: Not Currently   Sexual activity: Not on file  Other Topics Concern   Not on file  Social History Narrative   Not on file   Social Determinants of Health   Financial Resource Strain: Not on file  Food Insecurity: Not on file  Transportation Needs: Not on file  Physical Activity: Not on file  Stress: Not on file  Social Connections: Not on file   History reviewed. No pertinent family history. Scheduled Meds:  (feeding supplement) PROSource Plus  30 mL Oral Daily   budesonide (PULMICORT) nebulizer solution  0.5 mg Nebulization BID   Chlorhexidine Gluconate Cloth  6 each Topical Daily   cholecalciferol  1,000 Units Oral Daily   feeding supplement  1 Container Oral Q24H   feeding supplement  237 mL Oral Q24H   fluticasone  2 spray Each Nare Daily   gabapentin  100 mg Oral BID   loratadine  10 mg Oral Daily   mouth rinse  15 mL Mouth Rinse  BID   mirtazapine  15 mg Oral QHS   pantoprazole sodium  40 mg Oral Daily   tamsulosin  0.4 mg Oral Daily   Continuous Infusions:  sodium chloride 10 mL/hr at 04/29/21 1300   chlorproMAZINE (THORAZINE) IV     PRN Meds:.sodium chloride, acetaminophen, antiseptic oral rinse, bisacodyl, chlorproMAZINE (THORAZINE) IV, diphenhydrAMINE, glycopyrrolate **OR** glycopyrrolate **OR** glycopyrrolate, haloperidol **OR** haloperidol **OR** haloperidol lactate, HYDROmorphone (DILAUDID) injection, ipratropium-albuterol, LORazepam **OR** LORazepam **OR** LORazepam, magic mouthwash w/lidocaine, morphine, nystatin, ondansetron **OR** ondansetron (ZOFRAN) IV, polyvinyl alcohol Medications Prior to Admission:  Prior to Admission medications   Medication Sig Start Date End Date Taking? Authorizing Provider  acetaminophen (TYLENOL) 500 MG tablet Take 500 mg by mouth every 6 (six) hours as needed for  moderate pain.   Yes [provider]  apixaban (ELIQUIS) 5 MG TABS tablet Take 1 tablet by mouth in the morning and at bedtime. MD Ordered 1 tablet BID / But according to [facility] , patient seems to only been taking, 1 TABLET DAILY. 03/10/21 06/08/21 Yes [provider]  ascorbic acid (VITAMIN C) 500 MG tablet Take 500 mg by mouth daily.   Yes [provider]  cephALEXin (KEFLEX) 500 MG capsule Take 500 mg by mouth 2 (two) times daily. 04/16/21  Yes [provider]  cetirizine (ZYRTEC) 10 MG tablet Take 1 tablet by mouth daily. 01/20/21  Yes [provider]  Cholecalciferol 25 MCG (1000 UT) tablet Take 1 tablet by mouth daily.   Yes [provider]  ciprofloxacin (CIPRO) 500 MG tablet Take 500 mg by mouth 2 (two) times daily. 04/23/21  Yes [provider]  Fluticasone-Umeclidin-Vilant (TRELEGY ELLIPTA) 100-62.5-25 MCG/INH AEPB Inhale 1 puff into the lungs daily. 04/06/21  Yes Mercy Riding, MD  gabapentin (NEURONTIN) 300 MG capsule Take 100 mg by mouth 2 (two) times  daily.   Yes Deloria Lair, NP  glipiZIDE (GLUCOTROL) 5 MG tablet Take 5 mg by mouth 2 (two) times daily. 09/02/14  Yes [provider]  HUMALOG KWIKPEN 100 UNIT/ML KwikPen Inject 3-15 Units into the skin See admin instructions. Sliding Scale  blood sugar 101-150 = 3units, 151-200= 4units, 201-250 = 7units, 251-300 = 9units, 301-350 = 12units, >350 = 15units 04/22/21  Yes [provider]  ipratropium-albuterol (DUONEB) 0.5-2.5 (3) MG/3ML SOLN Take 3 mLs by nebulization every 6 (six) hours as needed (Shortness of breath, cough and/or wheeze). 04/06/21  Yes Mercy Riding, MD  memantine (NAMENDA) 5 MG tablet Take 5 mg by mouth 2 (two) times daily. 03/10/21  Yes [provider]  mirtazapine (REMERON) 15 MG tablet Take 15 mg by mouth at bedtime. 04/22/21  Yes [provider]  Multiple Vitamins-Minerals (MULTIVITAMIN WITH MINERALS) tablet Take 1 tablet by mouth daily.   Yes [provider]  mupirocin ointment (BACTROBAN) 2 % Apply 1 application topically 2 (two) times daily. 10/11/20  Yes [provider]  oxyCODONE-acetaminophen (PERCOCET/ROXICET) 5-325 MG tablet Take 1 tablet by mouth 4 (four) times daily as needed. 04/12/21  Yes [provider]  pantoprazole sodium (PROTONIX) 40 mg/20 mL SUSP Take 40 mg by mouth daily.   Yes [provider]  pioglitazone (ACTOS) 30 MG tablet  08/25/14  Yes [provider]  PRESCRIPTION MEDICATION Take 120 mLs by mouth 2 (two) times daily. MED PASS Supplement, may substitute equal amounts of Health Shake if Med Pass isn't available   Yes [provider]  simvastatin (ZOCOR) 20 MG tablet Take 20 mg by mouth at bedtime. 09/13/14  Yes [provider]  tamsulosin (FLOMAX) 0.4 MG CAPS capsule Take 0.4 mg by mouth daily. 03/27/21  Yes [provider]  vitamin B-12 (CYANOCOBALAMIN) 250 MCG tablet Take 250 mcg by mouth daily.   Yes [provider]   Allergies  Allergen  Reactions   Anesthetics, Amide     Made me crazy and hard to handle.   Doxycycline Shortness Of Breath   Zolpidem     Other reaction(s): Mental Status Changes (intolerance) "combative"   Amoxicillin-Pot Clavulanate Rash        Review of Systems Patient does not verbalize or interact. Physical Exam Frail-appearing gentleman resting in bed Appears with chronic generalized weakness, appears with ongoing decline Shallow regular respirations Has abdominal drain Opens  eyes when his name is called however does not verbalize Has left-sided BKA  Vital Signs: BP 128/75 (BP Location: Right Arm)   Pulse 89   Temp (!) 97.5 F (36.4 C) (Oral)   Resp 18   Ht 5\' 9"  (1.753 m)   Wt 72 kg   SpO2 96%   BMI 23.44 kg/m  Pain Scale: PAINAD   Pain Score: Asleep   SpO2: SpO2: 96 % O2 Device:SpO2: 96 % O2 Flow Rate: .O2 Flow Rate (L/min): 3 L/min  IO: Intake/output summary:  Intake/Output Summary (Last 24 hours) at 05/01/2021 1119 Last data filed at 04/30/2021 1851 Gross per 24 hour  Intake --  Output 400 ml  Net -400 ml    LBM: Last BM Date:  (uta) Baseline Weight: Weight: 72 kg Most recent weight: Weight: 72 kg     Palliative Assessment/Data:   PPS 30%  Time In:  10.20 Time Out:  11.20 Time Total:  60  Greater than 50%  of this time was spent counseling and coordinating care related to the above assessment and plan.  Signed by: Loistine Chance, MD   Please contact Palliative Medicine Team phone at [phone] for questions and concerns.  For individual provider: See Shea Evans

## 2021-05-01 NOTE — Progress Notes (Signed)
PROGRESS NOTE    William Buchanan  MWU:[id number] DOB: 1934/04/08 DOA: 04/26/2021 PCP: Leota Jacobsen, MD   Chief Complaint  Patient presents with   Altered Mental Status    Brief Narrative:  Patient 87 year old gentleman history of COPD with chronic hypoxemia on 2-1/2 L nasal cannula, interstitial lung disease, type 2 diabetes, recent PE on Eliquis, PVD status post left AKA, CVA, CAD status post stenting, hyperlipidemia, recent gangrenous cholecystitis status post biliary drain presented to the SNF due to shortness of breath and hypoxia.  On route via EMS patient required 6 L and received 15 L nonrebreather in the ED.  Patient noted to have a leukocytosis white count of 20, TSH obtained, BNP 118, troponin of 9.  Chest x-ray done with patchy right upper lobe consolidation which could represent pneumonia.  D-dimer elevated CT angiogram chest showed resolution of previous PE seen in July with interval development of left lower lobe.  Bronchovascular groundglass opacities suggestive of possible inflammation versus infection. -Patient placed on IV vancomycin, IV cefepime.  Patient noted to be hypothermic overnight and into this morning.   Assessment & Plan:   Principal Problem:   Acute on chronic respiratory failure with hypoxia (HCC) Active Problems:   Essential hypertension   Type 2 diabetes mellitus with diabetic polyneuropathy, without long-term current use of insulin (HCC)   S/P AKA (above knee amputation) unilateral, left (HCC)   History of cholecystitis   AKI (acute kidney injury) ([place])   PVD (peripheral vascular disease) (HCC)   CAD (coronary artery disease)   Pressure injury of skin   Hypothermia   Community acquired pneumonia  1 acute on chronic hypoxemic respiratory failure likely secondary to pneumonia, POA -Patient noted to have a baseline O2 requirements of 2.5 L however required 15 L on nonrebreather on presentation to the ED. -Noted to have difficult to obtain  peripheral pulse ox as patient with severe PAD. -VBG done was reassuring with normal pH, no hypercarbia, patient able to speak in full sentences with no use of accessory muscles of respiration. -Sputum gram stain and cultures pending. -Urine Legionella pending, urine strep pneumococcus negative.. -Patient with ongoing hypothermia and as such blood cultures ordered which are pending.  -CT chest findings with left lower lobe groundglass opacities noted concerning for infection versus inflammation. -Patient seen in consultation by PCCM who feel patient may have a component of bilateral lower lobe aspiration pneumonia, viral pneumonia possible and does not feel CT findings are highly suggestive of a ILD flare. -Patient initially was on IV vancomycin which was discontinued and patient subsequently kept on IV cefepime and IV azithromycin as well as Pulmicort, Flonase, scheduled duo nebs, PPI.   -Patient seen by PCCM who discussed with family and patient has been transitioned to full comfort measures.   -IV antibiotics have subsequently been discontinued.    2.  Hypothermia -??  Etiology. -May be secondary to acute infectious process secondary to problem #1. -Urinalysis done nitrite negative, leukocytes negative.  Urine strep pneumococcus antigen negative.   -Urine Legionella antigen negative. -Blood cultures pending with no growth to date..   -Procalcitonin negative.  -TSH noted at 10.568.  Free T4 within normal limits at 0.94. -Abnormal TSH likely secondary to euthyroid sick syndrome and will need repeat labs done in about 4 to 6 weeks in the outpatient setting. -Hypothermia improved.   -Patient transition to full comfort measures.   -IV antibiotics discontinued.   3.  Recent gangrenous cholecystitis status post biliary drain -Patient noted  to have been hospitalized from 7/26-8/10 for severe sepsis secondary to gangrenous cholecystitis status post percutaneous drain placement. -Patient noted to  have drain dislodged and was readmitted with sepsis due to possible peritonitis/cholecystitis with successful placement of biliary drain 03/31/2021. -Patient with some right upper quadrant abdominal tenderness to palpation. -Percutaneous drain with biliary drainage noted. -Right upper quadrant ultrasound with no acute findings, percutaneous biliary drain which is normally located by preceding CT. -CT abdomen and pelvis with no acute findings or abscess noted.  Stable biliary drain positioning. -Outpatient follow-up with IR.  4.  Acute kidney injury -Likely secondary to prerenal azotemia. -Blood pressure soft with some response to IV fluids.   -Renal function improving with hydration.  -Patient transitioned to full comfort measures.  5.  Abnormal TSH -TSH of 10.568. -Free T4 within normal limits at 0.94. -Likely euthyroid sick syndrome which was noted in April at [facility]. -Patient transitioned to full comfort measures.  6.  PVD -Status post AKA, second toe amputation on the right. -Risk factor modification. -Patient transition to full comfort measures and as such Eliquis Have been discontinued.   7.  History of recent PE -Patient transition to full comfort measures. -Eliquis discontinued. .  8.  Coronary artery disease status post stent placement -Patient transition to full comfort measures.  Eliquis and statin have been discontinued.   9.  Type 2 diabetes mellitus -Hemoglobin A1c 7.5 (04/02/2021). -Patient has been transition to full comfort measures.   -CBGs discontinued.  -Semglee and SSI discontinued.  10.  Irregular heart rhythm -EKG showed sinus rhythm with some PACs.   -Patient back in normal sinus rhythm.   -Patient has been transitioned to full comfort measures.  11.  Failure to thrive/multiple recent hospital admissions -Per PCCM they discussed with family patient has deteriorated over time and PCCM recommended palliative care consultation for  goals of care which has been ordered. -Continue DNR status. -PCCM discussed with family and decision was made to transition to full comfort measures.  12.  Borderline blood pressure -Patient noted to have some systolic blood pressures in the 90s. -Patient given the fluid bolus with some response. -Patient seen by PCCM will discuss with family and patient has been transitioned to full comfort measures.  13.  Pressure injury, POA Pressure Injury 04/28/21 Buttocks Right Stage 2 -  Partial thickness loss of dermis presenting as a shallow open injury with a red, pink wound bed without slough. (Active)  04/28/21 0200  Location: Buttocks  Location Orientation: Right  Staging: Stage 2 -  Partial thickness loss of dermis presenting as a shallow open injury with a red, pink wound bed without slough.  Wound Description (Comments):   Present on Admission: Yes         DVT prophylaxis: Comfort care Code Status: DNR Family Communication: No family at bedside. Disposition:   Status is: Inpatient  Remains inpatient appropriate because:Inpatient level of care appropriate due to severity of illness  Dispo: The patient is from: SNF              Anticipated d/c is to: Residential hospice home.              Patient currently is not medically stable to d/c.   Difficult to place patient No       Consultants:  PCCM: Dr. Carlis Abbott 04/28/2021 Palliative care: Dr. Rowe Pavy 05/01/2021  Procedures:  CT head 04/04/2021 CT angiogram chest 04/08/2021 CT abdomen and pelvis 04/28/2021 Chest x-ray 04/14/2021 Abdominal ultrasound  04/28/2021   Antimicrobials:  IV azithromycin 04/28/2021>>>> 04/29/2021 IV cefepime 04/15/2021>>>>> 04/29/2021. IV vancomycin 04/21/2021>>>> 04/28/2021   Subjective: Patient sedated just received some pain medications.  Resting comfortably.   Objective: Vitals:   04/29/21 2008 04/30/21 0411 04/30/21 0731 05/01/21 0630  BP:  104/63  128/75  Pulse:  84  89  Resp:  16  18  Temp:   97.6 F (36.4 C)  (!) 97.5 F (36.4 C)  TempSrc:  Oral  Oral  SpO2: 99% 96% 96%   Weight:      Height:        Intake/Output Summary (Last 24 hours) at 05/01/2021 1305 Last data filed at 04/30/2021 1851 Gross per 24 hour  Intake --  Output 400 ml  Net -400 ml    Filed Weights   04/13/2021 1906  Weight: 72 kg    Examination:  General exam: NAD. Respiratory system: CTA B anterior lung fields.  No wheezes, no crackles, no rhonchi.  Fair air movement.  No use of accessory muscles of respiration.    Cardiovascular system: RRR no murmurs rubs or gallops.  No JVD.  No lower extremity edema.  Gastrointestinal system: Abdomen is soft, nontender, nondistended, positive bowel sounds.  Drain in place with biliary drainage noted.  Central nervous system: Sedated.  Extremities: s/p L BKA. Skin: No rashes, lesions or ulcers Psychiatry: Judgement and insight unable to assess as patient sedated.. Mood & affect unable to assess    Data Reviewed: I have personally reviewed following labs and imaging studies  CBC: Recent Labs  Lab 04/25/2021 1907 04/28/21 0500 04/29/21 0239  WBC 20.2* 19.8* 13.1*  NEUTROABS  --   --  9.9*  HGB 12.7* 11.7* 10.1*  HCT 39.8 36.4* 31.6*  MCV 96.1 96.6 98.1  PLT 696* 519* 450*     Basic Metabolic Panel: Recent Labs  Lab 04/30/2021 1907 04/28/21 0500 04/28/21 0922 04/29/21 0239  NA 138 132*  --  133*  K 4.6 5.5* 5.2* 4.5  CL 107 106  --  110  CO2 21* 18*  --  19*  GLUCOSE 72 158*  --  457*  BUN 62* 53*  --  47*  CREATININE 1.54* 1.31*  --  1.18  CALCIUM 11.4* 10.5*  --  9.3  MG  --   --   --  1.8     GFR: Estimated Creatinine Clearance: 46.6 mL/min (by C-G formula based on SCr of 1.18 mg/dL).  Liver Function Tests: Recent Labs  Lab 04/24/2021 1907  AST 19  ALT 13  ALKPHOS 93  BILITOT 0.5  PROT 7.2  ALBUMIN 2.7*     CBG: Recent Labs  Lab 04/04/2021 1922 04/11/2021 2056 04/28/21 0310 04/29/21 0736 04/29/21 1154  GLUCAP 74 73 140*  175* 109*      Recent Results (from the past 240 hour(s))  Resp Panel by RT-PCR (Flu A&B, Covid) Nasopharyngeal Swab     Status: None   Collection Time: 04/23/2021  8:24 PM   Specimen: Nasopharyngeal Swab; Nasopharyngeal(NP) swabs in vial transport medium  Result Value Ref Range Status   SARS Coronavirus 2 by RT PCR NEGATIVE NEGATIVE Final    Comment: (NOTE) SARS-CoV-2 target nucleic acids are NOT DETECTED.  The SARS-CoV-2 RNA is generally detectable in upper respiratory specimens during the acute phase of infection. The lowest concentration of SARS-CoV-2 viral copies this assay can detect is 138 copies/mL. A negative result does not preclude SARS-Cov-2 infection and should not be used as the sole basis  for treatment or other patient management decisions. A negative result may occur with  improper specimen collection/handling, submission of specimen other than nasopharyngeal swab, presence of viral mutation(s) within the areas targeted by this assay, and inadequate number of viral copies(<138 copies/mL). A negative result must be combined with clinical observations, patient history, and epidemiological information. The expected result is Negative.  Fact Sheet for Patients:  EntrepreneurPulse.com.au  Fact Sheet for Healthcare Providers:  IncredibleEmployment.be  This test is no t yet approved or cleared by the [place] FDA and  has been authorized for detection and/or diagnosis of SARS-CoV-2 by FDA under an Emergency Use Authorization (EUA). This EUA will remain  in effect (meaning this test can be used) for the duration of the COVID-19 declaration under Section 564(b)(1) of the Act, 21 U.S.C.section 360bbb-3(b)(1), unless the authorization is terminated  or revoked sooner.       Influenza A by PCR NEGATIVE NEGATIVE Final   Influenza B by PCR NEGATIVE NEGATIVE Final    Comment: (NOTE) The Xpert Xpress SARS-CoV-2/FLU/RSV plus assay is  intended as an aid in the diagnosis of influenza from Nasopharyngeal swab specimens and should not be used as a sole basis for treatment. Nasal washings and aspirates are unacceptable for Xpert Xpress SARS-CoV-2/FLU/RSV testing.  Fact Sheet for Patients: EntrepreneurPulse.com.au  Fact Sheet for Healthcare Providers: IncredibleEmployment.be  This test is not yet approved or cleared by the [place] FDA and has been authorized for detection and/or diagnosis of SARS-CoV-2 by FDA under an Emergency Use Authorization (EUA). This EUA will remain in effect (meaning this test can be used) for the duration of the COVID-19 declaration under Section 564(b)(1) of the Act, 21 U.S.C. section 360bbb-3(b)(1), unless the authorization is terminated or revoked.  Performed at [facility], [place] [address], [place], [place] [address]   MRSA Next Gen by PCR, Nasal     Status: None   Collection Time: 04/28/21 12:29 AM   Specimen: Nasal Mucosa; Nasal Swab  Result Value Ref Range Status   MRSA by PCR Next Gen NOT DETECTED NOT DETECTED Final    Comment: (NOTE) The GeneXpert MRSA Assay (FDA approved for NASAL specimens only), is one component of a comprehensive MRSA colonization surveillance program. It is not intended to diagnose MRSA infection nor to guide or monitor treatment for MRSA infections. Test performance is not FDA approved in patients less than 51 years old. Performed at [facility], [place] [address] [place], [place] [address]   MRSA Next Gen by PCR, Nasal     Status: None   Collection Time: 04/28/21  9:09 AM   Specimen: Nasal Mucosa; Nasal Swab  Result Value Ref Range Status   MRSA by PCR Next Gen NOT DETECTED NOT DETECTED Final    Comment: (NOTE) The GeneXpert MRSA Assay (FDA approved for NASAL specimens only), is one component of a comprehensive MRSA colonization surveillance program. It is not intended to  diagnose MRSA infection nor to guide or monitor treatment for MRSA infections. Test performance is not FDA approved in patients less than 50 years old. Performed at [facility], [place] [address], [place], [place] [address]   Culture, blood (Routine X 2) w Reflex to ID Panel     Status: None (Preliminary result)   Collection Time: 04/28/21 12:25 PM   Specimen: BLOOD  Result Value Ref Range Status   Specimen Description   Final    BLOOD LEFT ANTECUBITAL Performed at [facility], [address] [place], [place] [address]    Special Requests   Final    BOTTLES DRAWN AEROBIC ONLY Blood Culture adequate volume Performed at [place] [address], [place], [place] [address]    Culture   Final    NO GROWTH 3 DAYS Performed at [facility], [place] [address] [place], [place] [address]    Report Status PENDING  Incomplete  Culture, blood (Routine X 2) w Reflex to ID Panel     Status: None (Preliminary result)   Collection Time: 04/28/21 12:25 PM   Specimen: BLOOD  Result Value Ref Range Status   Specimen Description   Final    BLOOD LEFT ANTECUBITAL Performed at [place] [address] [place], [place] [address]    Special Requests   Final    BOTTLES DRAWN AEROBIC ONLY Blood Culture adequate volume Performed at [place] [address] [place], [place] [address]    Culture   Final    NO GROWTH 3 DAYS Performed at [facility], [place] [address] [place], [place] [address]    Report Status PENDING  Incomplete  Urine Culture     Status: None   Collection Time: 04/28/21  5:03 PM   Specimen: Urine, Catheterized  Result Value Ref Range Status   Specimen Description   Final    URINE, CATHETERIZED Performed at [place] [address], [place], [place] [address]    Special Requests   Final    NONE Performed at [facility],  [place] [address], [place], [place] [address]    Culture   Final    NO GROWTH Performed at [facility], [place] [address] [place], [place] [address]    Report Status 04/29/2021 FINAL  Final          Radiology Studies: No results found.      Scheduled Meds:  (feeding supplement) PROSource Plus  30 mL Oral Daily   budesonide (PULMICORT) nebulizer solution  0.5 mg Nebulization BID   Chlorhexidine Gluconate Cloth  6 each Topical Daily   feeding supplement  1 Container Oral Q24H   feeding supplement  237 mL Oral Q24H   fluticasone  2 spray Each Nare Daily   gabapentin  100 mg Oral BID   loratadine  10 mg Oral Daily   mouth rinse  15 mL Mouth Rinse BID   mirtazapine  15 mg Oral QHS   pantoprazole sodium  40 mg Oral Daily   tamsulosin  0.4 mg Oral Daily   Continuous Infusions:  sodium chloride 10 mL/hr at 04/29/21 1300   chlorproMAZINE (THORAZINE) IV       LOS: 4 days    Time spent: 40 minutes    Irine Seal, MD [facility]   To contact the attending provider between 7A-7P or the covering provider during after hours 7P-7A, please log into the web site [email or web address] and access using universal [place] password for that web site. If you do not have the password, please call the hospital operator.  05/01/2021, 1:05 PM

## 2021-05-01 DEATH — deceased

## 2021-05-02 DIAGNOSIS — Z515 Encounter for palliative care: Secondary | ICD-10-CM | POA: Diagnosis not present

## 2021-05-02 DIAGNOSIS — N179 Acute kidney failure, unspecified: Secondary | ICD-10-CM | POA: Diagnosis not present

## 2021-05-02 DIAGNOSIS — J9621 Acute and chronic respiratory failure with hypoxia: Secondary | ICD-10-CM | POA: Diagnosis not present

## 2021-05-02 DIAGNOSIS — I2510 Atherosclerotic heart disease of native coronary artery without angina pectoris: Secondary | ICD-10-CM | POA: Diagnosis not present

## 2021-05-02 DIAGNOSIS — J189 Pneumonia, unspecified organism: Secondary | ICD-10-CM | POA: Diagnosis not present

## 2021-05-02 NOTE — TOC Progression Note (Addendum)
Transition of Care [facility]) - Progression Note    Patient Details  Name: William Buchanan MRN: [id number] Date of Birth: 1934/05/20  Transition [facility] CM/SW Contact  Leeroy Cha, RN Phone Number: 05/02/2021, 10:27 AM  Clinical Narrative:    [facility] referral given to answering service for either residental hospice or home hospice. Will have resp call back. [phone]-[id number] follow up on patient . No bed available today.  Expected Discharge Plan: [place] Barriers to Discharge: Continued Medical Work up  Expected Discharge Plan and Services Expected Discharge Plan: [place] In-house Referral: Hospice / Palliative Care Discharge Planning Services: CM Consult Post Acute Care Choice: Nursing Home Living arrangements for the past 2 months: [place] (LTC) Expected Discharge Date:  (unknown)                                     Social Determinants of Health (SDOH) Interventions    Readmission Risk Interventions No flowsheet data found.

## 2021-05-02 NOTE — Progress Notes (Signed)
Daily Progress Note   Patient Name: William Buchanan       Date: 05/02/2021 DOB: 1934/07/07  Age: 87 y.o. MRN#: [id number] Attending Physician: Eugenie Filler, MD Primary Care Physician: Leota Jacobsen, MD Admit Date: 04/19/2021  Reason for Consultation/Follow-up: Terminal Care  Subjective: Nonverbal, continues to appear frail, medication history noted.  Length of Stay: 5  Current Medications: Scheduled Meds:   (feeding supplement) PROSource Plus  30 mL Oral Daily   budesonide (PULMICORT) nebulizer solution  0.5 mg Nebulization BID   Chlorhexidine Gluconate Cloth  6 each Topical Daily   feeding supplement  1 Container Oral Q24H   feeding supplement  237 mL Oral Q24H   fluticasone  2 spray Each Nare Daily   gabapentin  100 mg Oral BID   loratadine  10 mg Oral Daily   mouth rinse  15 mL Mouth Rinse BID   mirtazapine  15 mg Oral QHS   pantoprazole sodium  40 mg Oral Daily   tamsulosin  0.4 mg Oral Daily    Continuous Infusions:  sodium chloride 10 mL/hr at 04/29/21 1300   chlorproMAZINE (THORAZINE) IV      PRN Meds: sodium chloride, acetaminophen, antiseptic oral rinse, bisacodyl, chlorproMAZINE (THORAZINE) IV, diphenhydrAMINE, glycopyrrolate **OR** glycopyrrolate **OR** glycopyrrolate, haloperidol **OR** haloperidol **OR** haloperidol lactate, HYDROmorphone (DILAUDID) injection, ipratropium-albuterol, LORazepam **OR** LORazepam **OR** LORazepam, magic mouthwash w/lidocaine, morphine, nystatin, ondansetron **OR** ondansetron (ZOFRAN) IV, polyvinyl alcohol  Physical Exam         Status post left BKA, generalized weakness, frailty Shallow regular breath sounds Not awake alert today Has biliary drain  Vital Signs: BP 107/83 (BP Location: Right Arm)   Pulse (!) 104   Temp  (!) 97.5 F (36.4 C) (Oral)   Resp 17   Ht 5\' 9"  (1.753 m)   Wt 72 kg   SpO2 94%   BMI 23.44 kg/m  SpO2: SpO2: 94 % O2 Device: O2 Device: Nasal Cannula O2 Flow Rate: O2 Flow Rate (L/min): 2 L/min  Intake/output summary:  Intake/Output Summary (Last 24 hours) at 05/02/2021 1223 Last data filed at 05/02/2021 0900 Gross per 24 hour  Intake 0 ml  Output --  Net 0 ml   LBM: Last BM Date:  (uta) Baseline Weight: Weight: 72 kg Most recent weight: Weight: 72 kg  Palliative Assessment/Data:      Patient Active Problem List   Diagnosis Date Noted   History of cholecystitis 04/28/2021   AKI (acute kidney injury) ([place]) 04/28/2021   PVD (peripheral vascular disease) ([place]) 04/28/2021   CAD (coronary artery disease) 04/28/2021   Pressure injury of skin 04/28/2021   Hypothermia 04/28/2021   Community acquired pneumonia    Acute on chronic respiratory failure with hypoxia ([place]) 04/12/2021   Pulmonary embolism ([place]) 04/02/2021   Abdominal pain 03/30/2021   Migration of percutaneous transhepatic biliary drain catheter 03/30/2021   S/P AKA (above knee amputation) unilateral, left ([place]) 03/30/2021   Fall from wheelchair 03/30/2021   Leukocytosis 03/30/2021   COPD (chronic obstructive pulmonary disease) ([place]) 03/30/2021   Hyperkalemia 03/30/2021   Critical lower limb ischemia ([place]) 06/07/2017   Severe protein-calorie malnutrition ([place]) 06/07/2017   Diverticulitis 02/18/2017   Arcus senilis, bilateral 01/10/2017   History of non-ST elevation myocardial infarction (NSTEMI) 12/26/2016   Bilateral carotid artery stenosis 08/25/2016   Benign neoplasm of colon 07/07/2015   Inflammatory polyarthritis ([place]) 07/07/2015   Type 2 diabetes mellitus with diabetic polyneuropathy, without long-term current use of insulin ([place]) 07/07/2015   Carotid atherosclerosis 07/07/2015   Local infection of skin and subcutaneous tissue 09/15/2014   Bunion 09/15/2014   Ulcer of heel and midfoot ([place])  09/15/2014   Essential hypertension 04/16/2014    Palliative Care Assessment & Plan   Patient Profile: Patient is 87 years old.  He has centrilobular emphysema, chronic hypoxic respiratory failure.  More recently he had gangrenous cholecystitis status postcholecystectomy complicated by bile leak, status post percutaneous biliary drain.  Patient on chart review noted to have right upper lobe cavitation known since April 2022, seen at [facility] health, prior CT chest with background emphysema and honeycombing. Patient admitted to the hospital at [facility] in [place], [place] for failure to thrive, severe protein calorie malnutrition, acute encephalopathy and recent hallucinations.  Patient also admitted for acute on chronic hypoxic respiratory failure in the setting of pneumonia and sepsis due to pneumonia.  Initially on pulmonary critical care medicine service and then transition to hospital medicine, started on comfort measures.  Palliative medicine consultation for terminal care as well as appropriate disposition options has been requested.  Assessment: Frailty, generalized weakness Mild generalized pain Possible dyspnea episodic.   Recommendations/Plan: IV Dilaudid on an as-needed basis for pain.  Patient requiring a dose every 3 hours or so. Medication history reviewed, continue current comfort measures, discussed with TOC colleague, appreciate help in initiating residential hospice consultation.  As per daughter's preference, it is for [facility] [place]. PPS 20%  Goals of Care and Additional Recommendations: Limitations on Scope of Treatment: Full Comfort Care  Code Status:    Code Status Orders  (From admission, onward)           Start     Ordered   04/29/21 1843  Do not attempt resuscitation (DNR)  Continuous       Question Answer Comment  In the event of cardiac or respiratory ARREST Do not call a "code blue"   In the event of cardiac or  respiratory ARREST Do not perform Intubation, CPR, defibrillation or ACLS   In the event of cardiac or respiratory ARREST Use medication by any route, position, wound care, and other measures to relive pain and suffering. May use oxygen, suction and manual treatment of airway obstruction as needed for comfort.      04/29/21 1846  Code Status History     Date Active Date Inactive Code Status Order ID Comments User Context   04/29/2021 1708 04/29/2021 1846 DNR [id number]  Julian Hy, DO Inpatient   04/12/2021 2339 04/29/2021 1708 DNR [id number]  Orene Desanctis, DO ED   03/30/2021 2130 04/08/2021 2016 Full Code [id number]  Chotiner, Yevonne Aline, MD ED       Prognosis:  < 2 weeks  Discharge Planning: [place] was discussed with RN.   Thank you for allowing the Palliative Medicine Team to assist in the care of this patient.   Time In: 11 Time Out: 11.25 Total Time 25  Prolonged Time Billed  no       Greater than 50%  of this time was spent counseling and coordinating care related to the above assessment and plan.  Loistine Chance, MD  Please contact Palliative Medicine Team phone at [phone] for questions and concerns.

## 2021-05-02 NOTE — Progress Notes (Signed)
PROGRESS NOTE    William Buchanan  NWG:[id number] DOB: [birth year] DOA: 04/12/2021 PCP: Leota Jacobsen, MD   Chief Complaint  Patient presents with   Altered Mental Status    Brief Narrative:  Patient 87 year old gentleman history of COPD with chronic hypoxemia on 2-1/2 L nasal cannula, interstitial lung disease, type 2 diabetes, recent PE on Eliquis, PVD status post left AKA, CVA, CAD status post stenting, hyperlipidemia, recent gangrenous cholecystitis status post biliary drain presented to the SNF due to shortness of breath and hypoxia.  On route via EMS patient required 6 L and received 15 L nonrebreather in the ED.  Patient noted to have a leukocytosis white count of 20, TSH obtained, BNP 118, troponin of 9.  Chest x-ray done with patchy right upper lobe consolidation which could represent pneumonia.  D-dimer elevated CT angiogram chest showed resolution of previous PE seen in July with interval development of left lower lobe.  Bronchovascular groundglass opacities suggestive of possible inflammation versus infection. -Patient placed on IV vancomycin, IV cefepime.  Patient noted to be hypothermic overnight and into this morning.   Assessment & Plan:   Principal Problem:   Acute on chronic respiratory failure with hypoxia (HCC) Active Problems:   Essential hypertension   Type 2 diabetes mellitus with diabetic polyneuropathy, without long-term current use of insulin (HCC)   S/P AKA (above knee amputation) unilateral, left (HCC)   History of cholecystitis   AKI (acute kidney injury) ([place])   PVD (peripheral vascular disease) (HCC)   CAD (coronary artery disease)   Pressure injury of skin   Hypothermia   Community acquired pneumonia  1 acute on chronic hypoxemic respiratory failure likely secondary to pneumonia, POA -Patient noted to have a baseline O2 requirements of 2.5 L however required 15 L on nonrebreather on presentation to the ED. -Noted to have difficult to obtain  peripheral pulse ox as patient with severe PAD. -VBG done was reassuring with normal pH, no hypercarbia, patient able to speak in full sentences with no use of accessory muscles of respiration. -Sputum gram stain and cultures pending. -Urine Legionella pending, urine strep pneumococcus negative.. -Patient with ongoing hypothermia and as such blood cultures ordered which are pending.  -CT chest findings with left lower lobe groundglass opacities noted concerning for infection versus inflammation. -Patient seen in consultation by PCCM who feel patient may have a component of bilateral lower lobe aspiration pneumonia, viral pneumonia possible and does not feel CT findings are highly suggestive of a ILD flare. -Patient initially was on IV vancomycin which was discontinued and patient subsequently kept on IV cefepime and IV azithromycin as well as Pulmicort, Flonase, scheduled duo nebs, PPI.   -Patient seen by PCCM who discussed with family and patient has been transitioned to full comfort measures.   -IV antibiotics have subsequently been discontinued.    2.  Hypothermia -??  Etiology. -May be secondary to acute infectious process secondary to problem #1. -Urinalysis done nitrite negative, leukocytes negative.  Urine strep pneumococcus antigen negative.   -Urine Legionella antigen negative. -Blood cultures pending with no growth to date..   -Procalcitonin negative.  -TSH noted at 10.568.  Free T4 within normal limits at 0.94. -Abnormal TSH likely secondary to euthyroid sick syndrome and will need repeat labs done in about 4 to 6 weeks in the outpatient setting. -Hypothermia improved.   -Patient transition to full comfort measures.   -IV antibiotics discontinued.   3.  Recent gangrenous cholecystitis status post biliary drain -Patient noted  to have been hospitalized from 7/26-8/10 for severe sepsis secondary to gangrenous cholecystitis status post percutaneous drain placement. -Patient noted to  have drain dislodged and was readmitted with sepsis due to possible peritonitis/cholecystitis with successful placement of biliary drain 03/31/2021. -Patient with some right upper quadrant abdominal tenderness to palpation. -Percutaneous drain with biliary drainage noted. -Right upper quadrant ultrasound with no acute findings, percutaneous biliary drain which is normally located by preceding CT. -CT abdomen and pelvis with no acute findings or abscess noted.  Stable biliary drain positioning. -Outpatient follow-up with IR.  4.  Acute kidney injury -Likely secondary to prerenal azotemia. -Blood pressure soft with some response to IV fluids.   -Renal function improving with hydration.  -Patient transitioned to full comfort measures.  5.  Abnormal TSH -TSH of 10.568. -Free T4 within normal limits at 0.94. -Likely euthyroid sick syndrome which was noted in April at [facility]. -Patient transitioned to full comfort measures.  6.  PVD -Status post AKA, second toe amputation on the right. -Risk factor modification. -Patient transitioned to full comfort measures and as such Eliquis Have been discontinued.   7.  History of recent PE -Patient transitioned to full comfort measures. -Eliquis discontinued. .  8.  Coronary artery disease status post stent placement -Patient transition to full comfort measures.  Eliquis and statin have been discontinued.   9.  Type 2 diabetes mellitus -Hemoglobin A1c 7.5 (04/02/2021). -Patient has been transition to full comfort measures.   -CBGs discontinued.  -Semglee and SSI discontinued.  10.  Irregular heart rhythm -EKG showed sinus rhythm with some PACs.   -Patient back in normal sinus rhythm.   -Patient has been transitioned to full comfort measures.  11.  Failure to thrive/multiple recent hospital admissions -Per PCCM they discussed with family patient has deteriorated over time and PCCM recommended palliative care consultation  for goals of care which has been ordered. -Continue DNR status. -PCCM discussed with family and decision was made to transition to full comfort measures. -Palliative care following.  12.  Borderline blood pressure -Patient noted to have some systolic blood pressures in the 90s. -Patient given the fluid bolus with some response. -Patient seen by PCCM will discuss with family and patient has been transitioned to full comfort measures.  13.  Pressure injury, POA Pressure Injury 04/28/21 Buttocks Right Stage 2 -  Partial thickness loss of dermis presenting as a shallow open injury with a red, pink wound bed without slough. (Active)  04/28/21 0200  Location: Buttocks  Location Orientation: Right  Staging: Stage 2 -  Partial thickness loss of dermis presenting as a shallow open injury with a red, pink wound bed without slough.  Wound Description (Comments):   Present on Admission: Yes         DVT prophylaxis: Comfort care Code Status: DNR Family Communication: No family at bedside. Disposition:   Status is: Inpatient  Remains inpatient appropriate because:Inpatient level of care appropriate due to severity of illness  Dispo: The patient is from: SNF              Anticipated d/c is to: Residential hospice home.              Patient currently is not medically stable to d/c.   Difficult to place patient No       Consultants:  PCCM: Dr. Carlis Abbott 04/28/2021 Palliative care: Dr. Rowe Pavy 05/01/2021  Procedures:  CT head 04/10/2021 CT angiogram chest 04/16/2021 CT abdomen and pelvis 04/28/2021 Chest x-ray  04/03/2021 Abdominal ultrasound 04/28/2021   Antimicrobials:  IV azithromycin 04/28/2021>>>> 04/29/2021 IV cefepime 04/20/2021>>>>> 04/29/2021. IV vancomycin 04/13/2021>>>> 04/28/2021   Subjective: Sedated.  Resting comfortably.    Objective: Vitals:   05/01/21 0630 05/01/21 2127 05/02/21 0509 05/02/21 0915  BP: 128/75  107/83   Pulse: 89  (!) 104   Resp: 18  17   Temp: (!) 97.5  F (36.4 C)  (!) 97.5 F (36.4 C)   TempSrc: Oral  Oral   SpO2:  (!) 82% (!) 79% 94%  Weight:      Height:        Intake/Output Summary (Last 24 hours) at 05/02/2021 1313 Last data filed at 05/02/2021 0900 Gross per 24 hour  Intake 0 ml  Output --  Net 0 ml    Filed Weights   04/19/2021 1906  Weight: 72 kg    Examination:  General exam: NAD. Respiratory system: CTA B anterior lung fields.  No wheezes, no crackles, no rhonchi.  Fair air movement.  No use of accessory muscles of respiration.  Cardiovascular system: Regular rate rhythm no murmurs rubs or gallops.  No JVD.  No lower extremity edema.  Gastrointestinal system: Abdomen is soft, nontender, nondistended, positive bowel sounds.  Drain in place with biliary drainage noted.  Central nervous system: Sedated.  Extremities: s/p L BKA. Skin: No rashes, lesions or ulcers Psychiatry: Judgement and insight unable to assess as patient sedated.. Mood & affect unable to assess    Data Reviewed: I have personally reviewed following labs and imaging studies  CBC: Recent Labs  Lab 04/03/2021 1907 04/28/21 0500 04/29/21 0239  WBC 20.2* 19.8* 13.1*  NEUTROABS  --   --  9.9*  HGB 12.7* 11.7* 10.1*  HCT 39.8 36.4* 31.6*  MCV 96.1 96.6 98.1  PLT 696* 519* 450*     Basic Metabolic Panel: Recent Labs  Lab 04/11/2021 1907 04/28/21 0500 04/28/21 0922 04/29/21 0239  NA 138 132*  --  133*  K 4.6 5.5* 5.2* 4.5  CL 107 106  --  110  CO2 21* 18*  --  19*  GLUCOSE 72 158*  --  457*  BUN 62* 53*  --  47*  CREATININE 1.54* 1.31*  --  1.18  CALCIUM 11.4* 10.5*  --  9.3  MG  --   --   --  1.8     GFR: Estimated Creatinine Clearance: 46.6 mL/min (by C-G formula based on SCr of 1.18 mg/dL).  Liver Function Tests: Recent Labs  Lab 04/25/2021 1907  AST 19  ALT 13  ALKPHOS 93  BILITOT 0.5  PROT 7.2  ALBUMIN 2.7*     CBG: Recent Labs  Lab 04/25/2021 1922 04/12/2021 2056 04/28/21 0310 04/29/21 0736 04/29/21 1154  GLUCAP  74 73 140* 175* 109*      Recent Results (from the past 240 hour(s))  Resp Panel by RT-PCR (Flu A&B, Covid) Nasopharyngeal Swab     Status: None   Collection Time: 04/13/2021  8:24 PM   Specimen: Nasopharyngeal Swab; Nasopharyngeal(NP) swabs in vial transport medium  Result Value Ref Range Status   SARS Coronavirus 2 by RT PCR NEGATIVE NEGATIVE Final    Comment: (NOTE) SARS-CoV-2 target nucleic acids are NOT DETECTED.  The SARS-CoV-2 RNA is generally detectable in upper respiratory specimens during the acute phase of infection. The lowest concentration of SARS-CoV-2 viral copies this assay can detect is 138 copies/mL. A negative result does not preclude SARS-Cov-2 infection and should not be used as the  sole basis for treatment or other patient management decisions. A negative result may occur with  improper specimen collection/handling, submission of specimen other than nasopharyngeal swab, presence of viral mutation(s) within the areas targeted by this assay, and inadequate number of viral copies(<138 copies/mL). A negative result must be combined with clinical observations, patient history, and epidemiological information. The expected result is Negative.  Fact Sheet for Patients:  EntrepreneurPulse.com.au  Fact Sheet for Healthcare Providers:  IncredibleEmployment.be  This test is no t yet approved or cleared by the [place] FDA and  has been authorized for detection and/or diagnosis of SARS-CoV-2 by FDA under an Emergency Use Authorization (EUA). This EUA will remain  in effect (meaning this test can be used) for the duration of the COVID-19 declaration under Section 564(b)(1) of the Act, 21 U.S.C.section 360bbb-3(b)(1), unless the authorization is terminated  or revoked sooner.       Influenza A by PCR NEGATIVE NEGATIVE Final   Influenza B by PCR NEGATIVE NEGATIVE Final    Comment: (NOTE) The Xpert Xpress SARS-CoV-2/FLU/RSV  plus assay is intended as an aid in the diagnosis of influenza from Nasopharyngeal swab specimens and should not be used as a sole basis for treatment. Nasal washings and aspirates are unacceptable for Xpert Xpress SARS-CoV-2/FLU/RSV testing.  Fact Sheet for Patients: EntrepreneurPulse.com.au  Fact Sheet for Healthcare Providers: IncredibleEmployment.be  This test is not yet approved or cleared by the [place] FDA and has been authorized for detection and/or diagnosis of SARS-CoV-2 by FDA under an Emergency Use Authorization (EUA). This EUA will remain in effect (meaning this test can be used) for the duration of the COVID-19 declaration under Section 564(b)(1) of the Act, 21 U.S.C. section 360bbb-3(b)(1), unless the authorization is terminated or revoked.  Performed at [facility], [place] [address], [place], [place] [address]   MRSA Next Gen by PCR, Nasal     Status: None   Collection Time: 04/28/21 12:29 AM   Specimen: Nasal Mucosa; Nasal Swab  Result Value Ref Range Status   MRSA by PCR Next Gen NOT DETECTED NOT DETECTED Final    Comment: (NOTE) The GeneXpert MRSA Assay (FDA approved for NASAL specimens only), is one component of a comprehensive MRSA colonization surveillance program. It is not intended to diagnose MRSA infection nor to guide or monitor treatment for MRSA infections. Test performance is not FDA approved in patients less than 31 years old. Performed at [facility], [place] [address], [place], [place] [address]   MRSA Next Gen by PCR, Nasal     Status: None   Collection Time: 04/28/21  9:09 AM   Specimen: Nasal Mucosa; Nasal Swab  Result Value Ref Range Status   MRSA by PCR Next Gen NOT DETECTED NOT DETECTED Final    Comment: (NOTE) The GeneXpert MRSA Assay (FDA approved for NASAL specimens only), is one component of a comprehensive MRSA colonization surveillance program. It is  not intended to diagnose MRSA infection nor to guide or monitor treatment for MRSA infections. Test performance is not FDA approved in patients less than 43 years old. Performed at [facility], [place] [address], [place], [place] [address]   Culture, blood (Routine X 2) w Reflex to ID Panel     Status: None (Preliminary result)   Collection Time: 04/28/21 12:25 PM   Specimen: BLOOD  Result Value Ref Range Status   Specimen Description   Final    BLOOD LEFT ANTECUBITAL Performed at [facility], [place] [address], [place], [place] [address]    Special Requests   Final    BOTTLES DRAWN AEROBIC ONLY Blood Culture adequate volume Performed at [place] [address] [place], [place] [address]    Culture   Final    NO GROWTH 4 DAYS Performed at [facility], [place] [address], [place], [place] [address]    Report Status PENDING  Incomplete  Culture, blood (Routine X 2) w Reflex to ID Panel     Status: None (Preliminary result)   Collection Time: 04/28/21 12:25 PM   Specimen: BLOOD  Result Value Ref Range Status   Specimen Description   Final    BLOOD LEFT ANTECUBITAL Performed at [place] [address], [place], [place] [address]    Special Requests   Final    BOTTLES DRAWN AEROBIC ONLY Blood Culture adequate volume Performed at [place] [address], [place], [place] [address]    Culture   Final    NO GROWTH 4 DAYS Performed at [facility], [place] [address] [place], [place] [address]    Report Status PENDING  Incomplete  Urine Culture     Status: None   Collection Time: 04/28/21  5:03 PM   Specimen: Urine, Catheterized  Result Value Ref Range Status   Specimen Description   Final    URINE, CATHETERIZED Performed at [place] [address] [place], [place] [address]    Special Requests   Final    NONE Performed at [facility], [place] [address] [place], [place] [address]    Culture   Final    NO GROWTH Performed at [facility], [place] [address] [place], [place] [address]    Report Status 04/29/2021 FINAL  Final          Radiology Studies: No results found.      Scheduled Meds:  (feeding supplement) PROSource Plus  30 mL Oral Daily   budesonide (PULMICORT) nebulizer solution  0.5 mg Nebulization BID   Chlorhexidine Gluconate Cloth  6 each Topical Daily   feeding supplement  1 Container Oral Q24H   feeding supplement  237 mL Oral Q24H   fluticasone  2 spray Each Nare Daily   gabapentin  100 mg Oral BID   loratadine  10 mg Oral Daily   mouth rinse  15 mL Mouth Rinse BID   mirtazapine  15 mg Oral QHS   pantoprazole sodium  40 mg Oral Daily   tamsulosin  0.4 mg Oral Daily   Continuous Infusions:  sodium chloride 10 mL/hr at 04/29/21 1300   chlorproMAZINE (THORAZINE) IV       LOS: 5 days    Time spent: 40 minutes    Irine Seal, MD [facility]   To contact the attending provider between 7A-7P or the covering provider during after hours 7P-7A, please log into the web site [email or web address] and access using universal [place] password for that web site. If you do not have the password, please call the hospital operator.  05/02/2021, 1:13 PM

## 2021-05-03 DIAGNOSIS — Z515 Encounter for palliative care: Secondary | ICD-10-CM | POA: Diagnosis not present

## 2021-05-03 DIAGNOSIS — N179 Acute kidney failure, unspecified: Secondary | ICD-10-CM | POA: Diagnosis not present

## 2021-05-03 DIAGNOSIS — J9621 Acute and chronic respiratory failure with hypoxia: Secondary | ICD-10-CM | POA: Diagnosis not present

## 2021-05-03 DIAGNOSIS — I2510 Atherosclerotic heart disease of native coronary artery without angina pectoris: Secondary | ICD-10-CM | POA: Diagnosis not present

## 2021-05-03 DIAGNOSIS — J189 Pneumonia, unspecified organism: Secondary | ICD-10-CM | POA: Diagnosis not present

## 2021-05-03 LAB — CULTURE, BLOOD (ROUTINE X 2)
Culture: NO GROWTH
Culture: NO GROWTH
Special Requests: ADEQUATE
Special Requests: ADEQUATE

## 2021-05-03 MED ORDER — HYDROMORPHONE HCL 1 MG/ML IJ SOLN
1.0000 mg | INTRAMUSCULAR | Status: DC | PRN
  Administered 2021-05-03: 1 mg via INTRAVENOUS
  Filled 2021-05-03: qty 1

## 2021-05-03 MED ORDER — SODIUM CHLORIDE 0.9 % IV SOLN
0.5000 mg/h | INTRAVENOUS | Status: DC
  Administered 2021-05-03 – 2021-05-04 (×2): 0.5 mg/h via INTRAVENOUS
  Filled 2021-05-03 (×3): qty 2.5

## 2021-05-03 NOTE — Progress Notes (Signed)
PT Cancellation Note  Patient Details Name: William Buchanan MRN: [id number] DOB: May 28, 1934   Cancelled Treatment:    Reason Eval/Treat Not Completed: Other (comment) (Patient seen by palliative care, transitioned to full comfort measures, acute therapy services will sign off at this time.)   Verner Mould, DPT Acute Rehabilitation Services Office [phone] Pager [phone]

## 2021-05-03 NOTE — TOC Progression Note (Signed)
Transition of Care [facility]) - Progression Note    Patient Details  Name: William Buchanan MRN: [id number] Date of Birth: 01/10/1934  Transition [facility]) CM/SW Contact  Harvir Patry, Marjie Skiff, RN Phone Number: 05/03/2021, 10:48 AM  Clinical Narrative:    Per [facility] liaison there are no open residential beds at either of their facilities today. She will let daughter know. TOC will continue to follow.   Expected Discharge Plan: [place] Barriers to Discharge: Continued Medical Work up  Expected Discharge Plan and Services Expected Discharge Plan: [place] In-house Referral: Hospice / Palliative Care Discharge Planning Services: CM Consult Post Acute Care Choice: Nursing Home Living arrangements for the past 2 months: [place] (LTC) Expected Discharge Date:  (unknown)                                     Social Determinants of Health (SDOH) Interventions    Readmission Risk Interventions No flowsheet data found.

## 2021-05-03 NOTE — Progress Notes (Signed)
Daily Progress Note   Patient Name: William Buchanan       Date: 05/03/2021 DOB: 02-23-1934  Age: 87 y.o. MRN#: [id number] Attending Physician: Eugenie Filler, MD Primary Care Physician: Leota Jacobsen, MD Admit Date: 04/22/2021  Reason for Consultation/Follow-up: Terminal Care  Subjective: Nonverbal, continues to appear frail, medication history noted. Agitated at times, in mild distress.   Length of Stay: 6  Current Medications: Scheduled Meds:   (feeding supplement) PROSource Plus  30 mL Oral Daily   budesonide (PULMICORT) nebulizer solution  0.5 mg Nebulization BID   Chlorhexidine Gluconate Cloth  6 each Topical Daily   feeding supplement  1 Container Oral Q24H   feeding supplement  237 mL Oral Q24H   fluticasone  2 spray Each Nare Daily   mouth rinse  15 mL Mouth Rinse BID   tamsulosin  0.4 mg Oral Daily    Continuous Infusions:  sodium chloride 10 mL/hr at 04/29/21 1300   chlorproMAZINE (THORAZINE) IV      PRN Meds: sodium chloride, acetaminophen, antiseptic oral rinse, bisacodyl, chlorproMAZINE (THORAZINE) IV, diphenhydrAMINE, glycopyrrolate **OR** glycopyrrolate **OR** glycopyrrolate, haloperidol **OR** haloperidol **OR** haloperidol lactate, HYDROmorphone (DILAUDID) injection, ipratropium-albuterol, LORazepam **OR** LORazepam **OR** LORazepam, magic mouthwash w/lidocaine, nystatin, ondansetron **OR** ondansetron (ZOFRAN) IV, polyvinyl alcohol  Physical Exam         Status post left BKA, generalized weakness, frailty Shallow regular breath sounds Not awake alert  In mild to moderate distress.  Has biliary drain  Vital Signs: BP 129/69 (BP Location: Right Arm)   Pulse 72   Temp 98 F (36.7 C) (Axillary)   Resp 17   Ht 5\' 9"  (1.753 m)   Wt 72 kg   SpO2 92%    BMI 23.44 kg/m  SpO2: SpO2: 92 % O2 Device: O2 Device: Nasal Cannula O2 Flow Rate: O2 Flow Rate (L/min): 2 L/min  Intake/output summary: No intake or output data in the 24 hours ending 05/03/21 1122  LBM: Last BM Date:  (uta) Baseline Weight: Weight: 72 kg Most recent weight: Weight: 72 kg       Palliative Assessment/Data:      Patient Active Problem List   Diagnosis Date Noted   History of cholecystitis 04/28/2021   AKI (acute kidney injury) ([place]) 04/28/2021   PVD (peripheral vascular disease) ([place]) 04/28/2021  CAD (coronary artery disease) 04/28/2021   Pressure injury of skin 04/28/2021   Hypothermia 04/28/2021   Community acquired pneumonia    Acute on chronic respiratory failure with hypoxia ([place]) 04/02/2021   Pulmonary embolism ([place]) 04/02/2021   Abdominal pain 03/30/2021   Migration of percutaneous transhepatic biliary drain catheter 03/30/2021   S/P AKA (above knee amputation) unilateral, left ([place]) 03/30/2021   Fall from wheelchair 03/30/2021   Leukocytosis 03/30/2021   COPD (chronic obstructive pulmonary disease) ([place]) 03/30/2021   Hyperkalemia 03/30/2021   Critical lower limb ischemia ([place]) 06/07/2017   Severe protein-calorie malnutrition ([place]) 06/07/2017   Diverticulitis 02/18/2017   Arcus senilis, bilateral 01/10/2017   History of non-ST elevation myocardial infarction (NSTEMI) 12/26/2016   Bilateral carotid artery stenosis 08/25/2016   Benign neoplasm of colon 07/07/2015   Inflammatory polyarthritis ([place]) 07/07/2015   Type 2 diabetes mellitus with diabetic polyneuropathy, without long-term current use of insulin ([place]) 07/07/2015   Carotid atherosclerosis 07/07/2015   Local infection of skin and subcutaneous tissue 09/15/2014   Bunion 09/15/2014   Ulcer of heel and midfoot ([place]) 09/15/2014   Essential hypertension 04/16/2014    Palliative Care Assessment & Plan   Patient Profile: Patient is 87 years old.  He has centrilobular emphysema, chronic  hypoxic respiratory failure.  More recently he had gangrenous cholecystitis status postcholecystectomy complicated by bile leak, status post percutaneous biliary drain.  Patient on chart review noted to have right upper lobe cavitation known since April 2022, seen at [facility] health, prior CT chest with background emphysema and honeycombing. Patient admitted to the hospital at [facility] in [place], [place] for failure to thrive, severe protein calorie malnutrition, acute encephalopathy and recent hallucinations.  Patient also admitted for acute on chronic hypoxic respiratory failure in the setting of pneumonia and sepsis due to pneumonia.  Initially on pulmonary critical care medicine service and then transition to hospital medicine, started on comfort measures.  Palliative medicine consultation for terminal care as well as appropriate disposition options has been requested.  Assessment: Frailty, generalized weakness Mild generalized pain Possible dyspnea episodic.   Delirium, possibly terminal.   Recommendations/Plan: IV Dilaudid: will start drip and boluses for comfort care.  Transfer to hospice once there is a bed.  D/C medications no longer contributing to comfort: will d/c remeron, gabapentin and protonix.  Prognosis hours to some very limited number of days in my opinion.  PPS 20%  Goals of Care and Additional Recommendations: Limitations on Scope of Treatment: Full Comfort Care  Code Status:    Code Status Orders  (From admission, onward)           Start     Ordered   04/29/21 1843  Do not attempt resuscitation (DNR)  Continuous       Question Answer Comment  In the event of cardiac or respiratory ARREST Do not call a "code blue"   In the event of cardiac or respiratory ARREST Do not perform Intubation, CPR, defibrillation or ACLS   In the event of cardiac or respiratory ARREST Use medication by any route, position, wound care, and other measures to  relive pain and suffering. May use oxygen, suction and manual treatment of airway obstruction as needed for comfort.      04/29/21 1846           Code Status History     Date Active Date Inactive Code Status Order ID Comments User Context   04/29/2021 1708 04/29/2021 1846 DNR [id number]  Carlis Abbott,  Venita Sheffield, DO Inpatient   04/03/2021 2339 04/29/2021 1708 DNR [id number]  Orene Desanctis, DO ED   03/30/2021 2130 04/08/2021 2016 Full Code [id number]  Chotiner, Yevonne Aline, MD ED       Prognosis:  Hours to days.   Discharge Planning: Hospice facility  Care plan was discussed with [facility] MD, patient.   Thank you for allowing the Palliative Medicine Team to assist in the care of this patient.   Time In: 11 Time Out: 11.25 Total Time 25  Prolonged Time Billed  no       Greater than 50%  of this time was spent counseling and coordinating care related to the above assessment and plan.  Loistine Chance, MD  Please contact Palliative Medicine Team phone at [phone] for questions and concerns.

## 2021-05-03 NOTE — Progress Notes (Signed)
PROGRESS NOTE    William Buchanan  CHE:[id number] DOB: [birth year] DOA: 04/11/2021 PCP: Leota Jacobsen, MD   Chief Complaint  Patient presents with   Altered Mental Status    Brief Narrative:  Patient 87 year old gentleman history of COPD with chronic hypoxemia on 2-1/2 L nasal cannula, interstitial lung disease, type 2 diabetes, recent PE on Eliquis, PVD status post left AKA, CVA, CAD status post stenting, hyperlipidemia, recent gangrenous cholecystitis status post biliary drain presented to the SNF due to shortness of breath and hypoxia.  On route via EMS patient required 6 L and received 15 L nonrebreather in the ED.  Patient noted to have a leukocytosis white count of 20, TSH obtained, BNP 118, troponin of 9.  Chest x-ray done with patchy right upper lobe consolidation which could represent pneumonia.  D-dimer elevated CT angiogram chest showed resolution of previous PE seen in July with interval development of left lower lobe.  Bronchovascular groundglass opacities suggestive of possible inflammation versus infection. -Patient placed on IV vancomycin, IV cefepime.  Patient noted to be hypothermic overnight and into this morning.   Assessment & Plan:   Principal Problem:   Acute on chronic respiratory failure with hypoxia (HCC) Active Problems:   Essential hypertension   Type 2 diabetes mellitus with diabetic polyneuropathy, without long-term current use of insulin (HCC)   S/P AKA (above knee amputation) unilateral, left (HCC)   History of cholecystitis   AKI (acute kidney injury) ([place])   PVD (peripheral vascular disease) (HCC)   CAD (coronary artery disease)   Pressure injury of skin   Hypothermia   Community acquired pneumonia  1 acute on chronic hypoxemic respiratory failure likely secondary to pneumonia, POA -Patient noted to have a baseline O2 requirements of 2.5 L however required 15 L on nonrebreather on presentation to the ED. -Noted to have difficult to obtain  peripheral pulse ox as patient with severe PAD. -VBG done was reassuring with normal pH, no hypercarbia, patient able to speak in full sentences with no use of accessory muscles of respiration. -Sputum gram stain and cultures pending. -Urine Legionella pending, urine strep pneumococcus negative.. -Patient with ongoing hypothermia and as such blood cultures ordered which are pending.  -CT chest findings with left lower lobe groundglass opacities noted concerning for infection versus inflammation. -Patient seen in consultation by PCCM who feel patient may have a component of bilateral lower lobe aspiration pneumonia, viral pneumonia possible and does not feel CT findings are highly suggestive of a ILD flare. -Patient initially was on IV vancomycin which was discontinued and patient subsequently kept on IV cefepime and IV azithromycin as well as Pulmicort, Flonase, scheduled duo nebs, PPI.   -Patient seen by PCCM who discussed with family and patient has been transitioned to full comfort measures.   -IV antibiotics have subsequently been discontinued.    2.  Hypothermia -??  Etiology. -May be secondary to acute infectious process secondary to problem #1. -Urinalysis done nitrite negative, leukocytes negative.  Urine strep pneumococcus antigen negative.   -Urine Legionella antigen negative. -Blood cultures pending with no growth to date..   -Procalcitonin negative.  -TSH noted at 10.568.  Free T4 within normal limits at 0.94. -Abnormal TSH likely secondary to euthyroid sick syndrome and will need repeat labs done in about 4 to 6 weeks in the outpatient setting. -Hypothermia improved.   -Patient transition to full comfort measures.   -IV antibiotics discontinued.   3.  Recent gangrenous cholecystitis status post biliary drain -Patient noted  to have been hospitalized from 7/26-8/10 for severe sepsis secondary to gangrenous cholecystitis status post percutaneous drain placement. -Patient noted to  have drain dislodged and was readmitted with sepsis due to possible peritonitis/cholecystitis with successful placement of biliary drain 03/31/2021. -Patient with some right upper quadrant abdominal tenderness to palpation. -Percutaneous drain with biliary drainage noted. -Right upper quadrant ultrasound with no acute findings, percutaneous biliary drain which is normally located by preceding CT. -CT abdomen and pelvis with no acute findings or abscess noted.  Stable biliary drain positioning. -Outpatient follow-up with IR.  4.  Acute kidney injury -Likely secondary to prerenal azotemia. -Blood pressure soft with some response to IV fluids.   -Renal function improving with hydration.  -Patient transitioned to full comfort measures.  5.  Abnormal TSH -TSH of 10.568. -Free T4 within normal limits at 0.94. -Likely euthyroid sick syndrome which was noted in April at [facility]. -Patient transitioned to full comfort measures.  6.  PVD -Status post AKA, second toe amputation on the right. -Risk factor modification. -Patient transitioned to full comfort measures and as such Eliquis Have been discontinued.   7.  History of recent PE -Patient transitioned to full comfort measures. -Eliquis discontinued. .  8.  Coronary artery disease status post stent placement -Patient transition to full comfort measures.  Eliquis and statin have been discontinued.   9.  Type 2 diabetes mellitus -Hemoglobin A1c 7.5 (04/02/2021). -Patient has been transition to full comfort measures.   -CBGs discontinued.  -Semglee and SSI discontinued.  10.  Irregular heart rhythm -EKG showed sinus rhythm with some PACs.   -Patient back in normal sinus rhythm.   -Patient has been transitioned to full comfort measures.  11.  Failure to thrive/multiple recent hospital admissions -Per PCCM they discussed with family patient has deteriorated over time and PCCM recommended palliative care consultation  for goals of care which has been ordered. -Continue DNR status. -PCCM discussed with family and decision was made to transition to full comfort measures. -Palliative care following. -Awaiting residential hospice home placement. -Patient to be started on a Dilaudid drip as per palliative care patient now with possible terminal delirium.  12.  Borderline blood pressure -Patient noted to have some systolic blood pressures in the 90s. -Patient given the fluid bolus with some response. -Patient seen by PCCM will discuss with family and patient has been transitioned to full comfort measures.  13.  Pressure injury, POA Pressure Injury 04/28/21 Buttocks Right Stage 2 -  Partial thickness loss of dermis presenting as a shallow open injury with a red, pink wound bed without slough. (Active)  04/28/21 0200  Location: Buttocks  Location Orientation: Right  Staging: Stage 2 -  Partial thickness loss of dermis presenting as a shallow open injury with a red, pink wound bed without slough.  Wound Description (Comments):   Present on Admission: Yes         DVT prophylaxis: Comfort care Code Status: DNR Family Communication: No family at bedside. Disposition:   Status is: Inpatient  Remains inpatient appropriate because:Inpatient level of care appropriate due to severity of illness  Dispo: The patient is from: SNF              Anticipated d/c is to: Residential hospice home.              Patient currently is not medically stable to d/c.   Difficult to place patient No       Consultants:  PCCM: Dr. Carlis Abbott  04/28/2021 Palliative care: Dr. Rowe Pavy 05/01/2021  Procedures:  CT head 04/10/2021 CT angiogram chest 04/21/2021 CT abdomen and pelvis 04/28/2021 Chest x-ray 04/26/2021 Abdominal ultrasound 04/28/2021   Antimicrobials:  IV azithromycin 04/28/2021>>>> 04/29/2021 IV cefepime 04/06/2021>>>>> 04/29/2021. IV vancomycin 04/20/2021>>>> 04/28/2021   Subjective: Sedated.  Minimally responsive.   Appears comfortable.    Objective: Vitals:   05/01/21 2127 05/02/21 0509 05/02/21 0915 05/03/21 0500  BP:  107/83  129/69  Pulse:  (!) 104  72  Resp:  17  17  Temp:  (!) 97.5 F (36.4 C)  98 F (36.7 C)  TempSrc:  Oral  Axillary  SpO2: (!) 82% (!) 79% 94% 92%  Weight:      Height:       No intake or output data in the 24 hours ending 05/03/21 1244  Filed Weights   04/15/2021 1906  Weight: 72 kg    Examination:  General exam: NAD.  Minimally responsive. Respiratory system: CTA B anterior lung fields.  No wheezes, no crackles, no rhonchi.  Fair air movement.  No use of accessory muscles of respiration.   Cardiovascular system: RRR no murmurs rubs or gallops.  No JVD.  No lower extremity edema.   Gastrointestinal system: Abdomen soft, nontender, nondistended, positive bowel sounds.  Drain in place with biliary drainage noted.  Central nervous system: Sedated.  Extremities: s/p L BKA. Skin: No rashes, lesions or ulcers Psychiatry: Judgement and insight unable to assess as patient sedated.. Mood & affect unable to assess    Data Reviewed: I have personally reviewed following labs and imaging studies  CBC: Recent Labs  Lab 04/21/2021 1907 04/28/21 0500 04/29/21 0239  WBC 20.2* 19.8* 13.1*  NEUTROABS  --   --  9.9*  HGB 12.7* 11.7* 10.1*  HCT 39.8 36.4* 31.6*  MCV 96.1 96.6 98.1  PLT 696* 519* 450*     Basic Metabolic Panel: Recent Labs  Lab 04/26/2021 1907 04/28/21 0500 04/28/21 0922 04/29/21 0239  NA 138 132*  --  133*  K 4.6 5.5* 5.2* 4.5  CL 107 106  --  110  CO2 21* 18*  --  19*  GLUCOSE 72 158*  --  457*  BUN 62* 53*  --  47*  CREATININE 1.54* 1.31*  --  1.18  CALCIUM 11.4* 10.5*  --  9.3  MG  --   --   --  1.8     GFR: Estimated Creatinine Clearance: 46.6 mL/min (by C-G formula based on SCr of 1.18 mg/dL).  Liver Function Tests: Recent Labs  Lab 04/22/2021 1907  AST 19  ALT 13  ALKPHOS 93  BILITOT 0.5  PROT 7.2  ALBUMIN 2.7*      CBG: Recent Labs  Lab 04/18/2021 1922 04/20/2021 2056 04/28/21 0310 04/29/21 0736 04/29/21 1154  GLUCAP 74 73 140* 175* 109*      Recent Results (from the past 240 hour(s))  Resp Panel by RT-PCR (Flu A&B, Covid) Nasopharyngeal Swab     Status: None   Collection Time: 04/25/2021  8:24 PM   Specimen: Nasopharyngeal Swab; Nasopharyngeal(NP) swabs in vial transport medium  Result Value Ref Range Status   SARS Coronavirus 2 by RT PCR NEGATIVE NEGATIVE Final    Comment: (NOTE) SARS-CoV-2 target nucleic acids are NOT DETECTED.  The SARS-CoV-2 RNA is generally detectable in upper respiratory specimens during the acute phase of infection. The lowest concentration of SARS-CoV-2 viral copies this assay can detect is 138 copies/mL. A negative result does not preclude SARS-Cov-2 infection  and should not be used as the sole basis for treatment or other patient management decisions. A negative result may occur with  improper specimen collection/handling, submission of specimen other than nasopharyngeal swab, presence of viral mutation(s) within the areas targeted by this assay, and inadequate number of viral copies(<138 copies/mL). A negative result must be combined with clinical observations, patient history, and epidemiological information. The expected result is Negative.  Fact Sheet for Patients:  EntrepreneurPulse.com.au  Fact Sheet for Healthcare Providers:  IncredibleEmployment.be  This test is no t yet approved or cleared by the [place] FDA and  has been authorized for detection and/or diagnosis of SARS-CoV-2 by FDA under an Emergency Use Authorization (EUA). This EUA will remain  in effect (meaning this test can be used) for the duration of the COVID-19 declaration under Section 564(b)(1) of the Act, 21 U.S.C.section 360bbb-3(b)(1), unless the authorization is terminated  or revoked sooner.       Influenza A by PCR NEGATIVE  NEGATIVE Final   Influenza B by PCR NEGATIVE NEGATIVE Final    Comment: (NOTE) The Xpert Xpress SARS-CoV-2/FLU/RSV plus assay is intended as an aid in the diagnosis of influenza from Nasopharyngeal swab specimens and should not be used as a sole basis for treatment. Nasal washings and aspirates are unacceptable for Xpert Xpress SARS-CoV-2/FLU/RSV testing.  Fact Sheet for Patients: EntrepreneurPulse.com.au  Fact Sheet for Healthcare Providers: IncredibleEmployment.be  This test is not yet approved or cleared by the [place] FDA and has been authorized for detection and/or diagnosis of SARS-CoV-2 by FDA under an Emergency Use Authorization (EUA). This EUA will remain in effect (meaning this test can be used) for the duration of the COVID-19 declaration under Section 564(b)(1) of the Act, 21 U.S.C. section 360bbb-3(b)(1), unless the authorization is terminated or revoked.  Performed at [facility], [place] [address] [place], [place] [address]   MRSA Next Gen by PCR, Nasal     Status: None   Collection Time: 04/28/21 12:29 AM   Specimen: Nasal Mucosa; Nasal Swab  Result Value Ref Range Status   MRSA by PCR Next Gen NOT DETECTED NOT DETECTED Final    Comment: (NOTE) The GeneXpert MRSA Assay (FDA approved for NASAL specimens only), is one component of a comprehensive MRSA colonization surveillance program. It is not intended to diagnose MRSA infection nor to guide or monitor treatment for MRSA infections. Test performance is not FDA approved in patients less than 56 years old. Performed at [facility], [place] [address], [place], [place] [address]   MRSA Next Gen by PCR, Nasal     Status: None   Collection Time: 04/28/21  9:09 AM   Specimen: Nasal Mucosa; Nasal Swab  Result Value Ref Range Status   MRSA by PCR Next Gen NOT DETECTED NOT DETECTED Final    Comment: (NOTE) The GeneXpert MRSA Assay (FDA  approved for NASAL specimens only), is one component of a comprehensive MRSA colonization surveillance program. It is not intended to diagnose MRSA infection nor to guide or monitor treatment for MRSA infections. Test performance is not FDA approved in patients less than 28 years old. Performed at [facility], [place] [address] [place], [place] [address]   Culture, blood (Routine X 2) w Reflex to ID Panel     Status: None   Collection Time: 04/28/21 12:25 PM   Specimen: BLOOD  Result Value Ref Range Status   Specimen Description   Final    BLOOD LEFT ANTECUBITAL  Performed at [facility], [place] [address] [place], [place] [address]    Special Requests   Final    BOTTLES DRAWN AEROBIC ONLY Blood Culture adequate volume Performed at [place] [address] [place], [place] [address]    Culture   Final    NO GROWTH 5 DAYS Performed at [facility], [place] [address] [place], [place] [address]    Report Status 05/03/2021 FINAL  Final  Culture, blood (Routine X 2) w Reflex to ID Panel     Status: None   Collection Time: 04/28/21 12:25 PM   Specimen: BLOOD  Result Value Ref Range Status   Specimen Description   Final    BLOOD LEFT ANTECUBITAL Performed at [place] [address], [place], [place] [address]    Special Requests   Final    BOTTLES DRAWN AEROBIC ONLY Blood Culture adequate volume Performed at [place] [address], [place], [place] [address]    Culture   Final    NO GROWTH 5 DAYS Performed at [facility], [place] [address], [place], [place] [address]    Report Status 05/03/2021 FINAL  Final  Urine Culture     Status: None   Collection Time: 04/28/21  5:03 PM   Specimen: Urine, Catheterized  Result Value Ref Range Status   Specimen Description   Final    URINE, CATHETERIZED Performed at [facility] [address],  [place], [place] [address]    Special Requests   Final    NONE Performed at [facility], [place] [address], [place],  [address]    Culture   Final    NO GROWTH Performed at [facility], [place] [address] [place],  [address]    Report Status 04/29/2021 FINAL  Final          Radiology Studies: No results found.      Scheduled Meds:  (feeding supplement) PROSource Plus  30 mL Oral Daily   budesonide (PULMICORT) nebulizer solution  0.5 mg Nebulization BID   Chlorhexidine Gluconate Cloth  6 each Topical Daily   feeding supplement  1 Container Oral Q24H   feeding supplement  237 mL Oral Q24H   fluticasone  2 spray Each Nare Daily   mouth rinse  15 mL Mouth Rinse BID   tamsulosin  0.4 mg Oral Daily   Continuous Infusions:  sodium chloride 10 mL/hr at 04/29/21 1300   chlorproMAZINE (THORAZINE) IV     HYDROmorphone       LOS: 6 days    Time spent: 35 minutes    Irine Seal, MD [facility]   To contact the attending provider between 7A-7P or the covering provider during after hours 7P-7A, please log into the web site [email or web address] and access using universal [place] password for that web site. If you do not have the password, please call the hospital operator.  05/03/2021, 12:44 PM

## 2021-05-04 DIAGNOSIS — N179 Acute kidney failure, unspecified: Secondary | ICD-10-CM | POA: Diagnosis not present

## 2021-05-04 DIAGNOSIS — Z515 Encounter for palliative care: Secondary | ICD-10-CM | POA: Diagnosis not present

## 2021-05-04 DIAGNOSIS — I10 Essential (primary) hypertension: Secondary | ICD-10-CM | POA: Diagnosis not present

## 2021-05-04 DIAGNOSIS — J9621 Acute and chronic respiratory failure with hypoxia: Secondary | ICD-10-CM | POA: Diagnosis not present

## 2021-05-04 DIAGNOSIS — J189 Pneumonia, unspecified organism: Secondary | ICD-10-CM | POA: Diagnosis not present

## 2021-05-04 MED ORDER — BISACODYL 10 MG RE SUPP: 10.0000 mg | Freq: Every day | RECTAL | 0 refills | Status: AC | PRN

## 2021-05-04 MED ORDER — GLYCOPYRROLATE 1 MG PO TABS: 1.0000 mg | ORAL_TABLET | ORAL | 0 refills | Status: AC | PRN

## 2021-05-04 MED ORDER — LORAZEPAM 2 MG/ML PO CONC: 1.0000 mg | ORAL | 0 refills | Status: AC | PRN

## 2021-05-04 MED ORDER — POLYVINYL ALCOHOL 1.4 % OP SOLN: 1.0000 [drp] | Freq: Four times a day (QID) | OPHTHALMIC | 0 refills | Status: AC | PRN

## 2021-05-04 MED ORDER — HALOPERIDOL 0.5 MG PO TABS: 0.5000 mg | ORAL_TABLET | ORAL | 0 refills | Status: AC | PRN

## 2021-05-04 MED ORDER — MORPHINE SULFATE (CONCENTRATE) 10 MG /0.5 ML PO SOLN: 10.0000 mg | ORAL | 0 refills | Status: AC | PRN

## 2021-06-01 NOTE — Care Management Important Message (Signed)
Important Message  Patient Details IM Letter placed in the Patient's room. Name: [name] MRN: [id number] Date of Birth: 1934-06-17   Medicare Important Message Given:  Yes     William Buchanan May 25, 2021, 11:20 AM

## 2021-06-01 NOTE — Progress Notes (Signed)
Daily Progress Note   Patient Name: William Buchanan       Date: May 13, 2021 DOB: 06-19-1934  Age: 87 y.o. MRN#: [id number] Attending Physician: Eugenie Filler, MD Primary Care Physician: Leota Jacobsen, MD Admit Date: 04/05/2021  Reason for Consultation/Follow-up: Terminal Care  Subjective: Lying in bed in no distress.  Regular respirations.  He does not verbalize other than unintelligible vocalization with tactile stimulation.  Length of Stay: 7  Current Medications: Scheduled Meds:   (feeding supplement) PROSource Plus  30 mL Oral Daily   budesonide (PULMICORT) nebulizer solution  0.5 mg Nebulization BID   Chlorhexidine Gluconate Cloth  6 each Topical Daily   feeding supplement  1 Container Oral Q24H   feeding supplement  237 mL Oral Q24H   fluticasone  2 spray Each Nare Daily   mouth rinse  15 mL Mouth Rinse BID   tamsulosin  0.4 mg Oral Daily    Continuous Infusions:  sodium chloride Stopped (05/03/21 1500)   chlorproMAZINE (THORAZINE) IV     HYDROmorphone 0.5 mg/hr (2021-05-13 0307)    PRN Meds: sodium chloride, acetaminophen, antiseptic oral rinse, bisacodyl, chlorproMAZINE (THORAZINE) IV, diphenhydrAMINE, glycopyrrolate **OR** glycopyrrolate **OR** glycopyrrolate, haloperidol **OR** haloperidol **OR** haloperidol lactate, HYDROmorphone (DILAUDID) injection, ipratropium-albuterol, LORazepam **OR** LORazepam **OR** LORazepam, magic mouthwash w/lidocaine, nystatin, ondansetron **OR** ondansetron (ZOFRAN) IV, polyvinyl alcohol  Physical Exam         Status post left BKA, generalized weakness, frailty Shallow regular breath sounds Not awake alert  No distress Has biliary drain  Vital Signs: BP (!) 84/72 (BP Location: Left Arm)   Pulse 96   Temp (!) 97.5 F (36.4 C)  (Oral)   Resp 16   Ht 5\' 9"  (1.753 m)   Wt 72 kg   SpO2 (!) 85%   BMI 23.44 kg/m  SpO2: SpO2: (!) 85 % O2 Device: O2 Device: Nasal Cannula O2 Flow Rate: O2 Flow Rate (L/min): 5 L/min  Intake/output summary:  Intake/Output Summary (Last 24 hours) at 05/13/21 1855 Last data filed at 13-May-2021 1850 Gross per 24 hour  Intake 11.6 ml  Output --  Net 11.6 ml    LBM: Last BM Date:  (uta) Baseline Weight: Weight: 72 kg Most recent weight: Weight: 72 kg       Palliative Assessment/Data:      Patient  Active Problem List   Diagnosis Date Noted   History of cholecystitis 04/28/2021   AKI (acute kidney injury) ([place]) 04/28/2021   PVD (peripheral vascular disease) ([place]) 04/28/2021   CAD (coronary artery disease) 04/28/2021   Pressure injury of skin 04/28/2021   Hypothermia 04/28/2021   Community acquired pneumonia    Acute on chronic respiratory failure with hypoxia ([place]) 04/17/2021   Pulmonary embolism ([place]) 04/02/2021   Abdominal pain 03/30/2021   Migration of percutaneous transhepatic biliary drain catheter 03/30/2021   S/P AKA (above knee amputation) unilateral, left ([place]) 03/30/2021   Fall from wheelchair 03/30/2021   Leukocytosis 03/30/2021   COPD (chronic obstructive pulmonary disease) ([place]) 03/30/2021   Hyperkalemia 03/30/2021   Critical lower limb ischemia ([place]) 06/07/2017   Severe protein-calorie malnutrition ([place]) 06/07/2017   Diverticulitis 02/18/2017   Arcus senilis, bilateral 01/10/2017   History of non-ST elevation myocardial infarction (NSTEMI) 12/26/2016   Bilateral carotid artery stenosis 08/25/2016   Benign neoplasm of colon 07/07/2015   Inflammatory polyarthritis ([place]) 07/07/2015   Type 2 diabetes mellitus with diabetic polyneuropathy, without long-term current use of insulin ([place]) 07/07/2015   Carotid atherosclerosis 07/07/2015   Local infection of skin and subcutaneous tissue 09/15/2014   Bunion 09/15/2014   Ulcer of heel and midfoot ([place])  09/15/2014   Essential hypertension 04/16/2014    Palliative Care Assessment & Plan   Patient Profile: Patient is 87 years old.  He has centrilobular emphysema, chronic hypoxic respiratory failure.  More recently he had gangrenous cholecystitis status postcholecystectomy complicated by bile leak, status post percutaneous biliary drain.  Patient on chart review noted to have right upper lobe cavitation known since April 2022, seen at [facility], prior CT chest with background emphysema and honeycombing. Patient admitted to the hospital at [facility] [place], [place] for failure to thrive, severe protein calorie malnutrition, acute encephalopathy and recent hallucinations.  Patient also admitted for acute on chronic hypoxic respiratory failure in the setting of pneumonia and sepsis due to pneumonia.  Initially on pulmonary critical care medicine service and then transition to hospital medicine, started on comfort measures.  Palliative medicine consultation for terminal care as well as appropriate disposition options has been requested.  Assessment: Frailty, generalized weakness Mild generalized pain Possible dyspnea episodic.   Delirium, possibly terminal.   Recommendations/Plan: Pain/shortness of breath: Continue Dilaudid infusion I examined William Buchanan today.  He appears to be to be stable to transition to residential hospice.  Bed available today and plan to transition to hospice this afternoon. Prognosis likely a limited number of days.  PPS 20%  Goals of Care and Additional Recommendations: Limitations on Scope of Treatment: Full Comfort Care  Code Status:    Code Status Orders  (From admission, onward)           Start     Ordered   04/29/21 1843  Do not attempt resuscitation (DNR)  Continuous       Question Answer Comment  In the event of cardiac or respiratory ARREST Do not call a "code blue"   In the event of cardiac or respiratory ARREST Do  not perform Intubation, CPR, defibrillation or ACLS   In the event of cardiac or respiratory ARREST Use medication by any route, position, wound care, and other measures to relive pain and suffering. May use oxygen, suction and manual treatment of airway obstruction as needed for comfort.      04/29/21 1846  Code Status History     Date Active Date Inactive Code Status Order ID Comments User Context   04/29/2021 1708 04/29/2021 1846 DNR [id number]  Julian Hy, DO Inpatient   04/04/2021 2339 04/29/2021 1708 DNR [id number]  Orene Desanctis, DO ED   03/30/2021 2130 04/08/2021 2016 Full Code [id number]  Chotiner, Yevonne Aline, MD ED       Prognosis:  Hours to days.   Discharge Planning: Hospice facility  Care plan was discussed with [facility] MD, bedside RN.   Thank you for allowing the Palliative Medicine Team to assist in the care of this patient.   Time In: 1030 Time Out: 1100 Total Time 30 Prolonged Time Billed  no    Greater than 50%  of this time was spent counseling and coordinating care related to the above assessment and plan.   Micheline Rough, MD  Please contact Palliative Medicine Team phone at [phone] for questions and concerns.

## 2021-06-01 NOTE — Discharge Summary (Signed)
Physician Discharge Summary  William Buchanan XFG:[id number] DOB: 03/19/1934 DOA: 04/29/2021  PCP: Leota Jacobsen, MD  Admit date: 04/11/2021 Discharge date: 2021-05-31  Time spent: 50 minutes  Recommendations for Outpatient Follow-up:  Patient will be discharged to residential hospice home of [facility]. Follow-up with MD at residential hospice home.   Discharge Diagnoses:  Principal Problem:   Acute on chronic respiratory failure with hypoxia (HCC) Active Problems:   Essential hypertension   Type 2 diabetes mellitus with diabetic polyneuropathy, without long-term current use of insulin (HCC)   S/P AKA (above knee amputation) unilateral, left (HCC)   History of cholecystitis   AKI (acute kidney injury) ([place])   PVD (peripheral vascular disease) ([place])   CAD (coronary artery disease)   Pressure injury of skin   Hypothermia   Community acquired pneumonia   Discharge Condition: Stable  Diet recommendation: Comfort feeds  Filed Weights   04/22/2021 1906  Weight: 72 kg    History of present illness:  HPI per Dr.Tu Berlinda Last is a 87 y.o. male with medical history significant for COPD with chronic hypoxemia on 2.5 L, interstitial lung disease, type 2 diabetes, recent PE on Eliquis, PVD s/p left AKA, CVA, CAD s/p stent,hyperlipidemia, Recent gangrenous cholecystitis s/p biliary drain who presents from SNF for increasing shortness of breath and hypoxia.   Patient unable to provide history.  No family at bedside.  Reports his breathing is not that bad.  He just hurts all over.  Denies any pain around his biliary drain site.   On route with EMS, he was placed on 6 L and placed on 15 L nonrebreather in ED.  There is difficulty obtaining pulse oximetry in this patient likely due to severe PAD.  However, VBG is reassuring without any acidosis or hypercarbia.  WBC of 20, hemoglobin of 12.7, platelet of 696.  Sodium of 138, potassium of 4.6, creatinine of 1.54 from a prior of 0.71 with  BUN of 62.   TSH of 10.  BNP of 118.  Troponin of 9.  Chest x-ray shows patchy right upper lobe consolidation which could represent pneumonia.  D-dimer was elevated 2.25. CTA chest obtained showed resolution of previous PE seen in July, there is interval development of left lower lobe peribronchovascular groundglass opacity suggestive of possible inflammation versus infection.   He was started on IV Rocephin and azithromycin and hospitalist was called for admission.   Hospital Course:  1 acute on chronic hypoxemic respiratory failure likely secondary to pneumonia, POA -Patient noted to have a baseline O2 requirements of 2.5 L however required 15 L on nonrebreather on presentation to the ED. -Noted to have difficult to obtain peripheral pulse ox as patient with severe PAD. -VBG done was reassuring with normal pH, no hypercarbia, patient able to speak in full sentences with no use of accessory muscles of respiration. -Sputum gram stain and cultures pending. -Urine Legionella pending, urine strep pneumococcus negative.. -Patient with ongoing hypothermia and as such blood cultures ordered which are pending.  -CT chest findings with left lower lobe groundglass opacities noted concerning for infection versus inflammation. -Patient seen in consultation by PCCM who feel patient may have a component of bilateral lower lobe aspiration pneumonia, viral pneumonia possible and does not feel CT findings are highly suggestive of a ILD flare. -Patient initially was on IV vancomycin which was discontinued and patient subsequently kept on IV cefepime and IV azithromycin as well as Pulmicort, Flonase, scheduled duo nebs, PPI.   -Patient seen  by PCCM who discussed with family and patient has been transitioned to full comfort measures.   -Palliative care consulted and following. -IV antibiotics have subsequently been discontinued.   -Patient will be discharged to residential hospice home of [facility].  2.   Hypothermia -??  Etiology. -May be secondary to acute infectious process secondary to problem #1. -Urinalysis done nitrite negative, leukocytes negative.  Urine strep pneumococcus antigen negative.   -Urine Legionella antigen negative. -Blood cultures pending with no growth to date..   -Procalcitonin negative.  -TSH noted at 10.568.  Free T4 within normal limits at 0.94. -Abnormal TSH likely secondary to euthyroid sick syndrome and will need repeat labs done in about 4 to 6 weeks in the outpatient setting. -Hypothermia improved.   -Patient transitioned to full comfort measures.   -IV antibiotics discontinued.  -Patient with discharge to residential hospice home of [facility].  3.  Recent gangrenous cholecystitis status post biliary drain -Patient noted to have been hospitalized from 7/26-8/10 for severe sepsis secondary to gangrenous cholecystitis status post percutaneous drain placement. -Patient noted to have drain dislodged and was readmitted with sepsis due to possible peritonitis/cholecystitis with successful placement of biliary drain 03/31/2021. -Patient with some right upper quadrant abdominal tenderness to palpation. -Percutaneous drain with biliary drainage noted. -Right upper quadrant ultrasound with no acute findings, percutaneous biliary drain which is normally located by preceding CT. -CT abdomen and pelvis with no acute findings or abscess noted.  Stable biliary drain positioning. -Outpatient follow-up with IR.  4.  Acute kidney injury -Likely secondary to prerenal azotemia. -Blood pressure soft with some response to IV fluids.   -Renal function improved with hydration.  -Patient transitioned to full comfort measures.  5.  Abnormal TSH -TSH of 10.568. -Free T4 within normal limits at 0.94. -Likely euthyroid sick syndrome which was noted in April at [facility]. -Patient transitioned to full comfort measures. -Patient be discharged to residential  hospice, [place].  6.  PVD -Status post AKA, second toe amputation on the right. -Risk factor modification. -Patient transitioned to full comfort measures and as such Eliquis  discontinued.   7.  History of recent PE -Patient transitioned to full comfort measures. -Eliquis discontinued. .  8.  Coronary artery disease status post stent placement -Patient transitioned to full comfort measures.  Eliquis and statin have been discontinued.  -Patient will be discharged to residential hospice home of [facility].  9.  Type 2 diabetes mellitus -Hemoglobin A1c 7.5 (04/02/2021). -Patient has been transition to full comfort measures.   -CBGs discontinued.  -Semglee and SSI discontinued.  10.  Irregular heart rhythm -EKG showed sinus rhythm with some PACs.   -Patient back in normal sinus rhythm.   -Patient has been transitioned to full comfort measures. -Patient to be discharged to residential hospice home of [facility]  11.  Failure to thrive/multiple recent hospital admissions -Per PCCM they discussed with family patient has deteriorated over time and PCCM recommended palliative care consultation for goals of care which has been ordered. -Continue DNR status. -PCCM discussed with family and decision was made to transition to full comfort measures. -Palliative care following. -Awaiting residential hospice home placement. -Patient started on a Dilaudid drip as per palliative care patient now with possible terminal delirium. -Patient will be discharged to residential hospice home.  12.  Borderline blood pressure -Patient noted to have some systolic blood pressures in the 90s. -Patient given the fluid bolus with some response. -Patient seen by PCCM and  discussed with family and patient has been transitioned to full comfort measures. -Patient to be discharged to residential hospice drip.  13.  Pressure injury, POA Pressure Injury 04/28/21 Buttocks Right Stage 2 -  Partial thickness  loss of dermis presenting as a shallow open injury with a red, pink wound bed without slough. (Active)  04/28/21 0200  Location: Buttocks  Location Orientation: Right  Staging: Stage 2 -  Partial thickness loss of dermis presenting as a shallow open injury with a red, pink wound bed without slough.  Wound Description (Comments):   Present on Admission: Yes       Procedures: CT head 04/18/2021 CT angiogram chest 04/26/2021 CT abdomen and pelvis 04/28/2021 Chest x-ray 04/26/2021 Abdominal ultrasound 04/28/2021  Consultations: PCCM: Dr. Carlis Abbott 04/28/2021 Palliative care: Dr. Rowe Pavy 05/01/2021  Discharge Exam: Vitals:   2021-06-03 0545 03-Jun-2021 0638  BP: (!) 84/72   Pulse: 96   Resp: 16   Temp: (!) 97.5 F (36.4 C)   SpO2: (!) 83% (!) 85%    General: Minimally responsive. Cardiovascular: RRR no murmurs rubs or gallops.  No JVD.  No lower extremity edema. Respiratory: Gurgling coarse breath sounds anterior lung fields.  Discharge Instructions   Discharge Instructions     Diet general   Complete by: As directed    Comfort feeds/dysphagia 3   Discharge wound care:   Complete by: As directed    As above   Increase activity slowly   Complete by: As directed       Allergies as of 06/03/2021       Reactions   Anesthetics, Amide    Made me crazy and hard to handle.   Doxycycline Shortness Of Breath   Zolpidem    Other reaction(s): Mental Status Changes (intolerance) "combative"   Amoxicillin-pot Clavulanate Rash           Medication List     STOP taking these medications    apixaban 5 MG Tabs tablet Commonly known as: ELIQUIS   ascorbic acid 500 MG tablet Commonly known as: VITAMIN C   cephALEXin 500 MG capsule Commonly known as: KEFLEX   cetirizine 10 MG tablet Commonly known as: ZYRTEC   Cholecalciferol 25 MCG (1000 UT) tablet   ciprofloxacin 500 MG tablet Commonly known as: CIPRO   glipiZIDE 5 MG tablet Commonly known as: GLUCOTROL   HumaLOG  KwikPen 100 UNIT/ML KwikPen Generic drug: insulin lispro   memantine 5 MG tablet Commonly known as: NAMENDA   mirtazapine 15 MG tablet Commonly known as: REMERON   multivitamin with minerals tablet   oxyCODONE-acetaminophen 5-325 MG tablet Commonly known as: PERCOCET/ROXICET   pantoprazole sodium 40 mg/20 mL Susp Commonly known as: PROTONIX   pioglitazone 30 MG tablet Commonly known as: ACTOS   PRESCRIPTION MEDICATION   simvastatin 20 MG tablet Commonly known as: ZOCOR   Trelegy Ellipta 100-62.5-25 MCG/INH Aepb Generic drug: Fluticasone-Umeclidin-Vilant   vitamin B-12 250 MCG tablet Commonly known as: CYANOCOBALAMIN       TAKE these medications    acetaminophen 500 MG tablet Commonly known as: TYLENOL Take 500 mg by mouth every 6 (six) hours as needed for moderate pain.   bisacodyl 10 MG suppository Commonly known as: DULCOLAX Place 1 suppository (10 mg total) rectally daily as needed for moderate constipation.   gabapentin 300 MG capsule Commonly known as: NEURONTIN Take 100 mg by mouth 2 (two) times daily.   glycopyrrolate 1 MG tablet Commonly known as: ROBINUL Take 1 tablet (1 mg total) by  mouth every 4 (four) hours as needed (excessive secretions).   haloperidol 0.5 MG tablet Commonly known as: HALDOL Take 1 tablet (0.5 mg total) by mouth every 4 (four) hours as needed for agitation (or delirium).   ipratropium-albuterol 0.5-2.5 (3) MG/3ML Soln Commonly known as: DUONEB Take 3 mLs by nebulization every 6 (six) hours as needed (Shortness of breath, cough and/or wheeze).   LORazepam 2 MG/ML concentrated solution Commonly known as: ATIVAN Place 0.5 mLs (1 mg total) under the tongue every 4 (four) hours as needed for anxiety.   morphine CONCENTRATE 10 mg / 0.5 ml concentrated solution Take 0.5 mLs (10 mg total) by mouth every 4 (four) hours as needed for severe pain.   mupirocin ointment 2 % Commonly known as: BACTROBAN Apply 1 application  topically 2 (two) times daily.   polyvinyl alcohol 1.4 % ophthalmic solution Commonly known as: LIQUIFILM TEARS Place 1 drop into both eyes 4 (four) times daily as needed for dry eyes.   tamsulosin 0.4 MG Caps capsule Commonly known as: FLOMAX Take 0.4 mg by mouth daily.               Discharge Care Instructions  (From admission, onward)           Start     Ordered   2021/05/09 0000  Discharge wound care:       Comments: As above   2021-05-09 1238           Allergies  Allergen Reactions   Anesthetics, Amide     Made me crazy and hard to handle.   Doxycycline Shortness Of Breath   Zolpidem     Other reaction(s): Mental Status Changes (intolerance) "combative"   Amoxicillin-Pot Clavulanate Rash         Follow-up Information     MD at residential hospice home. Follow up.                   The results of significant diagnostics from this hospitalization (including imaging, microbiology, ancillary and laboratory) are listed below for reference.    Significant Diagnostic Studies: CT HEAD WO CONTRAST (5MM)  Result Date: 04/23/2021 CLINICAL DATA:  Encephalopathy EXAM: CT HEAD WITHOUT CONTRAST TECHNIQUE: Contiguous axial images were obtained from the base of the skull through the vertex without intravenous contrast. COMPARISON:  02/19/2021 FINDINGS: Brain: There is no mass, hemorrhage or extra-axial collection. There is generalized volume loss. There is hypoattenuation of the white matter, most commonly indicating chronic small vessel disease. Old right frontal and bilateral cerebellar infarcts. Vascular: No abnormal hyperdensity of the major intracranial arteries or dural venous sinuses. No intracranial atherosclerosis. Skull: The visualized skull base, calvarium and extracranial soft tissues are normal. Sinuses/Orbits: No fluid levels or advanced mucosal thickening of the visualized paranasal sinuses. No mastoid or middle ear effusion. The orbits are normal.  IMPRESSION: 1. No acute intracranial abnormality. 2. Old right frontal and bilateral cerebellar infarcts and findings of chronic small vessel disease. Electronically Signed   By: Ulyses Jarred M.D.   On: 04/01/2021 21:09   CT Angio Chest PE W and/or Wo Contrast  Result Date: 04/17/2021 CLINICAL DATA:  PE suspected, low/intermediate prob, positive D-dimer. SOB and AMS EXAM: CT ANGIOGRAPHY CHEST WITH CONTRAST TECHNIQUE: Multidetector CT imaging of the chest was performed using the standard protocol during bolus administration of intravenous contrast. Multiplanar CT image reconstructions and MIPs were obtained to evaluate the vascular anatomy. CONTRAST:  7mL OMNIPAQUE IOHEXOL 350 MG/ML SOLN COMPARISON:  Chest x-ray 04/01/2021,  CT angio chest 02/18/2021, CT chest 11/26/2020, CT angiography chest 12/26/2016 FINDINGS: Cardiovascular: Satisfactory opacification of the pulmonary arteries to the segmental level. No evidence of pulmonary embolism. Interval resolution of previously identified right lower lobe subsegmental pulmonary embolus on CT angio chest 02/18/2021. The main pulmonary artery is normal in caliber. Normal heart size. No pericardial effusion. Severe atherosclerotic plaque of the aorta. Four-vessel coronary artery calcifications. Mediastinum/Nodes: No enlarged mediastinal, hilar, or axillary lymph nodes. Thyroid gland, trachea, and esophagus demonstrate no significant findings. Lungs/Pleura: Redemonstration of peripheral cystic changes and reticulations. At least moderate centrilobular emphysematous changes. Stable 2.3 x 2.4 cm subpleural right upper lobe consolidation with cystic changes. Interval development of right lower lobe peribronchovascular subsolid nodular-like densities. Upper Abdomen: Colonic diverticulosis. Transhepatic tube partially visualized status post cholecystectomy no acute abnormality. Musculoskeletal: No chest wall abnormality. No suspicious lytic or blastic osseous lesions. No acute  displaced fracture. Multilevel degenerative changes of the spine. Review of the MIP images confirms the above findings. IMPRESSION: 1. No pulmonary embolus. 2. Interval development of left lower lobe peribronchovascular ground-glass airspace opacities. Findings suggestive of infection/inflammation. Non-contrast chest CT at 3-6 months is recommended. If nodules persist, subsequent management will be based upon the most suspicious nodule(s). This recommendation follows the consensus statement: Guidelines for Management of Incidental Pulmonary Nodules Detected on CT Images: From the Fleischner Society 2017; Radiology 2017; 284:228-243. 3. Stable 2.3 x 2.4 cm subpleural right upper lobe consolidation with cystic changes. Finding may represent scarring versus cavitary lesion versus atypical infection. 4. Aortic Atherosclerosis (ICD10-I70.0) and Emphysema (ICD10-J43.9). 5. Electronically Signed   By: Iven Finn M.D.   On: 04/11/2021 22:26   CT ABDOMEN PELVIS W CONTRAST  Result Date: 04/28/2021 CLINICAL DATA:  Abdominal abscess or infection suspected. Right abdominal pain. Percutaneous biliary drainage. EXAM: CT ABDOMEN AND PELVIS WITH CONTRAST TECHNIQUE: Multidetector CT imaging of the abdomen and pelvis was performed using the standard protocol following bolus administration of intravenous contrast. CONTRAST:  63mL OMNIPAQUE IOHEXOL 350 MG/ML SOLN COMPARISON:  04/02/2021 FINDINGS: Lower chest: Chest CTA performed few hours earlier. Improved aeration at the right base where there is diminished pleural fluid and better aerated lung. Subpleural pulmonary fibrosis. Atheromatous and cardiac calcification. Hepatobiliary: No focal liver abnormality.Cholecystectomy. Percutaneous biliary drain in stable position, reaching the duodenum. Chronic mild dilatation of intrahepatic ducts in the upper right lobe liver which may be related to catheter. Pancreas: No acute finding. Diffuse atrophy and chronic ductal dilatation  towards the midline. Spleen: Unremarkable. Adrenals/Urinary Tract: Negative adrenals. No hydronephrosis or obstructing stone. Limited for detecting calculi given contrast excretion at time of imaging. Bilateral renal cortical thinning/scarring. Unremarkable bladder. Stomach/Bowel: No obstruction. No evidence of acute inflammation. Left colonic diverticulosis. Vascular/Lymphatic: No acute vascular abnormality. Severe atheromatous calcification of the aorta and branch vessels. No mass or adenopathy. Reproductive:Unremarkable for age Other: No ascites or pneumoperitoneum. High-density nodule at the inferior right liver tip which may reflect gallstone as previously suggested. No convincing adjacent inflammation. Musculoskeletal: No acute abnormalities. Osteopenia and generalized spinal degeneration with mild scoliosis. L5 chronic pars defects with L5-S1 anterolisthesis and severe foraminal impingement. IMPRESSION: 1. No acute finding or abscess.  Stable biliary drain positioning. 2. Numerous chronic findings are described above. Electronically Signed   By: Jorje Guild M.D.   On: 04/28/2021 05:11   DG Chest Portable 1 View  Result Date: 04/20/2021 CLINICAL DATA:  Tachypnea, short of breath EXAM: PORTABLE CHEST 1 VIEW COMPARISON:  02/18/2021 FINDINGS: Single frontal view of the chest demonstrates a stable cardiac  silhouette. There is persistent patchy consolidation within the right upper lobe. Chronic background interstitial scarring throughout the lungs, greatest at the bases. No effusion or pneumothorax. No acute bony abnormalities. IMPRESSION: 1. Persistent patchy right upper lobe consolidation, which may reflect pneumonia. 2. Chronic background scarring and fibrosis. Electronically Signed   By: Randa Ngo M.D.   On: 04/14/2021 20:34   US Abdomen Limited RUQ (LIVER/GB)  Result Date: 04/28/2021 CLINICAL DATA:  Recent gangrenous cholecystitis with percutaneous cholecystostomy in biliary drainage.  Increased sanguinous material within the biliary drain back. EXAM: ULTRASOUND ABDOMEN LIMITED RIGHT UPPER QUADRANT COMPARISON:  Preceding abdominal CT FINDINGS: Gallbladder: History of cholecystectomy Common bile duct: Dilated at 13 mm, of doubtful significance given normal biliary labs. There is a traversing catheter which is better seen by CT. No evidence of internal hemorrhage or stone. Liver: The biliary drain is seen traversing the liver parenchyma. No evidence of mass or collection. Portal vein is patent on color Doppler imaging with normal direction of blood flow towards the liver. IMPRESSION: 1. No acute finding. 2. Percutaneous biliary drain which is normally located by preceding CT. Electronically Signed   By: Jorje Guild M.D.   On: 04/28/2021 06:16    Microbiology: Recent Results (from the past 240 hour(s))  Resp Panel by RT-PCR (Flu A&B, Covid) Nasopharyngeal Swab     Status: None   Collection Time: 04/19/2021  8:24 PM   Specimen: Nasopharyngeal Swab; Nasopharyngeal(NP) swabs in vial transport medium  Result Value Ref Range Status   SARS Coronavirus 2 by RT PCR NEGATIVE NEGATIVE Final    Comment: (NOTE) SARS-CoV-2 target nucleic acids are NOT DETECTED.  The SARS-CoV-2 RNA is generally detectable in upper respiratory specimens during the acute phase of infection. The lowest concentration of SARS-CoV-2 viral copies this assay can detect is 138 copies/mL. A negative result does not preclude SARS-Cov-2 infection and should not be used as the sole basis for treatment or other patient management decisions. A negative result may occur with  improper specimen collection/handling, submission of specimen other than nasopharyngeal swab, presence of viral mutation(s) within the areas targeted by this assay, and inadequate number of viral copies(<138 copies/mL). A negative result must be combined with clinical observations, patient history, and epidemiological information. The expected result  is Negative.  Fact Sheet for Patients:  EntrepreneurPulse.com.au  Fact Sheet for Healthcare Providers:  IncredibleEmployment.be  This test is no t yet approved or cleared by the [place] FDA and  has been authorized for detection and/or diagnosis of SARS-CoV-2 by FDA under an Emergency Use Authorization (EUA). This EUA will remain  in effect (meaning this test can be used) for the duration of the COVID-19 declaration under Section 564(b)(1) of the Act, 21 U.S.C.section 360bbb-3(b)(1), unless the authorization is terminated  or revoked sooner.       Influenza A by PCR NEGATIVE NEGATIVE Final   Influenza B by PCR NEGATIVE NEGATIVE Final    Comment: (NOTE) The Xpert Xpress SARS-CoV-2/FLU/RSV plus assay is intended as an aid in the diagnosis of influenza from Nasopharyngeal swab specimens and should not be used as a sole basis for treatment. Nasal washings and aspirates are unacceptable for Xpert Xpress SARS-CoV-2/FLU/RSV testing.  Fact Sheet for Patients: EntrepreneurPulse.com.au  Fact Sheet for Healthcare Providers: IncredibleEmployment.be  This test is not yet approved or cleared by the [place] FDA and has been authorized for detection and/or diagnosis of SARS-CoV-2 by FDA under an Emergency Use Authorization (EUA). This EUA will remain in effect (meaning this  test can be used) for the duration of the COVID-19 declaration under Section 564(b)(1) of the Act, 21 U.S.C. section 360bbb-3(b)(1), unless the authorization is terminated or revoked.  Performed at [facility], [place] [address], [place], [place] [address]   MRSA Next Gen by PCR, Nasal     Status: None   Collection Time: 04/28/21 12:29 AM   Specimen: Nasal Mucosa; Nasal Swab  Result Value Ref Range Status   MRSA by PCR Next Gen NOT DETECTED NOT DETECTED Final    Comment: (NOTE) The GeneXpert MRSA Assay (FDA approved  for NASAL specimens only), is one component of a comprehensive MRSA colonization surveillance program. It is not intended to diagnose MRSA infection nor to guide or monitor treatment for MRSA infections. Test performance is not FDA approved in patients less than 34 years old. Performed at [facility], [place] [address] [place], [place] [address]   MRSA Next Gen by PCR, Nasal     Status: None   Collection Time: 04/28/21  9:09 AM   Specimen: Nasal Mucosa; Nasal Swab  Result Value Ref Range Status   MRSA by PCR Next Gen NOT DETECTED NOT DETECTED Final    Comment: (NOTE) The GeneXpert MRSA Assay (FDA approved for NASAL specimens only), is one component of a comprehensive MRSA colonization surveillance program. It is not intended to diagnose MRSA infection nor to guide or monitor treatment for MRSA infections. Test performance is not FDA approved in patients less than 34 years old. Performed at [facility], [place] [address] [address], [place], [place] [address]   Culture, blood (Routine X 2) w Reflex to ID Panel     Status: None   Collection Time: 04/28/21 12:25 PM   Specimen: BLOOD  Result Value Ref Range Status   Specimen Description   Final    BLOOD LEFT ANTECUBITAL Performed at [facility] [address] [place], [place] [address]    Special Requests   Final    BOTTLES DRAWN AEROBIC ONLY Blood Culture adequate volume Performed at [facility] [address] [place], [place] [address]    Culture   Final    NO GROWTH 5 DAYS Performed at [facility], [place] [address], [place], [place] [address]    Report Status 05/03/2021 FINAL  Final  Culture, blood (Routine X 2) w Reflex to ID Panel     Status: None   Collection Time: 04/28/21 12:25 PM   Specimen: BLOOD  Result Value Ref Range Status   Specimen Description   Final    BLOOD LEFT ANTECUBITAL Performed at [facility] [address], [place], [place] [address]    Special Requests   Final    BOTTLES DRAWN AEROBIC ONLY Blood Culture adequate volume Performed at [facility] [address] [place], [place] [address]    Culture   Final    NO GROWTH 5 DAYS Performed at [facility], [place] [address], [place], [place] [address]    Report Status 05/03/2021 FINAL  Final  Urine Culture     Status: None   Collection Time: 04/28/21  5:03 PM   Specimen: Urine, Catheterized  Result Value Ref Range Status   Specimen Description   Final    URINE, CATHETERIZED Performed at [facility] [address] [place],  [address]    Special Requests   Final    NONE Performed at [facility], [place] [address], [place], [place] [address]    Culture   Final    NO GROWTH Performed at [facility], [place] [address], [place], [place] [address]    Report Status 04/29/2021 FINAL  Final     Labs: Basic Metabolic Panel: Recent Labs  Lab 04/21/2021 1907 04/28/21 0500 04/28/21 0922 04/29/21 0239  NA 138 132*  --  133*  K 4.6 5.5* 5.2* 4.5  CL 107 106  --  110  CO2 21* 18*  --  19*  GLUCOSE 72 158*  --  457*  BUN 62* 53*  --  47*  CREATININE 1.54* 1.31*  --  1.18  CALCIUM 11.4* 10.5*  --  9.3  MG  --   --   --  1.8   Liver Function Tests: Recent Labs  Lab 04/02/2021 1907  AST 19  ALT 13  ALKPHOS 93  BILITOT 0.5  PROT 7.2  ALBUMIN 2.7*   No results for input(s): LIPASE, AMYLASE in the last 168 hours. Recent Labs  Lab 04/08/2021 2059  AMMONIA 12   CBC: Recent Labs  Lab 04/23/2021 1907 04/28/21 0500 04/29/21 0239  WBC 20.2* 19.8* 13.1*  NEUTROABS  --   --  9.9*  HGB 12.7* 11.7* 10.1*  HCT 39.8 36.4* 31.6*  MCV 96.1 96.6 98.1  PLT 696* 519* 450*   Cardiac Enzymes: No results for input(s): CKTOTAL, CKMB, CKMBINDEX, TROPONINI in the last 168 hours. BNP: BNP (last 3 results) Recent Labs    04/21/2021 1907  BNP 117.8*    ProBNP (last 3  results) No results for input(s): PROBNP in the last 8760 hours.  CBG: Recent Labs  Lab 04/26/2021 1922 04/01/2021 2056 04/28/21 0310 04/29/21 0736 04/29/21 1154  GLUCAP 74 73 140* 175* 109*       Signed:  Irine Seal MD.  [facility] 05-19-2021, 12:39 PM

## 2021-06-01 NOTE — TOC Transition Note (Signed)
Transition of Care [facility]) - CM/SW Discharge Note   Patient Details  Name: CHEVY SWEIGERT MRN: [id number] Date of Birth: Dec 06, 1933  Transition of [facility]) CM/SW Contact:  Lynnell Catalan, RN Phone Number: 2021/05/09, 12:52 PM   Clinical Narrative:     Pt to dc to [facility] today. [facility] contacted for transport. Yellow DNR on chart for transport. RN to call report to [phone].  Final next level of care: Long Term Nursing Home Barriers to Discharge: Continued Medical Work up   Patient Goals and CMS Choice Patient states their goals for this hospitalization and ongoing recovery are:: go to rehab CMS Medicare.gov Compare Post Acute Care list provided to:: Patient Choice offered to / list presented to : Adult Children Discharge Plan and Services In-house Referral: Hospice / Palliative Care Discharge Planning Services: CM Consult Post Acute Care Choice: Nursing Home               Readmission Risk Interventions No flowsheet data found.

## 2021-06-01 NOTE — Progress Notes (Signed)
    OVERNIGHT PROGRESS REPORT  Notified by RN that patient has expired at 2114.  Patient was DNR to be moving to residential [facility].  2 RN verified.  Family was not immediately available to RN and are being notified at this time .     Gershon Cull MSNA ACNPC-AG Acute Care Nurse Practitioner [place]

## 2021-06-01 NOTE — Death Summary Note (Addendum)
DEATH SUMMARY   Patient Details  Name: William Buchanan MRN: [id number] DOB: [birth year]  Admission/Discharge Information   Admit Date:  2021-05-14  Date of Death: Date of Death: May 21, 2021  Time of Death: Time of Death: November 01, 2112  Length of Stay: 10-24-22  Referring Physician: Leota Jacobsen, MD   Reason(s) for Hospitalization  Shortness of breath/acute on chronic hypoxemic respiratory failure secondary to pneumonia/failure to thrive  Diagnoses  Preliminary cause of death:  Secondary Diagnoses (including complications and co-morbidities):  Principal Problem:   Acute on chronic respiratory failure with hypoxia ([place]) Active Problems:   Essential hypertension   Type 2 diabetes mellitus with diabetic polyneuropathy, without long-term current use of insulin (H[facility])   S/P AKA (above knee amputation) unilateral, left ([facility])   History of cholecystitis   AKI (acute kidney injury) ([place])   PVD (peripheral vascular disease) ([place])   CAD (coronary artery disease)   Pressure injury of skin   Hypothermia   Community acquired pneumonia   Brief Hospital Course (including significant findings, care, treatment, and services provided and events leading to death)  William Buchanan is a 87 y.o. year old male  history of COPD with chronic hypoxemia on 2-1/2 L nasal cannula, interstitial lung disease, type 2 diabetes, recent PE on Eliquis, PVD status post left AKA, CVA, CAD status post stenting, hyperlipidemia, recent gangrenous cholecystitis status post biliary drain presented to the SNF due to shortness of breath and hypoxia.  On route via EMS patient required 6 L and received 15 L nonrebreather in the ED.  Patient noted to have a leukocytosis white count of 20, TSH obtained, BNP 118, troponin of 9.  Chest x-ray done with patchy right upper lobe consolidation which could represent pneumonia.  D-dimer elevated CT angiogram chest showed resolution of previous PE seen in July with interval development of left lower lobe.   Bronchovascular groundglass opacities suggestive of possible inflammation versus infection. -Patient placed on admission initially on IV vancomycin, IV cefepime.  1 acute on chronic hypoxemic respiratory failure likely secondary to pneumonia, POA -Patient noted to have a baseline O2 requirements of 2.5 L however required 15 L on nonrebreather on presentation to the ED. -Noted to have difficult to obtain peripheral pulse ox as patient with severe PAD. -VBG done was reassuring with normal pH, no hypercarbia, patient able to speak in full sentences with no use of accessory muscles of respiration. -Sputum gram stain and cultures pending with no growth to date. -Urine Legionella pending, urine strep pneumococcus negative.. -Patient with ongoing hypothermia and as such blood cultures ordered which are pending.  -CT chest findings with left lower lobe groundglass opacities noted concerning for infection versus inflammation. -Patient seen in consultation by PCCM who feel patient may have a component of bilateral lower lobe aspiration pneumonia, viral pneumonia possible and does not feel CT findings are highly suggestive of a ILD flare. -Patient initially was on IV vancomycin which was discontinued and patient subsequently kept on IV cefepime and IV azithromycin as well as Pulmicort, Flonase, scheduled duo nebs, PPI.   -Patient seen by PCCM who discussed with family and patient transitioned to full comfort measures.   -Palliative care consulted and following. -IV antibiotics have subsequently been discontinued.   -Patient placed on a Dilaudid drip per palliative care recommendations. -Patient was to be discharged to residential hospice home of [facility], however patient continued to deteriorate and subsequently died at 01-Nov-2112 hrs. on Oct 85, 2022. -May his soul rest in peace.  2.  Hypothermia -??  Etiology. -May be secondary to acute infectious process secondary to problem #1. -Urinalysis done nitrite  negative, leukocytes negative.  Urine strep pneumococcus antigen negative.   -Urine Legionella antigen negative. -Blood cultures pending with no growth to date..   -Procalcitonin negative.  -TSH noted at 10.568.  Free T4 within normal limits at 0.94. -Abnormal TSH likely secondary to euthyroid sick syndrome and will need repeat labs done in about 4 to 6 weeks in the outpatient setting. -Hypothermia improved.   -Patient transitioned to full comfort measures.   -IV antibiotics discontinued.  -Patient was to be discharged to residential hospice home of [facility], patient continued to deteriorate and subsequently died at 2112/11/02 hrs on 22-Oct-85..  3.  Recent gangrenous cholecystitis status post biliary drain -Patient noted to have been hospitalized from 7/26-8/10 for severe sepsis secondary to gangrenous cholecystitis status post percutaneous drain placement. -Patient noted to have drain dislodged and was readmitted with sepsis due to possible peritonitis/cholecystitis with successful placement of biliary drain 03/31/2021. -Patient with some right upper quadrant abdominal tenderness to palpation. -Percutaneous drain with biliary drainage noted. -Right upper quadrant ultrasound with no acute findings, percutaneous biliary drain which is normally located by preceding CT. -CT abdomen and pelvis with no acute findings or abscess noted.  Stable biliary drain positioning. -Patient seen by palliative care and PCCM, decision was transition to full comfort measures. -Patient subsequently died at 11-02-12 hrs. on 22-Oct-85.  4.  Acute kidney injury -Likely secondary to prerenal azotemia. -Blood pressure soft with some response to IV fluids.   -Renal function improved with hydration.  -Patient transitioned to full comfort measures.  5.  Abnormal TSH -TSH of 10.568. -Free T4 within normal limits at 0.94. -Likely euthyroid sick syndrome which was noted in April at [facility]. -Patient  transitioned to full comfort measures. -Patient was be discharged to residential hospice, [facility], however patient continued to deteriorate and subsequently died at 2112/11/02 hrs on May 22, 2021.  6.  PVD -Status post AKA, second toe amputation on the right. -Risk factor modification. -Patient transitioned to full comfort measures and as such Eliquis  discontinued.   7.  History of recent PE -Patient transitioned to full comfort measures. -Eliquis discontinued. .  8.  Coronary artery disease status post stent placement -Patient transitioned to full comfort measures.  Eliquis and statin have been discontinued.  -Patient was to be discharged to residential hospice home of [facility], however patient continued to deteriorate and subsequently died at 11-02-2112 hrs on 2021/05/22.  9.  Type 2 diabetes mellitus -Hemoglobin A1c 7.5 (04/02/2021). -Patient transitioned to full comfort measures.   -CBGs discontinued.  -Semglee and SSI discontinued.  10.  Irregular heart rhythm -EKG showed sinus rhythm with some PACs.   -Patient back in normal sinus rhythm.   -Patient has been transitioned to full comfort measures.  11.  Failure to thrive/multiple recent hospital admissions -Per PCCM they discussed with family patient has deteriorated over time and PCCM recommended palliative care consultation for goals of care which has been ordered. -Continue DNR status. -PCCM discussed with family and decision was made to transition to full comfort measures. -Palliative care following. -Awaiting residential hospice home placement. -Patient started on a Dilaudid drip as per palliative care patient now with possible terminal delirium. -Patient was to be discharged to residential hospice home, however patient continued to deteriorate and subsequently died at November 02, 2112 hrs. on 05/22/21.  12.  Borderline blood pressure -Patient noted to have some systolic blood pressures in the  90s. -Patient given the fluid bolus with some  response. -Patient seen by PCCM and discussed with family and patient transitioned to full comfort measures. -Patient was to be discharged to residential hospice however patient continued to deteriorate and subsequently died at 2112/10/19 hrs. on 08-May-2021.  13.  Pressure injury, POA Pressure Injury 04/28/21 Buttocks Right Stage 2 -  Partial thickness loss of dermis presenting as a shallow open injury with a red, pink wound bed without slough. (Active)  04/28/21 0200  Location: Buttocks  Location Orientation: Right  Staging: Stage 2 -  Partial thickness loss of dermis presenting as a shallow open injury with a red, pink wound bed without slough.  Wound Description (Comments):   Present on Admission: Yes        Pertinent Labs and Studies  Significant Diagnostic Studies CT HEAD WO CONTRAST (5MM)  Result Date: 04/08/2021 CLINICAL DATA:  Encephalopathy EXAM: CT HEAD WITHOUT CONTRAST TECHNIQUE: Contiguous axial images were obtained from the base of the skull through the vertex without intravenous contrast. COMPARISON:  02/19/2021 FINDINGS: Brain: There is no mass, hemorrhage or extra-axial collection. There is generalized volume loss. There is hypoattenuation of the white matter, most commonly indicating chronic small vessel disease. Old right frontal and bilateral cerebellar infarcts. Vascular: No abnormal hyperdensity of the major intracranial arteries or dural venous sinuses. No intracranial atherosclerosis. Skull: The visualized skull base, calvarium and extracranial soft tissues are normal. Sinuses/Orbits: No fluid levels or advanced mucosal thickening of the visualized paranasal sinuses. No mastoid or middle ear effusion. The orbits are normal. IMPRESSION: 1. No acute intracranial abnormality. 2. Old right frontal and bilateral cerebellar infarcts and findings of chronic small vessel disease. Electronically Signed   By: Ulyses Jarred M.D.   On: 04/02/2021 21:09   CT Angio Chest PE W and/or Wo  Contrast  Result Date: 04/12/2021 CLINICAL DATA:  PE suspected, low/intermediate prob, positive D-dimer. SOB and AMS EXAM: CT ANGIOGRAPHY CHEST WITH CONTRAST TECHNIQUE: Multidetector CT imaging of the chest was performed using the standard protocol during bolus administration of intravenous contrast. Multiplanar CT image reconstructions and MIPs were obtained to evaluate the vascular anatomy. CONTRAST:  62mL OMNIPAQUE IOHEXOL 350 MG/ML SOLN COMPARISON:  Chest x-ray 04/05/2021, CT angio chest 02/18/2021, CT chest 11/26/2020, CT angiography chest 12/26/2016 FINDINGS: Cardiovascular: Satisfactory opacification of the pulmonary arteries to the segmental level. No evidence of pulmonary embolism. Interval resolution of previously identified right lower lobe subsegmental pulmonary embolus on CT angio chest 02/18/2021. The main pulmonary artery is normal in caliber. Normal heart size. No pericardial effusion. Severe atherosclerotic plaque of the aorta. Four-vessel coronary artery calcifications. Mediastinum/Nodes: No enlarged mediastinal, hilar, or axillary lymph nodes. Thyroid gland, trachea, and esophagus demonstrate no significant findings. Lungs/Pleura: Redemonstration of peripheral cystic changes and reticulations. At least moderate centrilobular emphysematous changes. Stable 2.3 x 2.4 cm subpleural right upper lobe consolidation with cystic changes. Interval development of right lower lobe peribronchovascular subsolid nodular-like densities. Upper Abdomen: Colonic diverticulosis. Transhepatic tube partially visualized status post cholecystectomy no acute abnormality. Musculoskeletal: No chest wall abnormality. No suspicious lytic or blastic osseous lesions. No acute displaced fracture. Multilevel degenerative changes of the spine. Review of the MIP images confirms the above findings. IMPRESSION: 1. No pulmonary embolus. 2. Interval development of left lower lobe peribronchovascular ground-glass airspace opacities.  Findings suggestive of infection/inflammation. Non-contrast chest CT at 3-6 months is recommended. If nodules persist, subsequent management will be based upon the most suspicious nodule(s). This recommendation follows the consensus statement: Guidelines for Management of  Incidental Pulmonary Nodules Detected on CT Images: From the Fleischner Society 2017; Radiology 2017; 284:228-243. 3. Stable 2.3 x 2.4 cm subpleural right upper lobe consolidation with cystic changes. Finding may represent scarring versus cavitary lesion versus atypical infection. 4. Aortic Atherosclerosis (ICD10-I70.0) and Emphysema (ICD10-J43.9). 5. Electronically Signed   By: Iven Finn M.D.   On: 04/25/2021 22:26   CT ABDOMEN PELVIS W CONTRAST  Result Date: 04/28/2021 CLINICAL DATA:  Abdominal abscess or infection suspected. Right abdominal pain. Percutaneous biliary drainage. EXAM: CT ABDOMEN AND PELVIS WITH CONTRAST TECHNIQUE: Multidetector CT imaging of the abdomen and pelvis was performed using the standard protocol following bolus administration of intravenous contrast. CONTRAST:  49mL OMNIPAQUE IOHEXOL 350 MG/ML SOLN COMPARISON:  04/02/2021 FINDINGS: Lower chest: Chest CTA performed few hours earlier. Improved aeration at the right base where there is diminished pleural fluid and better aerated lung. Subpleural pulmonary fibrosis. Atheromatous and cardiac calcification. Hepatobiliary: No focal liver abnormality.Cholecystectomy. Percutaneous biliary drain in stable position, reaching the duodenum. Chronic mild dilatation of intrahepatic ducts in the upper right lobe liver which may be related to catheter. Pancreas: No acute finding. Diffuse atrophy and chronic ductal dilatation towards the midline. Spleen: Unremarkable. Adrenals/Urinary Tract: Negative adrenals. No hydronephrosis or obstructing stone. Limited for detecting calculi given contrast excretion at time of imaging. Bilateral renal cortical thinning/scarring. Unremarkable  bladder. Stomach/Bowel: No obstruction. No evidence of acute inflammation. Left colonic diverticulosis. Vascular/Lymphatic: No acute vascular abnormality. Severe atheromatous calcification of the aorta and branch vessels. No mass or adenopathy. Reproductive:Unremarkable for age Other: No ascites or pneumoperitoneum. High-density nodule at the inferior right liver tip which may reflect gallstone as previously suggested. No convincing adjacent inflammation. Musculoskeletal: No acute abnormalities. Osteopenia and generalized spinal degeneration with mild scoliosis. L5 chronic pars defects with L5-S1 anterolisthesis and severe foraminal impingement. IMPRESSION: 1. No acute finding or abscess.  Stable biliary drain positioning. 2. Numerous chronic findings are described above. Electronically Signed   By: Jorje Guild M.D.   On: 04/28/2021 05:11   DG Chest Portable 1 View  Result Date: 04/13/2021 CLINICAL DATA:  Tachypnea, short of breath EXAM: PORTABLE CHEST 1 VIEW COMPARISON:  02/18/2021 FINDINGS: Single frontal view of the chest demonstrates a stable cardiac silhouette. There is persistent patchy consolidation within the right upper lobe. Chronic background interstitial scarring throughout the lungs, greatest at the bases. No effusion or pneumothorax. No acute bony abnormalities. IMPRESSION: 1. Persistent patchy right upper lobe consolidation, which may reflect pneumonia. 2. Chronic background scarring and fibrosis. Electronically Signed   By: Randa Ngo M.D.   On: 04/10/2021 20:34   US Abdomen Limited RUQ (LIVER/GB)  Result Date: 04/28/2021 CLINICAL DATA:  Recent gangrenous cholecystitis with percutaneous cholecystostomy in biliary drainage. Increased sanguinous material within the biliary drain back. EXAM: ULTRASOUND ABDOMEN LIMITED RIGHT UPPER QUADRANT COMPARISON:  Preceding abdominal CT FINDINGS: Gallbladder: History of cholecystectomy Common bile duct: Dilated at 13 mm, of doubtful significance  given normal biliary labs. There is a traversing catheter which is better seen by CT. No evidence of internal hemorrhage or stone. Liver: The biliary drain is seen traversing the liver parenchyma. No evidence of mass or collection. Portal vein is patent on color Doppler imaging with normal direction of blood flow towards the liver. IMPRESSION: 1. No acute finding. 2. Percutaneous biliary drain which is normally located by preceding CT. Electronically Signed   By: Jorje Guild M.D.   On: 04/28/2021 06:16    Microbiology Recent Results (from the past 240 hour(s))  Resp Panel  by RT-PCR (Flu A&B, Covid) Nasopharyngeal Swab     Status: None   Collection Time: 04/19/2021  8:24 PM   Specimen: Nasopharyngeal Swab; Nasopharyngeal(NP) swabs in vial transport medium  Result Value Ref Range Status   SARS Coronavirus 2 by RT PCR NEGATIVE NEGATIVE Final    Comment: (NOTE) SARS-CoV-2 target nucleic acids are NOT DETECTED.  The SARS-CoV-2 RNA is generally detectable in upper respiratory specimens during the acute phase of infection. The lowest concentration of SARS-CoV-2 viral copies this assay can detect is 138 copies/mL. A negative result does not preclude SARS-Cov-2 infection and should not be used as the sole basis for treatment or other patient management decisions. A negative result may occur with  improper specimen collection/handling, submission of specimen other than nasopharyngeal swab, presence of viral mutation(s) within the areas targeted by this assay, and inadequate number of viral copies(<138 copies/mL). A negative result must be combined with clinical observations, patient history, and epidemiological information. The expected result is Negative.  Fact Sheet for Patients:  EntrepreneurPulse.com.au  Fact Sheet for Healthcare Providers:  IncredibleEmployment.be  This test is no t yet approved or cleared by the [place] FDA and  has been  authorized for detection and/or diagnosis of SARS-CoV-2 by FDA under an Emergency Use Authorization (EUA). This EUA will remain  in effect (meaning this test can be used) for the duration of the COVID-19 declaration under Section 564(b)(1) of the Act, 21 U.S.C.section 360bbb-3(b)(1), unless the authorization is terminated  or revoked sooner.       Influenza A by PCR NEGATIVE NEGATIVE Final   Influenza B by PCR NEGATIVE NEGATIVE Final    Comment: (NOTE) The Xpert Xpress SARS-CoV-2/FLU/RSV plus assay is intended as an aid in the diagnosis of influenza from Nasopharyngeal swab specimens and should not be used as a sole basis for treatment. Nasal washings and aspirates are unacceptable for Xpert Xpress SARS-CoV-2/FLU/RSV testing.  Fact Sheet for Patients: EntrepreneurPulse.com.au  Fact Sheet for Healthcare Providers: IncredibleEmployment.be  This test is not yet approved or cleared by the [place] FDA and has been authorized for detection and/or diagnosis of SARS-CoV-2 by FDA under an Emergency Use Authorization (EUA). This EUA will remain in effect (meaning this test can be used) for the duration of the COVID-19 declaration under Section 564(b)(1) of the Act, 21 U.S.C. section 360bbb-3(b)(1), unless the authorization is terminated or revoked.  Performed at [facility], [place] [address] [place], [place] [address]   MRSA Next Gen by PCR, Nasal     Status: None   Collection Time: 04/28/21 12:29 AM   Specimen: Nasal Mucosa; Nasal Swab  Result Value Ref Range Status   MRSA by PCR Next Gen NOT DETECTED NOT DETECTED Final    Comment: (NOTE) The GeneXpert MRSA Assay (FDA approved for NASAL specimens only), is one component of a comprehensive MRSA colonization surveillance program. It is not intended to diagnose MRSA infection nor to guide or monitor treatment for MRSA infections. Test performance is not FDA approved in  patients less than 24 years old. Performed at [facility], [place] [address] [place], [place] [address]   MRSA Next Gen by PCR, Nasal     Status: None   Collection Time: 04/28/21  9:09 AM   Specimen: Nasal Mucosa; Nasal Swab  Result Value Ref Range Status   MRSA by PCR Next Gen NOT DETECTED NOT DETECTED Final    Comment: (NOTE) The GeneXpert MRSA Assay (FDA approved for NASAL specimens only), is  one component of a comprehensive MRSA colonization surveillance program. It is not intended to diagnose MRSA infection nor to guide or monitor treatment for MRSA infections. Test performance is not FDA approved in patients less than 42 years old. Performed at [facility], [place] [address] [place], [place] [address]   Culture, blood (Routine X 2) w Reflex to ID Panel     Status: None   Collection Time: 04/28/21 12:25 PM   Specimen: BLOOD  Result Value Ref Range Status   Specimen Description   Final    BLOOD LEFT ANTECUBITAL Performed at [place] [address] [place], [place] [address]    Special Requests   Final    BOTTLES DRAWN AEROBIC ONLY Blood Culture adequate volume Performed at [place] [address] [place], [place] [address]    Culture   Final    NO GROWTH 5 DAYS Performed at [facility], [place] [address] [place], [place] [address]    Report Status 05/03/2021 FINAL  Final  Culture, blood (Routine X 2) w Reflex to ID Panel     Status: None   Collection Time: 04/28/21 12:25 PM   Specimen: BLOOD  Result Value Ref Range Status   Specimen Description   Final    BLOOD LEFT ANTECUBITAL Performed at [place] [address], [place], [place] [address]    Special Requests   Final    BOTTLES DRAWN AEROBIC ONLY Blood Culture adequate volume Performed at [place] [address], [place], [place] [address]    Culture   Final    NO GROWTH 5  DAYS Performed at [facility], [place] [address], [place], [place] [address]    Report Status 05/03/2021 FINAL  Final  Urine Culture     Status: None   Collection Time: 04/28/21  5:03 PM   Specimen: Urine, Catheterized  Result Value Ref Range Status   Specimen Description   Final    URINE, CATHETERIZED Performed at [place] [address] [place], [place] [address]    Special Requests   Final    NONE Performed at [facility], [place] [address], [place], [place] [address]    Culture   Final    NO GROWTH Performed at [facility], [place] [address], [place], [place] [address]    Report Status 04/29/2021 FINAL  Final    Lab Basic Metabolic Panel: No results for input(s): NA, K, CL, CO2, GLUCOSE, BUN, CREATININE, CALCIUM, MG, PHOS in the last 168 hours. Liver Function Tests: No results for input(s): AST, ALT, ALKPHOS, BILITOT, PROT, ALBUMIN in the last 168 hours. No results for input(s): LIPASE, AMYLASE in the last 168 hours. No results for input(s): AMMONIA in the last 168 hours. CBC: No results for input(s): WBC, NEUTROABS, HGB, HCT, MCV, PLT in the last 168 hours. Cardiac Enzymes: No results for input(s): CKTOTAL, CKMB, CKMBINDEX, TROPONINI in the last 168 hours. Sepsis Labs: No results for input(s): PROCALCITON, WBC, LATICACIDVEN in the last 168 hours.  Procedures/Operations  CT head 04/12/2021 CT angiogram chest 04/28/2021 CT abdomen and pelvis 04/28/2021 Chest x-ray 04/25/2021 Abdominal ultrasound 04/28/2021   Irine Seal 05/07/2021, 5:41 PM

## 2021-06-01 NOTE — Progress Notes (Signed)
   30-May-2021 November 02, 2098  Attending [facility]  Attending Physician Notified Y  Attending Physician (First and Last Name) J. Olena Heckle. NP  Post Mortem Checklist  Date of Death 05/30/21  Time of Death 02-Nov-2112  Pronounced By [name], RN  Next of kin notified Yes  Name of next of kin notified of death [name]  Contact Person's Relationship to Patient Daughter  Contact Person's Phone Number [phone]  Contact Person's address [address] [place], [place] [address]  Family Communication Notes phoned and daughter on her way  Was the patient a No Code Blue or a Limited Code Blue? No  Did the patient die unattended? No  Patient restrained? Not applicable  Height $Remov'5\' 9"'THbZcW$  (1.753 m)  Weight 58.6 kg  Body preparation complete Y  [facility] (previously known as [employer])  Notification Date 30-May-2021  Notification Time 02-Nov-2140  [facility] Number [phone]  Is patient a potential donor? N  Autopsy  Autopsy requested by N/A  Patient and [place] Returned  Patient is satisfied that all belongings have been returned? Yes  Valuables returned to? [name] valuables returned personal belonging bag  Dermatherapy linen/gowns NOT sent with patient or transporter Disposable Patient Transfer/ Apparel Kit used  Dead on Arrival (Emergency Department)  Patient dead on arrival? No  Notifications  Patient Placement notified that Post Mortem checklist is complete Yes  Patient Placement notified body transferred Transported to [place]  Is this a medical examiner's case? [place] home name/address/phone # [facility] unknown/ undecided  Planned location of pickup [place]

## 2021-06-01 DEATH — deceased

## 2021-10-09 IMAGING — CT CT ABD-PELV W/O CM
2 of 4 series · 15 of 46 positions shown, 17 images · non-contrast
Comparison: February 22, 2021

CLINICAL DATA: Status post cholecystectomy 3 weeks ago with
subsequent removal of biliary tube by the patient which was placed
at the time of surgery.

EXAM:
CT ABDOMEN AND PELVIS WITHOUT CONTRAST
TECHNIQUE: Multidetector CT imaging of the abdomen and pelvis was performed
following the standard protocol without IV contrast.

[Series 2: axial st · axial · 0.75mm/px · z∈[+864,+1298]mm · 12 of 97 slices shown, 14 images]
[im 5/97  soft-tissue]
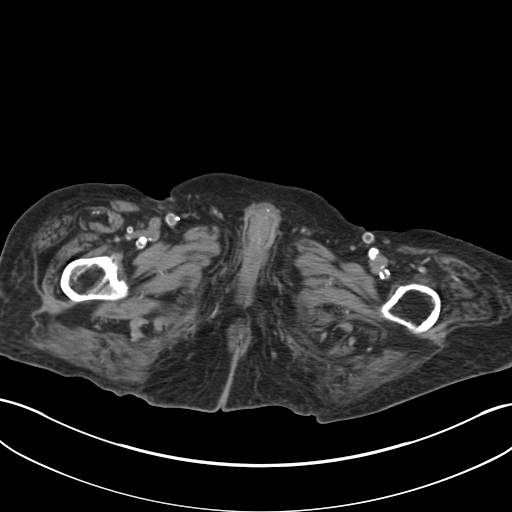
[im 5/97  bone]
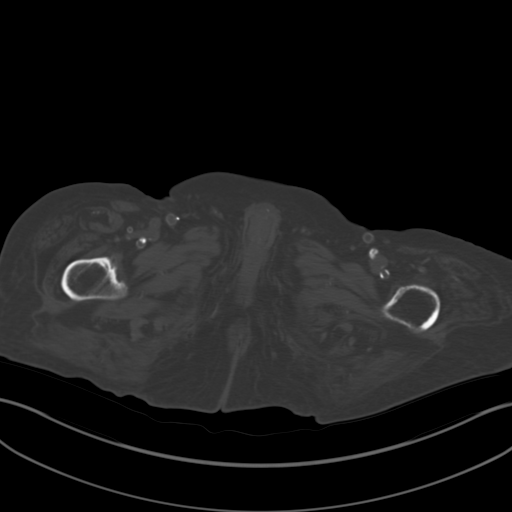
[im 15/97  soft-tissue]
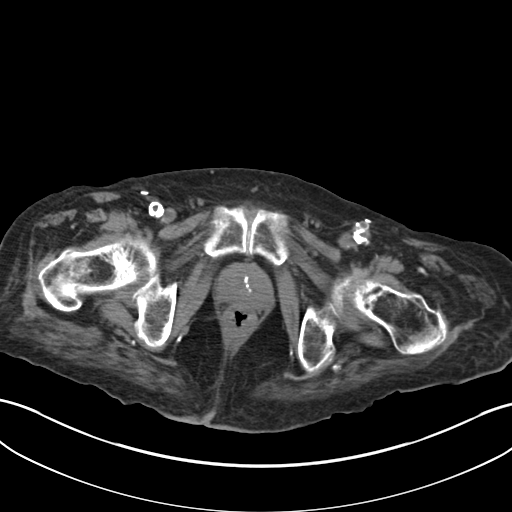
[im 20/97  soft-tissue]
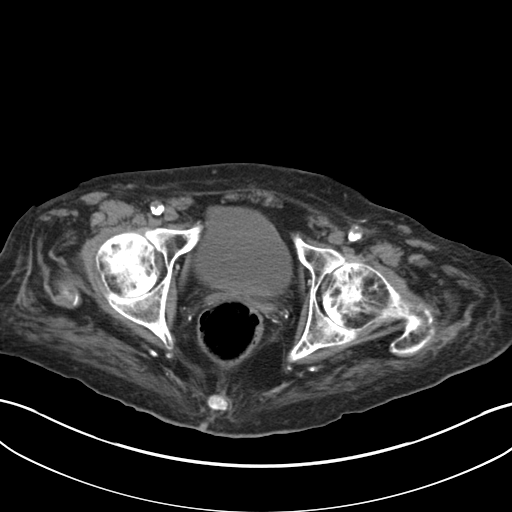
[im 29/97  soft-tissue]
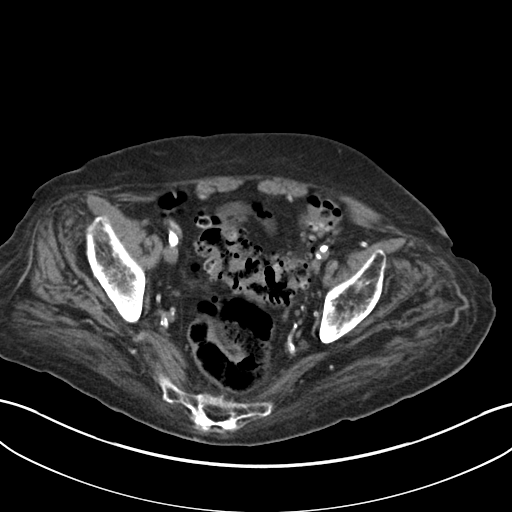
[im 39/97  soft-tissue]
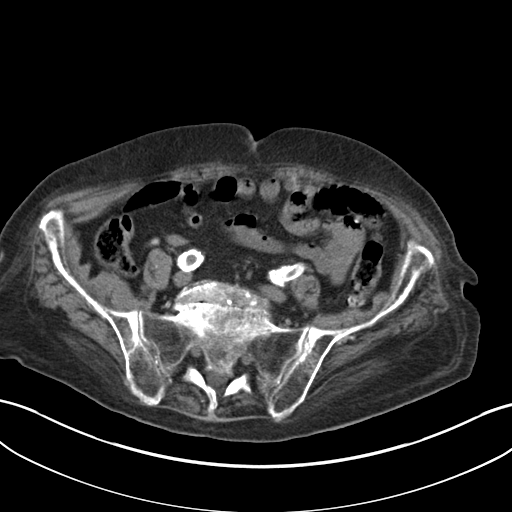
[im 44/97  soft-tissue]
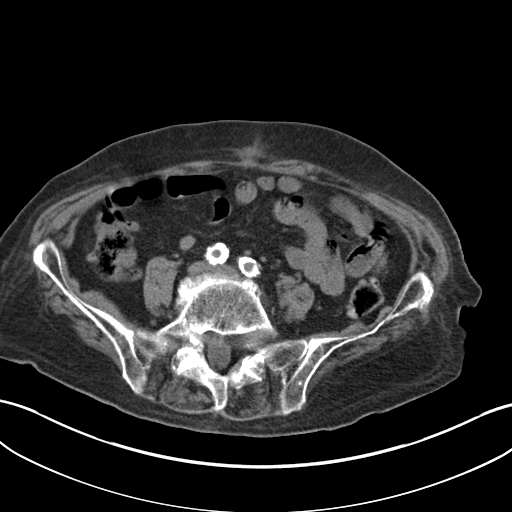
[im 53/97  soft-tissue]
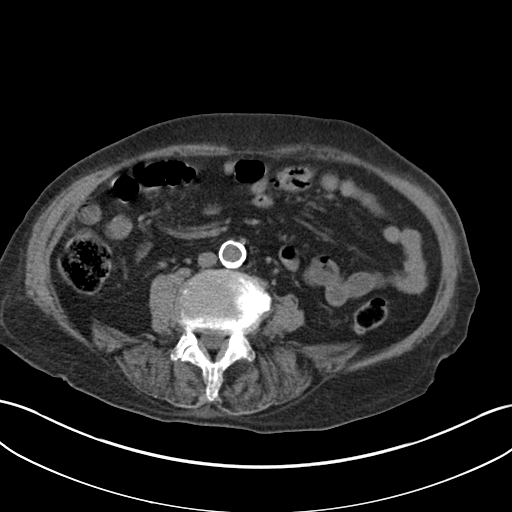
[im 58/97  soft-tissue]
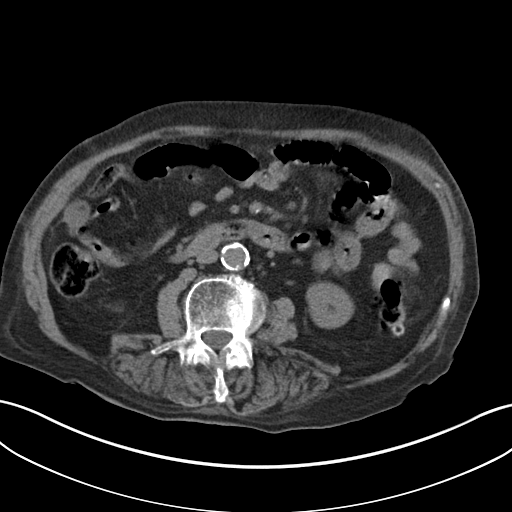
[im 68/97  soft-tissue]
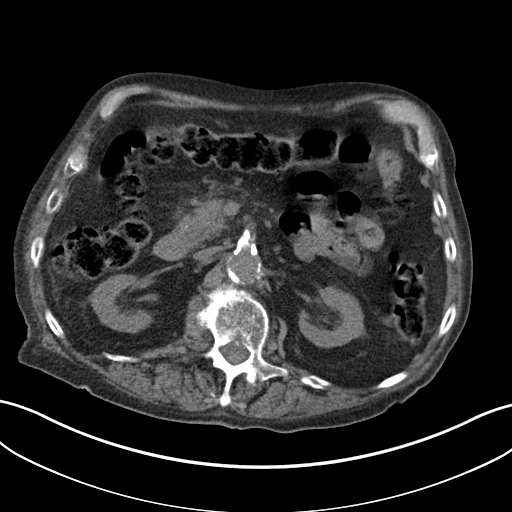
[im 68/97  bone]
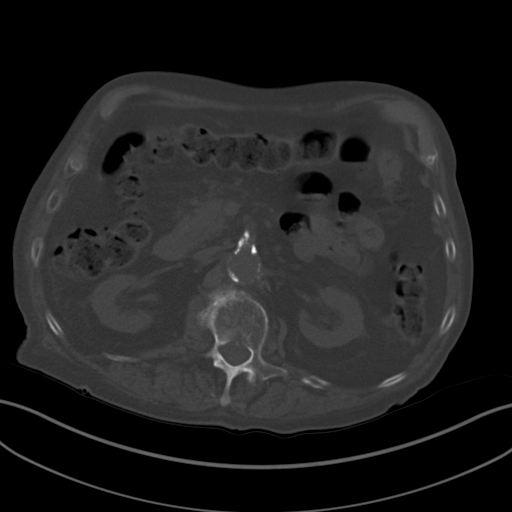
[im 77/97  soft-tissue]
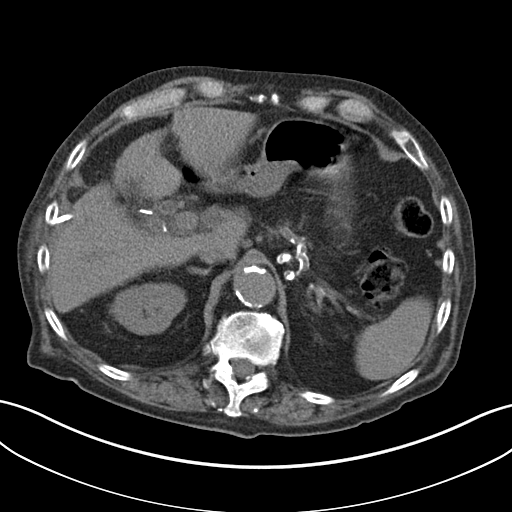
[im 82/97  soft-tissue]
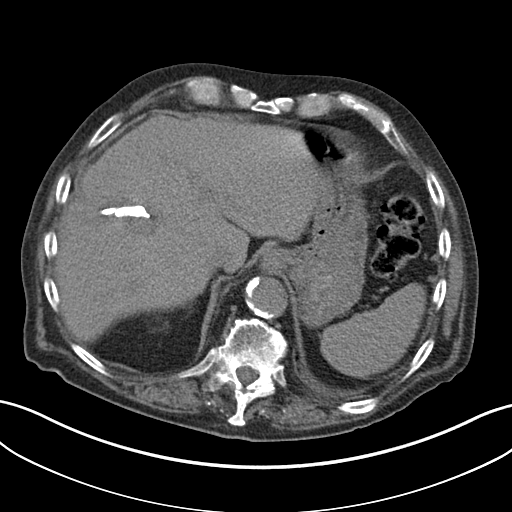
[im 92/97  soft-tissue]
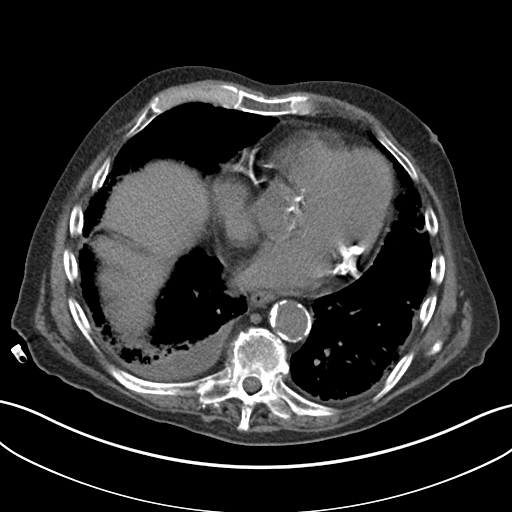

[Series 5: coronal st · coronal · 0.75mm/px · 3 of 148 slices shown]
[im 50/148  soft-tissue]
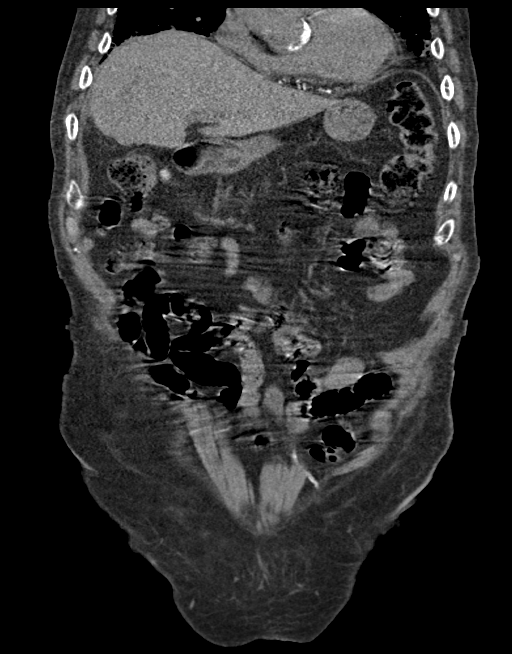
[im 66/148  soft-tissue]
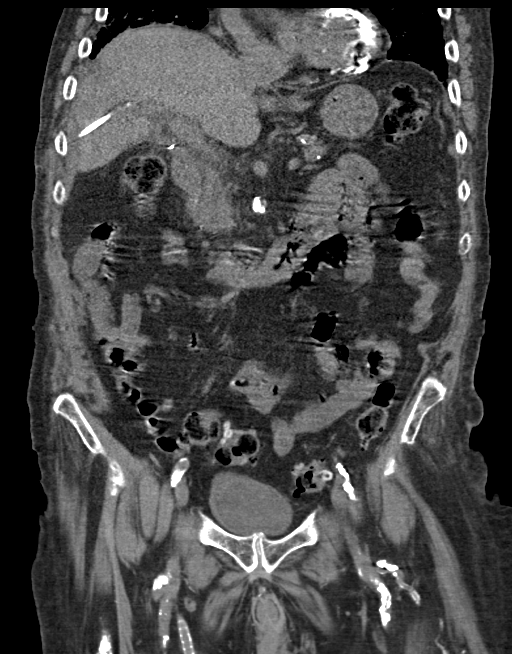
[im 82/148  soft-tissue]
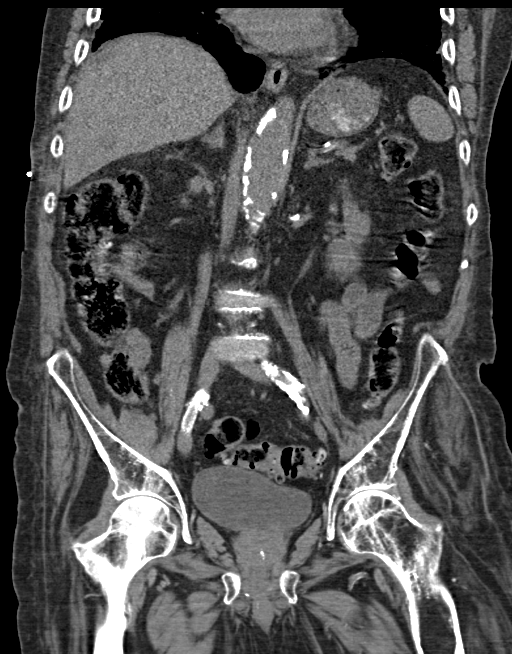

[15 of 46 positions shown; findings below may reference images not displayed]

FINDINGS: Lower chest: Marked severity chronic interstitial lung disease and
peripheral pulmonary fibrosis is noted.

There is a small right pleural effusion.

Hepatobiliary: No focal liver abnormality is seen. A percutaneous
biliary drainage catheter is seen with its distal end noted within
the right lobe of the liver, adjacent to the gallbladder fossa.
Status post cholecystectomy. No biliary dilatation.

Pancreas: Unremarkable. No pancreatic ductal dilatation or
surrounding inflammatory changes.

Spleen: Normal in size without focal abnormality.

Adrenals/Urinary Tract: Adrenal glands are unremarkable. Kidneys are
normal, without obstructing renal calculi, focal lesion, or
hydronephrosis. A 4 mm calcification is seen along the medial aspect
of the mid left kidney and may be vascular in origin. Bladder is
unremarkable.

Stomach/Bowel: Stomach is within normal limits. Appendix appears
normal. Stool is seen throughout the colon. No evidence of bowel
wall thickening, distention, or inflammatory changes. Noninflamed
diverticula are seen throughout the large bowel.

Vascular/Lymphatic: Aortic atherosclerosis. No enlarged abdominal or
pelvic lymph nodes.

Reproductive: There is mild to moderate severity prostate gland
enlargement.

Other: No abdominal wall hernia or abnormality. No abdominopelvic
ascites.

Musculoskeletal: There is grade 1 anterolisthesis of the L5
vertebral body on S1 with marked severity multilevel degenerative
changes.
IMPRESSION: 1. Percutaneous biliary drainage catheter positioning, as described
above.
2. Small right pleural effusion.
3. Marked severity chronic interstitial lung disease and peripheral
pulmonary fibrosis.
4. Colonic diverticulosis.
5. Enlarged prostate gland.
6. Grade 1 anterolisthesis of the L5 vertebral body on S1 with
marked severity multilevel degenerative changes.
7. Aortic atherosclerosis.

Aortic Atherosclerosis (550KD-[id number].D).

## 2021-10-12 IMAGING — CT CT ABD-PELV W/O CM
2 of 7 series · 15 of 46 positions shown, 17 images · non-contrast
Comparison: March 30, 2021

CLINICAL DATA: Abdominal pain acute peritonitis due to dislodged
biliary drain.

EXAM:
CT ABDOMEN AND PELVIS WITHOUT CONTRAST
TECHNIQUE: Multidetector CT imaging of the abdomen and pelvis was performed
following the standard protocol without IV contrast.

[Series 2: axial st · axial · 0.91mm/px · z∈[+514,+949]mm · 12 of 103 slices shown, 14 images]
[im 8/103  soft-tissue]
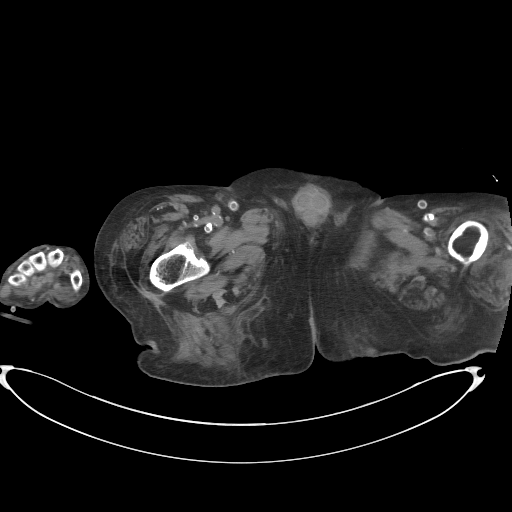
[im 8/103  bone]
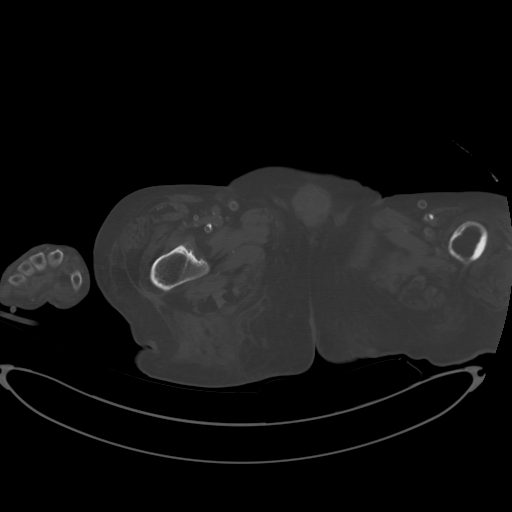
[im 16/103  soft-tissue]
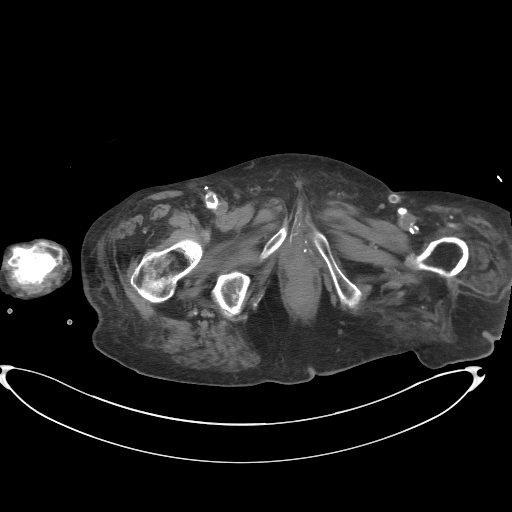
[im 24/103  soft-tissue]
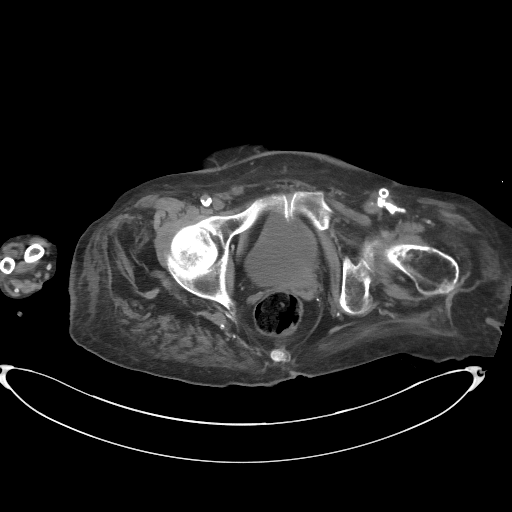
[im 32/103  soft-tissue]
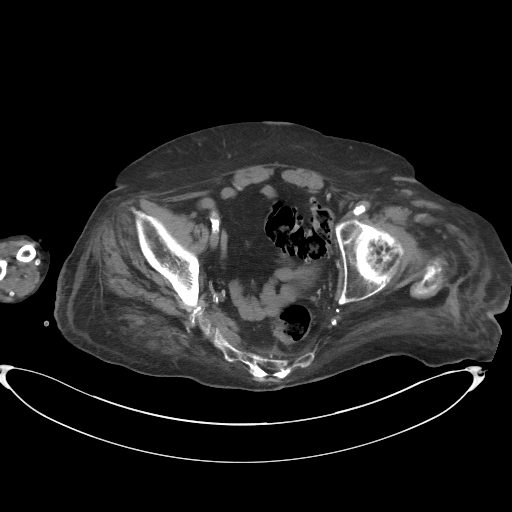
[im 40/103  soft-tissue]
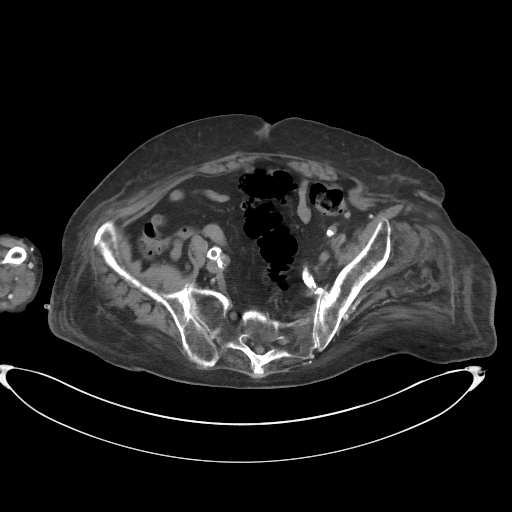
[im 48/103  soft-tissue]
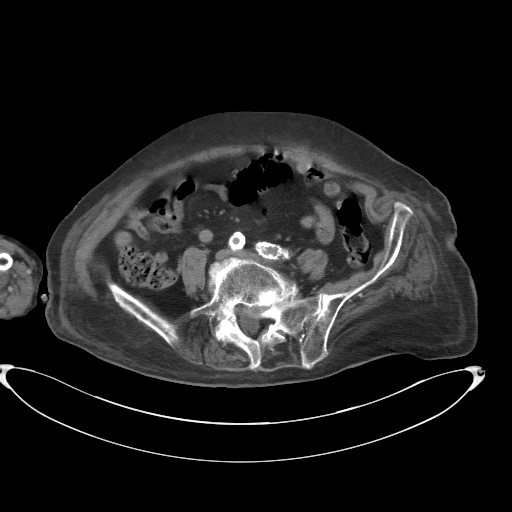
[im 55/103  soft-tissue]
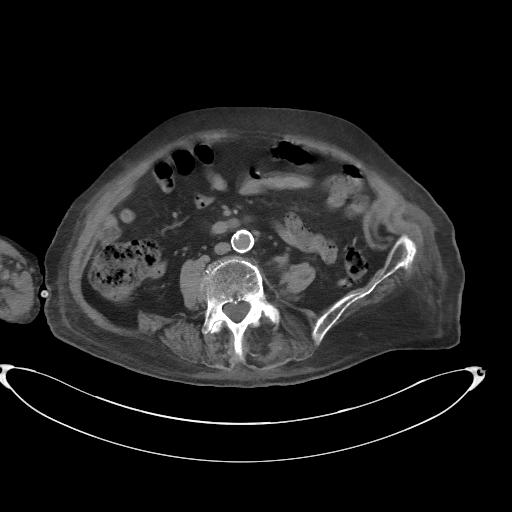
[im 63/103  soft-tissue]
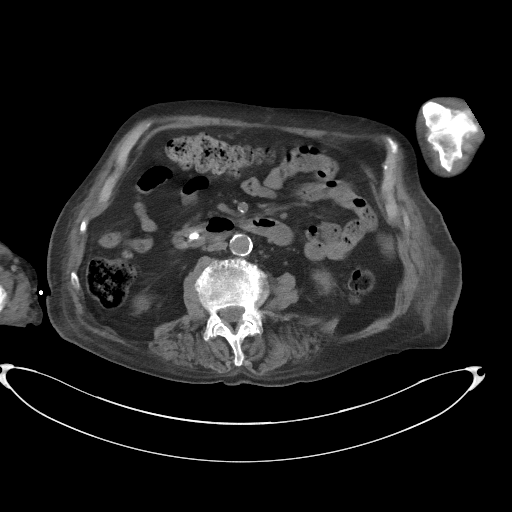
[im 71/103  soft-tissue]
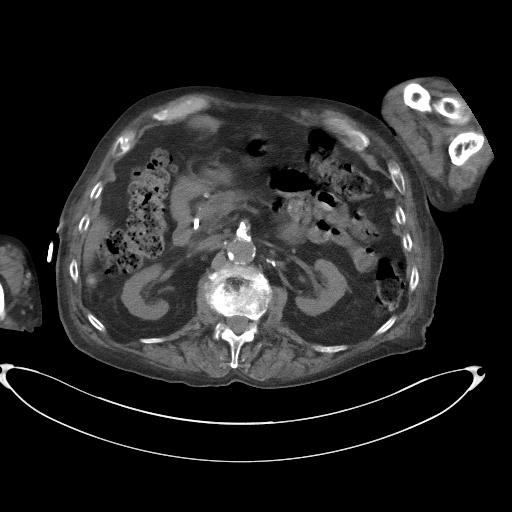
[im 71/103  bone]
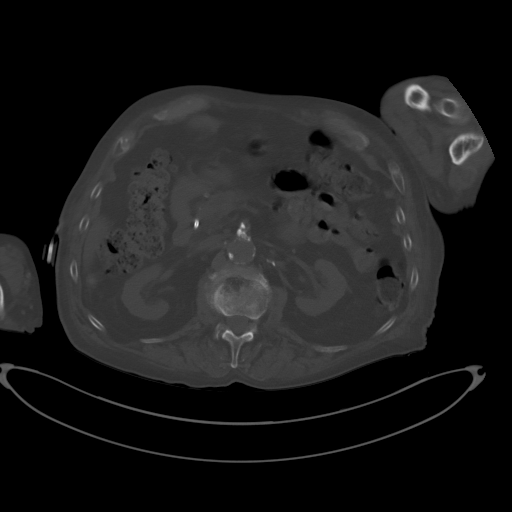
[im 79/103  soft-tissue]
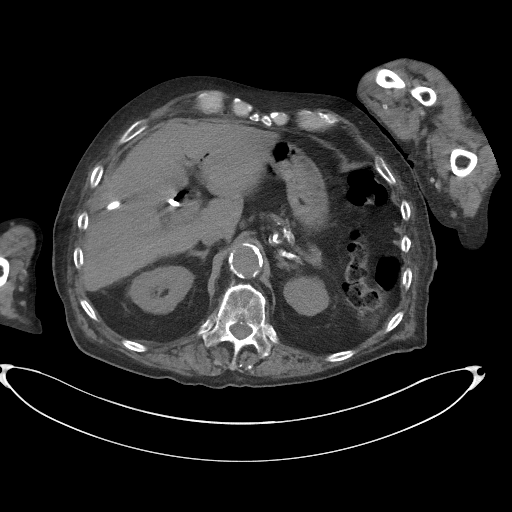
[im 87/103  soft-tissue]
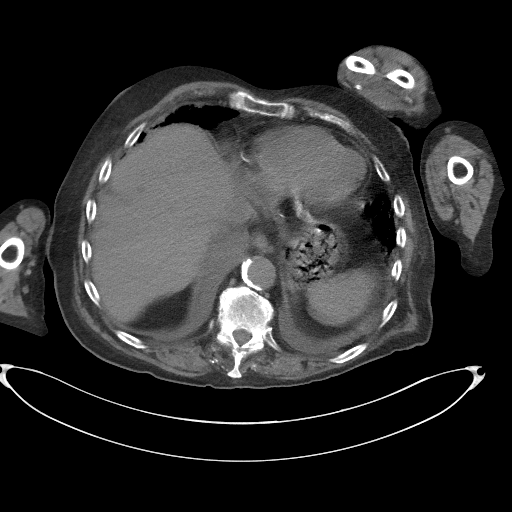
[im 95/103  soft-tissue]
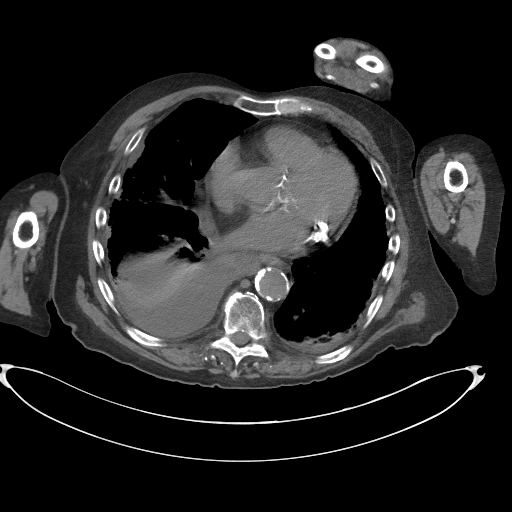

[Series 7: coronal st · coronal · 1.05mm/px · 3 of 102 slices shown]
[im 26/102  soft-tissue]
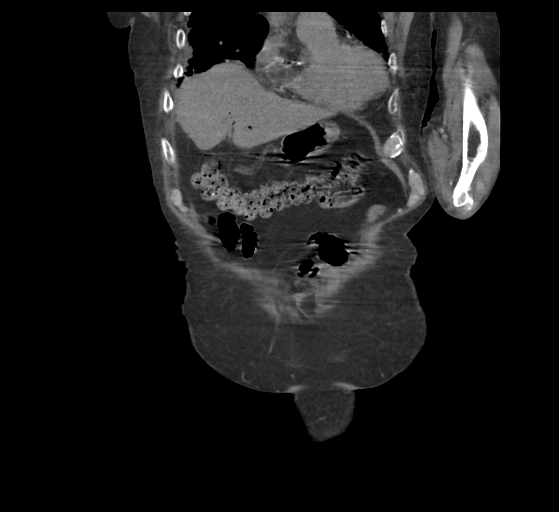
[im 51/102  soft-tissue]
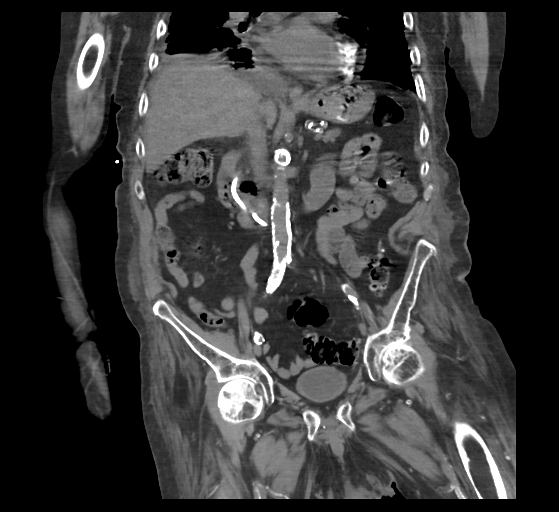
[im 76/102  soft-tissue]
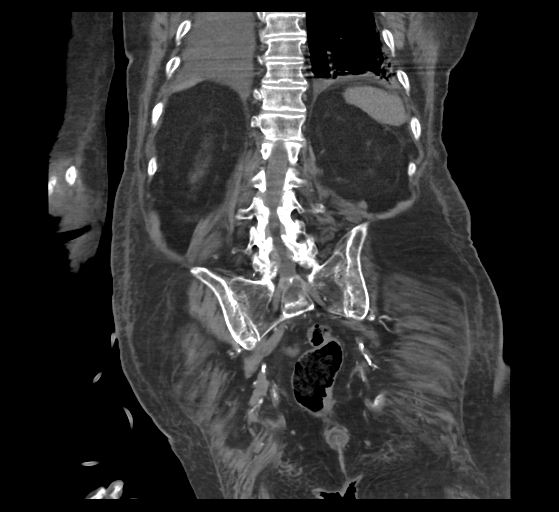

[15 of 46 positions shown; findings below may reference images not displayed]

FINDINGS: Lower chest: Right basilar consolidation with small right pleural
effusion. Tiny left pleural effusion. Bibasilar fibrotic lung
changes. Aortic atherosclerosis. Coronary artery calcifications.
Mitral annulus calcifications.

Hepatobiliary: Unremarkable noncontrast appearance of the hepatic
parenchyma. Pneumobilia with percutaneous biliary drainage catheter
and pigtail coiled in the duodenum. Postsurgical change of
cholecystectomy with small volume of fluid in the surgical bed.
Central area of high density on image with surrounding soft tissue
and mild inflammation measuring 11 mm along the inferior margin of
the right lobe of the liver 32/2, slightly more prominent than on
prior CT and new from CT February 18, 2021, possibly reflecting
inflammation about a dropped gallstone. No walled off fluid
collections.

Pancreas: Scattered dystrophic pancreatic calcifications sequela of
chronic pancreatitis. No pancreatic ductal dilation.

Spleen: Within normal limits.

Adrenals/Urinary Tract: Bilateral adrenal glands are unremarkable.
No hydronephrosis. Calcifications in the bilateral renal hila are
favored to be vascular in nature. Urinary bladder is unremarkable
for degree of distension.

Stomach/Bowel: Stomach is unremarkable for degree of distension. No
pathologic dilation of small or large bowel. The appendix and
terminal ileum appear normal. Colonic diverticulosis without
findings of acute diverticulitis.

Vascular/Lymphatic: Severe aortic and branch vessel atherosclerosis
without abdominal aortic aneurysm. No pathologically enlarged
abdominal or pelvic lymph nodes.

Reproductive: Dystrophic prostatic calcifications within an enlarged
prostate gland.

Other: No significant abdominopelvic ascites or peritoneal
thickening. No pneumoperitoneum.

Musculoskeletal: Severe multilevel degenerative changes spine.
Chronic bilateral L5 pars defects with grade 1 L5 on S1
anterolisthesis. Degenerative changes bilateral hips. Degenerative
changes bilateral SI joints. No acute osseous abnormality.
IMPRESSION: 1. Postsurgical change of cholecystectomy with similar stranding in
the surgical bed. No walled off fluid collections.
2. Central area of high density with surrounding soft tissue and
mild inflammation measuring 11 mm along the inferior margin of the
right lobe of the liver, slightly more prominent than on prior CT
and new from CT February 18, 2021, possibly reflecting inflammation
about a dropped gallstone.
3. No significant abdominopelvic ascites or peritoneal thickening
visualized.
4. Similar positioning of the percutaneous biliary drainage catheter
with pigtail coiled in the duodenum.
5. Small right greater than left pleural effusions with a right
basilar consolidation possibly reflecting infection or atelectasis.
6. Colonic diverticulosis without findings of acute diverticulitis.
7.  Aortic Atherosclerosis (72GMY-EEE.E).
